# Patient Record
Sex: Male | Born: 1935 | Race: Black or African American | Hispanic: No | Marital: Married | Smoking: Never smoker
Health system: Southern US, Community
[De-identification: ages and names within clinical notes are randomized; demographics above are authoritative.]

## PROBLEM LIST (undated history)

## (undated) DIAGNOSIS — F0390 Unspecified dementia without behavioral disturbance: Secondary | ICD-10-CM

---

## 2017-10-05 ENCOUNTER — Encounter: Payer: Self-pay | Admitting: Internal Medicine

## 2020-07-08 ENCOUNTER — Ambulatory Visit: Payer: Medicare HMO | Admitting: Neurology

## 2020-08-28 ENCOUNTER — Ambulatory Visit: Payer: Medicare HMO | Admitting: Neurology

## 2021-03-14 ENCOUNTER — Emergency Department (HOSPITAL_COMMUNITY): Payer: Medicare HMO

## 2021-03-14 ENCOUNTER — Other Ambulatory Visit: Payer: Self-pay

## 2021-03-14 ENCOUNTER — Emergency Department (HOSPITAL_COMMUNITY)
Admission: EM | Admit: 2021-03-14 | Discharge: 2021-03-14 | Discharge status: Against Medical Advice | Payer: Medicare HMO | Attending: Emergency Medicine | Admitting: Emergency Medicine

## 2021-03-14 ENCOUNTER — Encounter (HOSPITAL_COMMUNITY): Payer: Self-pay

## 2021-03-14 DIAGNOSIS — Z20822 Contact with and (suspected) exposure to covid-19: Secondary | ICD-10-CM | POA: Diagnosis not present

## 2021-03-14 DIAGNOSIS — R55 Syncope and collapse: Secondary | ICD-10-CM | POA: Insufficient documentation

## 2021-03-14 DIAGNOSIS — R031 Nonspecific low blood-pressure reading: Secondary | ICD-10-CM

## 2021-03-14 DIAGNOSIS — Y92009 Unspecified place in unspecified non-institutional (private) residence as the place of occurrence of the external cause: Secondary | ICD-10-CM | POA: Diagnosis not present

## 2021-03-14 DIAGNOSIS — I493 Ventricular premature depolarization: Secondary | ICD-10-CM

## 2021-03-14 DIAGNOSIS — R4182 Altered mental status, unspecified: Secondary | ICD-10-CM | POA: Diagnosis not present

## 2021-03-14 DIAGNOSIS — W19XXXA Unspecified fall, initial encounter: Secondary | ICD-10-CM | POA: Diagnosis not present

## 2021-03-14 DIAGNOSIS — F0390 Unspecified dementia without behavioral disturbance: Secondary | ICD-10-CM | POA: Diagnosis not present

## 2021-03-14 DIAGNOSIS — R7989 Other specified abnormal findings of blood chemistry: Secondary | ICD-10-CM

## 2021-03-14 LAB — COMPREHENSIVE METABOLIC PANEL
ALT: 951 U/L — ABNORMAL HIGH (ref 0–44)
AST: 1997 U/L — ABNORMAL HIGH (ref 15–41)
Albumin: 3 g/dL — ABNORMAL LOW (ref 3.5–5.0)
Alkaline Phosphatase: 72 U/L (ref 38–126)
Anion gap: 6 (ref 5–15)
BUN: 20 mg/dL (ref 8–23)
CO2: 29 mmol/L (ref 22–32)
Calcium: 9.4 mg/dL (ref 8.9–10.3)
Chloride: 104 mmol/L (ref 98–111)
Creatinine, Ser: 1.6 mg/dL — ABNORMAL HIGH (ref 0.61–1.24)
GFR, Estimated: 42 mL/min — ABNORMAL LOW (ref >60)
Glucose, Bld: 111 mg/dL — ABNORMAL HIGH (ref 70–99)
Potassium: 3.9 mmol/L (ref 3.5–5.1)
Sodium: 139 mmol/L (ref 135–145)
Total Bilirubin: 2.2 mg/dL — ABNORMAL HIGH (ref 0.3–1.2)
Total Protein: 5.8 g/dL — ABNORMAL LOW (ref 6.5–8.1)

## 2021-03-14 LAB — CBC
HCT: 39.1 % (ref 39.0–52.0)
Hemoglobin: 12.8 g/dL — ABNORMAL LOW (ref 13.0–17.0)
MCH: 28.7 pg (ref 26.0–34.0)
MCHC: 32.7 g/dL (ref 30.0–36.0)
MCV: 87.7 fL (ref 80.0–100.0)
Platelets: UNDETERMINED 10*3/uL (ref 150–400)
RBC: 4.46 MIL/uL (ref 4.22–5.81)
RDW: 14.2 % (ref 11.5–15.5)
WBC: 5.3 10*3/uL (ref 4.0–10.5)
nRBC: 0 % (ref 0.0–0.2)

## 2021-03-14 LAB — URINALYSIS, ROUTINE W REFLEX MICROSCOPIC
Bacteria, UA: NONE SEEN
Bilirubin Urine: NEGATIVE
Glucose, UA: NEGATIVE mg/dL
Ketones, ur: NEGATIVE mg/dL
Leukocytes,Ua: NEGATIVE
Nitrite: NEGATIVE
Protein, ur: NEGATIVE mg/dL
Specific Gravity, Urine: 1.006 (ref 1.005–1.030)
pH: 8 (ref 5.0–8.0)

## 2021-03-14 LAB — RESP PANEL BY RT-PCR (FLU A&B, COVID) ARPGX2
Influenza A by PCR: NEGATIVE
Influenza B by PCR: NEGATIVE
SARS Coronavirus 2 by RT PCR: NEGATIVE

## 2021-03-14 MED ORDER — SODIUM CHLORIDE 0.9 % IV BOLUS
1000.0000 mL | Freq: Once | INTRAVENOUS | Status: AC
  Administered 2021-03-14: 1000 mL via INTRAVENOUS

## 2021-03-14 NOTE — ED Notes (Signed)
PA Badalamente at bedside.

## 2021-03-14 NOTE — ED Notes (Signed)
Attempted having the pt and pt's family sign the electronic AMA form. Pt's spouse refused to sign it.

## 2021-03-14 NOTE — ED Provider Notes (Addendum)
[facility] EMERGENCY DEPARTMENT Provider Note   CSN: [id number] Arrival date & time: 03/14/21  0818     History Chief Complaint  Patient presents with   Syncope   Fall    Edwin [name] is a 87 y.o. male.  Pt with hx dementia, presents after fall at home, +syncopal event. Spouse hear him fall - was walking in other room, grabbed chair, chair tipped over and pt fell. When she got to him he was conscious. EMS was called, when helping patient up, became faint, unresponsive for ~ 1 minute. No seizure activity. By time on monitor, EMS noted multiple pvcs. Pt came to, and mental status reported as c/w baseline per spouse. EMS notes initial bp soft, in mid 80 to mid 90 range - iv was placed, but patient inadvertently pulled out in route. Pt limited historian - dementia - level 5 caveat. Spouse denies any other recent falls or syncope. Denies hx cad, or dysrhythmia. States something similar happened a long time ago 'and he was dehydrated....got better w fluids'. Spouse indicates recent health at baseline/well. Has been eating/drinking. No wt loss. No med use. No fever. No c/o of any pain. No trouble breathing. No cough or uri symptoms. No gu c/o. Nvd.   The history is provided by the patient, the EMS personnel and medical records. The history is limited by the condition of the patient.      History reviewed. No pertinent past medical history.  There are no problems to display for this patient.   History reviewed. No pertinent surgical history.     History reviewed. No pertinent family history.     Home Medications Prior to Admission medications   Not on File    Allergies    Patient has no known allergies.  Review of Systems   Review of Systems  Unable to perform ROS: Dementia  Constitutional:  Negative for fever.  Neurological:  Positive for syncope.  Level 5 caveat, dementia   Physical Exam Updated Vital Signs BP 119/72   Pulse 98   Temp 99.5 F  (37.5 C)   Resp 18   SpO2 99%   Physical Exam Vitals and nursing note reviewed.  Constitutional:      Appearance: Normal appearance. He is well-developed.  HENT:     Head: Atraumatic.     Nose: Nose normal.     Mouth/Throat:     Mouth: Mucous membranes are moist.     Pharynx: Oropharynx is clear.  Eyes:     General: No scleral icterus.    Conjunctiva/sclera: Conjunctivae normal.     Pupils: Pupils are equal, round, and reactive to light.  Neck:     Vascular: No carotid bruit.     Trachea: No tracheal deviation.     Comments: No stiffness or rigidity.  Cardiovascular:     Rate and Rhythm: Normal rate and regular rhythm.     Pulses: Normal pulses.     Heart sounds: Normal heart sounds. No murmur heard.   No friction rub. No gallop.  Pulmonary:     Effort: Pulmonary effort is normal. No accessory muscle usage or respiratory distress.     Breath sounds: Normal breath sounds.  Chest:     Chest wall: No tenderness.  Abdominal:     General: Bowel sounds are normal. There is no distension.     Palpations: Abdomen is soft.     Tenderness: There is no abdominal tenderness.     Comments: No abd  bruising, contusion, pain or tenderness.   Genitourinary:    Comments: No cva tenderness. Musculoskeletal:        General: No swelling.     Cervical back: Normal range of motion and neck supple. No rigidity or tenderness.     Comments: CTLS spine, non tender, aligned, no step off. Good rom bil extremities without pain or focal bony tenderness.   Skin:    General: Skin is warm and dry.     Findings: No rash.  Neurological:     Mental Status: He is alert.     Comments: Awake and alert. No dysarthria. Baseline dementia - indicates pts mental status and functional ability appears c/w baseline. Motor intact bil, stre 5/5. Sens grossly intact.   Psychiatric:        Mood and Affect: Mood normal.    ED Results / Procedures / Treatments   Labs (all labs ordered are listed, but only  abnormal results are displayed) Results for orders placed or performed during the hospital encounter of 03/14/21  Resp Panel by RT-PCR (Flu A&B, Covid) Nasopharyngeal Swab   Specimen: Nasopharyngeal Swab; Nasopharyngeal(NP) swabs in vial transport medium  Result Value Ref Range   SARS Coronavirus 2 by RT PCR NEGATIVE NEGATIVE   Influenza A by PCR NEGATIVE NEGATIVE   Influenza B by PCR NEGATIVE NEGATIVE  CBC  Result Value Ref Range   WBC 5.3 4.0 - 10.5 K/uL   RBC 4.46 4.22 - 5.81 MIL/uL   Hemoglobin 12.8 (L) 13.0 - 17.0 g/dL   HCT 16.0 10.9 - 32.3 %   MCV 87.7 80.0 - 100.0 fL   MCH 28.7 26.0 - 34.0 pg   MCHC 32.7 30.0 - 36.0 g/dL   RDW 55.7 32.2 - 02.5 %   Platelets PLATELET CLUMPS NOTED ON SMEAR, UNABLE TO ESTIMATE 150 - 400 K/uL   nRBC 0.0 0.0 - 0.2 %  Comprehensive metabolic panel  Result Value Ref Range   Sodium 139 135 - 145 mmol/L   Potassium 3.9 3.5 - 5.1 mmol/L   Chloride 104 98 - 111 mmol/L   CO2 29 22 - 32 mmol/L   Glucose, Bld 111 (H) 70 - 99 mg/dL   BUN 20 8 - 23 mg/dL   Creatinine, Ser 4.27 (H) 0.61 - 1.24 mg/dL   Calcium 9.4 8.9 - 06.2 mg/dL   Total Protein 5.8 (L) 6.5 - 8.1 g/dL   Albumin 3.0 (L) 3.5 - 5.0 g/dL   AST 3,762 (H) 15 - 41 U/L   ALT 951 (H) 0 - 44 U/L   Alkaline Phosphatase 72 38 - 126 U/L   Total Bilirubin 2.2 (H) 0.3 - 1.2 mg/dL   GFR, Estimated 42 (L) >60 mL/min   Anion gap 6 5 - 15  Urinalysis, Routine w reflex microscopic Urine, Clean Catch  Result Value Ref Range   Color, Urine YELLOW YELLOW   APPearance CLEAR CLEAR   Specific Gravity, Urine 1.006 1.005 - 1.030   pH 8.0 5.0 - 8.0   Glucose, UA NEGATIVE NEGATIVE mg/dL   Hgb urine dipstick SMALL (A) NEGATIVE   Bilirubin Urine NEGATIVE NEGATIVE   Ketones, ur NEGATIVE NEGATIVE mg/dL   Protein, ur NEGATIVE NEGATIVE mg/dL   Nitrite NEGATIVE NEGATIVE   Leukocytes,Ua NEGATIVE NEGATIVE   RBC / HPF 0-5 0 - 5 RBC/hpf   WBC, UA 0-5 0 - 5 WBC/hpf   Bacteria, UA NONE SEEN NONE SEEN   CT HEAD WO  CONTRAST ( )  Result Date: 03/14/2021 CLINICAL  DATA:  Head trauma.  Status post fall EXAM: CT HEAD WITHOUT CONTRAST TECHNIQUE: Contiguous axial images were obtained from the base of the skull through the vertex without intravenous contrast. COMPARISON:  None FINDINGS: Brain: No evidence of acute infarction, hemorrhage, hydrocephalus, extra-axial collection or mass lesion/mass effect. Prominence of the sulci and ventricles identified compatible with brain atrophy. There is mild diffuse low-attenuation within the subcortical and periventricular white matter compatible with chronic microvascular disease. Vascular: No hyperdense vessel or unexpected calcification. Skull: Normal. Negative for fracture or focal lesion. Sinuses/Orbits: No acute finding. Other: None. IMPRESSION: 1. No acute intracranial abnormalities. 2. Chronic small vessel ischemic change and brain atrophy. Electronically Signed   By: Signa Kell M.D.   On: 03/14/2021 13:37    EKG EKG Interpretation  Date/Time:  Friday March 14 2021 08:29:56 EDT Ventricular Rate:  103 PR Interval:  182 QRS Duration: 87 QT Interval:  347 QTC Calculation: 455 R Axis:   0 Text Interpretation: Sinus tachycardia No previous tracing Confirmed by Cathren Laine ([id number]) on 03/14/2021 8:47:55 AM  Radiology CT HEAD WO CONTRAST ( )  Result Date: 03/14/2021 CLINICAL DATA:  Head trauma.  Status post fall EXAM: CT HEAD WITHOUT CONTRAST TECHNIQUE: Contiguous axial images were obtained from the base of the skull through the vertex without intravenous contrast. COMPARISON:  None FINDINGS: Brain: No evidence of acute infarction, hemorrhage, hydrocephalus, extra-axial collection or mass lesion/mass effect. Prominence of the sulci and ventricles identified compatible with brain atrophy. There is mild diffuse low-attenuation within the subcortical and periventricular white matter compatible with chronic microvascular disease. Vascular: No hyperdense vessel or  unexpected calcification. Skull: Normal. Negative for fracture or focal lesion. Sinuses/Orbits: No acute finding. Other: None. IMPRESSION: 1. No acute intracranial abnormalities. 2. Chronic small vessel ischemic change and brain atrophy. Electronically Signed   By: Signa Kell M.D.   On: 03/14/2021 13:37    Procedures Procedures   Medications Ordered in ED Medications  sodium chloride 0.9 % bolus 1,000 mL (0 mLs Intravenous Stopped 03/14/21 1104)    ED Course  I have reviewed the triage vital signs and the nursing notes.  Pertinent labs & imaging results that were available during my care of the patient were reviewed by me and considered in my medical decision making (see chart for details).    MDM Rules/Calculators/A&P                           Iv ns bolus. Continuous pulse ox and cardiac monitoring. Ecg. Stat labs.   Reviewed nursing notes and prior charts for additional history.   Labs reviewed/interpreted by me - AST/ALT markedly high. No hx same. Denies hx etoh abuse, denies acetaminophen/tylenol use, denies abd pain or nv, denies hx liver disease/hepatitis.  Will add full liver function panel, hepatitis labs, get u/s.  CT reviewed/interpreted by me - no hem.   Given syncope x 2, weakness/soft bp, elevated lfts, will admit to medicine service.  Medicine consulted for admission.   Discussed plan of admission w pt/spouse,  - pt/spouse indicates not willing to stay in ER, hospital or bed admitted, and requests d/c. I discussed reasons for recommending and needing admission, including syncope x 2, earlier frequent pvcs and soft blood pressure, and possibility cardiac cause of syncope, and risk of injury, and/or death. I also discussed grossly abnormal LFTS with pt/spouse, and risk of liver failure, hepatitis, cholecystitis or other infection, and worsening liver failure, infection, or death. Spouse indicates her  understanding of these issues, states given his age, condition,  dementia, etc, does not want longer ER stay or admission. She indicates he/they will follow up with primary care doctor. Encourage if reconsiders and is willing or wanting to be admitted to return right away. Pt/spouse requesting to leave/AMA.      Final Clinical Impression(s) / ED Diagnoses Final diagnoses:  None    Rx / DC Orders ED Discharge Orders     None         Cathren Laine, MD 03/14/21 1351     Cathren Laine, MD 03/14/21 [id number]

## 2021-03-14 NOTE — Discharge Instructions (Addendum)
It was our pleasure to provide your ER care today - we hope that you feel better. Make sure to drink adequate fluids and stay well hydrated.   Please understand that we are recommending admission to the hospital. Given your presentation and test results, there is risk of heart rhythm problem, fainting, falling and injury, worsening  of liver disease/function, infection, and death.  Return if reconsider and are willing to be admitted.   From today's labs, your kidney function test is mildly high (creatinine 1.6), and liver function tests are very high (AST 1997, ALT 951, bili 2.2).  follow up with your doctor (Dr Knox Royalty) today or tomorrow AM - discuss today's symptoms and these results with him.   Return to ER right away if worse, fainting, chest pain, trouble breathing, severe abdominal pain, fevers, or other concern.

## 2021-03-14 NOTE — ED Triage Notes (Signed)
Pt BIB from home by [facility] d/t initial call out as a "respiratory issue" upon their arrival to residence pt was laying in the floor d/t a unwitnessed fall & was lying supine in the floor with his head against the wall. Wife reports to EMS that she did not want to bring pt to hospital so that he will not "get covid." EMS reports that pt had a syncopal episode when they were getting him out of the floor. BP was 86/55 (65) & pulses 105 during this event, EMS reports he hada left sided gaze, drooling & they were unable to find a radial pulse. The syncopal episode lasted 30-40 seconds on scene (per EMS)While en route to ED pt pulled out his 18G PIV in his Lt FA after he was able to receive 500 cc NS blous, his BP did come up to 97/56 (70) & pulse 102. Upon arrival to ED pt is back to baseline, wife at bedside. CBG 140.

## 2021-06-07 ENCOUNTER — Other Ambulatory Visit: Payer: Self-pay

## 2021-06-07 ENCOUNTER — Emergency Department (HOSPITAL_COMMUNITY): Payer: Medicare HMO

## 2021-06-07 ENCOUNTER — Encounter (HOSPITAL_COMMUNITY): Payer: Self-pay

## 2021-06-07 ENCOUNTER — Emergency Department (HOSPITAL_COMMUNITY)
Admission: EM | Admit: 2021-06-07 | Discharge: 2021-06-07 | Disposition: A | Discharge status: Home / Self Care | Payer: Medicare HMO | Attending: Emergency Medicine | Admitting: Emergency Medicine

## 2021-06-07 DIAGNOSIS — F0390 Unspecified dementia without behavioral disturbance: Secondary | ICD-10-CM | POA: Diagnosis not present

## 2021-06-07 DIAGNOSIS — R464 Slowness and poor responsiveness: Secondary | ICD-10-CM | POA: Diagnosis not present

## 2021-06-07 DIAGNOSIS — Z5321 Procedure and treatment not carried out due to patient leaving prior to being seen by health care provider: Secondary | ICD-10-CM | POA: Diagnosis not present

## 2021-06-07 DIAGNOSIS — R55 Syncope and collapse: Secondary | ICD-10-CM | POA: Diagnosis not present

## 2021-06-07 MED ORDER — IOHEXOL 350 MG/ML SOLN
80.0000 mL | Freq: Once | INTRAVENOUS | Status: AC | PRN
  Administered 2021-06-07: 80 mL via INTRAVENOUS

## 2021-06-07 NOTE — ED Provider Notes (Cosign Needed Addendum)
Emergency Medicine Provider Triage Evaluation Note  Edwin [name] , a 87 y.o. male  was evaluated in triage.  Pt complains of syncope.  Happened previously few weeks ago as well this evening.  Today was on  toilet having a bowel movement, found unresponsive.  When fire got there patient was believed to pulseless? Upon EMS arrival patient had pulses however was hypotensive into the 70s systolic. Given IVF. No chronic medical problems per family.  No melena or blood per rectum.  He is at his baseline mentation.  Patient denies any pain or complaints.  No recent illnesses per wife.  No HA, CP, SOB, back pain, weakness  Seen for syncope with concern for cardiac etiology however declined admission at that time and left AMA  Review of Systems  Positive: syncope Negative: HA, CP, SOB, bloody stool  Physical Exam  SpO2 95%  Gen:   Awake, no distress   Resp:  Normal effort  MSK:   Moves extremities without difficulty  Other:    Medical Decision Making  Medically screening exam initiated at 10:35 PM.  Appropriate orders placed.  Raul Torrance was informed that the remainder of the evaluation will be completed by another provider, this initial triage assessment does not replace that evaluation, and the importance of remaining in the ED until their evaluation is complete.  Syncope  Nursing aware patient needs room in back   ADDEND: Family stating they are leaving against medical advice.  They do not want any work-up done.  Understand risk versus leaving to include death however continue to choose to leave the emergency department. They state they did not want patient brought here by EMS to begin with.  We discussed the nature and purpose, risks and benefits, as well as, the alternatives of treatment. Time was given to allow the opportunity to ask questions and consider their options, and after the discussion, the patient decided to refuse the offerred treatment. The patient was informed that  refusal could lead to, but was not limited to, death, permanent disability, or severe pain. If present, I asked the relatives or significant others to dissuade them without success. Prior to refusing, I determined that the patient had the capacity to make their decision and understood the consequences of that decision. After refusal, I made every reasonable opportunity to treat them to the best of my ability.  The patient was notified that they may return to the emergency department at any time for further treatment.       Jaelyne Deeg A, PA-C 06/07/21 2256

## 2021-06-07 NOTE — ED Notes (Addendum)
Pt family members refusing blood work, EKG and other diagnostics. They sts he has had this happen before. Insurance will not cover repeat blood work. Family report they will take him to [facility] and see what they say. Family educated on significance that this is not the first occurrence and  risks / benefits of care explained. Family request IV be removed and assistance be provided to get him in the car. Provider made aware. AMA paperwork provided prior to departure.

## 2021-06-07 NOTE — ED Triage Notes (Signed)
Pt arrives EMS from home after 1 syncopal episode while sitting on commode. Upon fire arrival pt was unresponsive on toilet. Moved to an adjacent room where they were unable to find radial pulses. Pt breathing on his own. Initial bp was 90/40, and then sat up bp was 70/38. IV inserted and NS administered. Hx dementia.

## 2021-06-07 NOTE — ED Notes (Signed)
Pt seen ambulating in hallways with family member.

## 2021-06-07 NOTE — ED Notes (Signed)
Pt family is refusing EKG, blood work, and x-rays

## 2021-06-07 NOTE — ED Notes (Signed)
Pt spouse refused to sign AMA paperwork.

## 2021-07-26 ENCOUNTER — Emergency Department (HOSPITAL_COMMUNITY): Payer: Medicare HMO

## 2021-07-26 ENCOUNTER — Inpatient Hospital Stay (HOSPITAL_COMMUNITY)
Admission: EM | Admit: 2021-07-26 | Discharge: 2021-08-01 | DRG: 521 | Disposition: A | Discharge status: Skilled Nursing Facility | Payer: Medicare HMO | Attending: Internal Medicine | Admitting: Internal Medicine

## 2021-07-26 ENCOUNTER — Other Ambulatory Visit: Payer: Self-pay

## 2021-07-26 ENCOUNTER — Encounter (HOSPITAL_COMMUNITY): Payer: Self-pay

## 2021-07-26 DIAGNOSIS — E440 Moderate protein-calorie malnutrition: Secondary | ICD-10-CM | POA: Diagnosis not present

## 2021-07-26 DIAGNOSIS — Z820 Family history of epilepsy and other diseases of the nervous system: Secondary | ICD-10-CM | POA: Diagnosis not present

## 2021-07-26 DIAGNOSIS — Z20822 Contact with and (suspected) exposure to covid-19: Secondary | ICD-10-CM | POA: Diagnosis present

## 2021-07-26 DIAGNOSIS — E43 Unspecified severe protein-calorie malnutrition: Secondary | ICD-10-CM | POA: Diagnosis present

## 2021-07-26 DIAGNOSIS — F0390 Unspecified dementia without behavioral disturbance: Secondary | ICD-10-CM | POA: Diagnosis not present

## 2021-07-26 DIAGNOSIS — F03C18 Unspecified dementia, severe, with other behavioral disturbance: Secondary | ICD-10-CM | POA: Diagnosis present

## 2021-07-26 DIAGNOSIS — S72001A Fracture of unspecified part of neck of right femur, initial encounter for closed fracture: Secondary | ICD-10-CM | POA: Diagnosis present

## 2021-07-26 DIAGNOSIS — Z9183 Wandering in diseases classified elsewhere: Secondary | ICD-10-CM | POA: Diagnosis not present

## 2021-07-26 DIAGNOSIS — S7291XA Unspecified fracture of right femur, initial encounter for closed fracture: Secondary | ICD-10-CM | POA: Diagnosis not present

## 2021-07-26 DIAGNOSIS — S72009A Fracture of unspecified part of neck of unspecified femur, initial encounter for closed fracture: Secondary | ICD-10-CM

## 2021-07-26 DIAGNOSIS — Z09 Encounter for follow-up examination after completed treatment for conditions other than malignant neoplasm: Secondary | ICD-10-CM

## 2021-07-26 DIAGNOSIS — Z681 Body mass index (BMI) 19 or less, adult: Secondary | ICD-10-CM

## 2021-07-26 DIAGNOSIS — W1830XA Fall on same level, unspecified, initial encounter: Secondary | ICD-10-CM | POA: Diagnosis present

## 2021-07-26 DIAGNOSIS — S72011A Unspecified intracapsular fracture of right femur, initial encounter for closed fracture: Secondary | ICD-10-CM | POA: Diagnosis present

## 2021-07-26 DIAGNOSIS — Z9181 History of falling: Secondary | ICD-10-CM

## 2021-07-26 LAB — CBC WITH DIFFERENTIAL/PLATELET
Abs Immature Granulocytes: 0.07 10*3/uL (ref 0.00–0.07)
Basophils Absolute: 0 10*3/uL (ref 0.0–0.1)
Basophils Relative: 0 %
Eosinophils Absolute: 0 10*3/uL (ref 0.0–0.5)
Eosinophils Relative: 0 %
HCT: 41 % (ref 39.0–52.0)
Hemoglobin: 13.7 g/dL (ref 13.0–17.0)
Immature Granulocytes: 1 %
Lymphocytes Relative: 4 %
Lymphs Abs: 0.4 10*3/uL — ABNORMAL LOW (ref 0.7–4.0)
MCH: 29 pg (ref 26.0–34.0)
MCHC: 33.4 g/dL (ref 30.0–36.0)
MCV: 86.7 fL (ref 80.0–100.0)
Monocytes Absolute: 0.7 10*3/uL (ref 0.1–1.0)
Monocytes Relative: 7 %
Neutro Abs: 8.1 10*3/uL — ABNORMAL HIGH (ref 1.7–7.7)
Neutrophils Relative %: 88 %
Platelets: 128 10*3/uL — ABNORMAL LOW (ref 150–400)
RBC: 4.73 MIL/uL (ref 4.22–5.81)
RDW: 14.4 % (ref 11.5–15.5)
WBC: 9.3 10*3/uL (ref 4.0–10.5)
nRBC: 0 % (ref 0.0–0.2)

## 2021-07-26 LAB — ABO/RH: ABO/RH(D): O POS

## 2021-07-26 LAB — TYPE AND SCREEN
ABO/RH(D): O POS
Antibody Screen: NEGATIVE

## 2021-07-26 LAB — BASIC METABOLIC PANEL
Anion gap: 4 — ABNORMAL LOW (ref 5–15)
BUN: 18 mg/dL (ref 8–23)
CO2: 29 mmol/L (ref 22–32)
Calcium: 9.4 mg/dL (ref 8.9–10.3)
Chloride: 109 mmol/L (ref 98–111)
Creatinine, Ser: 1.09 mg/dL (ref 0.61–1.24)
GFR, Estimated: >60 mL/min (ref >60)
Glucose, Bld: 114 mg/dL — ABNORMAL HIGH (ref 70–99)
Potassium: 3.9 mmol/L (ref 3.5–5.1)
Sodium: 142 mmol/L (ref 135–145)

## 2021-07-26 LAB — PROTIME-INR
INR: 1.3 — ABNORMAL HIGH (ref 0.8–1.2)
Prothrombin Time: 15.7 seconds — ABNORMAL HIGH (ref 11.4–15.2)

## 2021-07-26 LAB — RESP PANEL BY RT-PCR (FLU A&B, COVID) ARPGX2
Influenza A by PCR: NEGATIVE
Influenza B by PCR: NEGATIVE
SARS Coronavirus 2 by RT PCR: NEGATIVE

## 2021-07-26 MED ORDER — ENOXAPARIN SODIUM 40 MG/0.4ML IJ SOSY: 40.0000 mg | PREFILLED_SYRINGE | INTRAMUSCULAR | Status: DC

## 2021-07-26 MED ORDER — ACETAMINOPHEN 650 MG RE SUPP: 650.0000 mg | Freq: Four times a day (QID) | RECTAL | Status: DC | PRN

## 2021-07-26 MED ORDER — SODIUM CHLORIDE 0.9% FLUSH
3.0000 mL | Freq: Two times a day (BID) | INTRAVENOUS | Status: DC
  Administered 2021-07-28 – 2021-07-29 (×2): 3 mL via INTRAVENOUS

## 2021-07-26 MED ORDER — MORPHINE SULFATE (PF) 2 MG/ML IV SOLN: 2.0000 mg | INTRAVENOUS | Status: DC | PRN

## 2021-07-26 MED ORDER — FENTANYL CITRATE PF 50 MCG/ML IJ SOSY
50.0000 ug | PREFILLED_SYRINGE | INTRAMUSCULAR | Status: DC | PRN
  Filled 2021-07-26: qty 1

## 2021-07-26 MED ORDER — ACETAMINOPHEN 325 MG PO TABS: 650.0000 mg | ORAL_TABLET | Freq: Four times a day (QID) | ORAL | Status: DC | PRN

## 2021-07-26 MED ORDER — ONDANSETRON HCL 4 MG/2ML IJ SOLN
4.0000 mg | Freq: Once | INTRAMUSCULAR | Status: DC
  Filled 2021-07-26: qty 2

## 2021-07-26 NOTE — Progress Notes (Signed)
Patient with right hip fracture, nonsurgical options would include palliative care and pain control, basically being bedbound.  Surgical options would include right hip hemiarthroplasty.  The fracture looks varus, and displaced, and I do not think he would do well with a pinning.  I have discussed with the family, they are currently inclined towards nonsurgical management, but feel that they need at least a couple of days to be able to make this decision.  We will continue to follow, full consult to follow.  I have given the family my cell phone number that they can contact me while I am on call if necessary, or if they come to any definitive determination.  I discussed the risks benefits and alternatives of both nonsurgical and surgical options.  Teryl Lucy, MD.

## 2021-07-26 NOTE — Progress Notes (Signed)
Patient waiting in ED to be transferred to floor. SBAR looked at and signed off. Awaiting for [facility] to find safety sitter as per order. Discussed with ED RN. Will notify them when/if sitter available prior to transferring pt. If none available, asking them to wait to transfer until after bedside report to better watch patient.

## 2021-07-26 NOTE — Assessment & Plan Note (Addendum)
-  Continue delirium precautions as well

## 2021-07-26 NOTE — ED Notes (Signed)
Awaiting for sitter as per orders. Discussed with ED RN

## 2021-07-26 NOTE — ED Provider Notes (Signed)
[facility] DEPT Provider Note   CSN: [id number] Arrival date & time: 07/26/21  1347     History  Chief Complaint  Patient presents with   Hip Injury    Edwin [name] is a 88 y.o. male.  HPI     88 year old male comes in with chief complaint of hip injury. Patient has severe dementia, patient's wife has POA and is providing collateral history.  She alleges that patient was attempting to use the bathroom last night and had a fall.  They were in [place] at that time.  This morning he was having difficulty bearing weight.  Family was able to get patient in the car, they went to [facility] where x-ray was completed and there was concerns for fracture.  Patient was advised to come to the ER.  She denies any loss of consciousness.  She does not think patient had struck his head.  Patient is not on any blood thinners.  Patient has been acting his usual.   Home Medications Prior to Admission medications   Not on File      Allergies    Patient has no known allergies.    Review of Systems   Review of Systems  Unable to perform ROS: Dementia   Physical Exam Updated Vital Signs BP 131/85    Pulse 86    Temp 98.5 F (36.9 C) (Oral)    Resp 18    SpO2 97%  Physical Exam Vitals and nursing note reviewed.  Constitutional:      Appearance: He is well-developed.  HENT:     Head: Atraumatic.  Cardiovascular:     Rate and Rhythm: Normal rate.  Pulmonary:     Effort: Pulmonary effort is normal.  Musculoskeletal:        General: Tenderness present.     Cervical back: Neck supple.     Comments: Tenderness over the right hip, 1+ dorsalis pedis  Skin:    General: Skin is warm.     Coloration: Skin is not pale.     Findings: No erythema.  Neurological:     Mental Status: He is alert and oriented to person, place, and time.    ED Results / Procedures / Treatments   Labs (all labs ordered are listed, but only abnormal results are  displayed) Labs Reviewed  BASIC METABOLIC PANEL - Abnormal; Notable for the following components:      Result Value   Glucose, Bld 114 (*)    Anion gap 4 (*)    All other components within normal limits  CBC WITH DIFFERENTIAL/PLATELET - Abnormal; Notable for the following components:   Platelets 128 (*)    Neutro Abs 8.1 (*)    Lymphs Abs 0.4 (*)    All other components within normal limits  PROTIME-INR - Abnormal; Notable for the following components:   Prothrombin Time 15.7 (*)    INR 1.3 (*)    All other components within normal limits  RESP PANEL BY RT-PCR (FLU A&B, COVID) [id number]  TYPE AND SCREEN  ABO/RH    EKG None  Radiology DG Chest Port 1 View  Result Date: 07/26/2021 CLINICAL DATA:  Preoperative chest radiograph prior to RIGHT hip surgery. EXAM: PORTABLE CHEST 1 VIEW COMPARISON:  None FINDINGS: Cardiomediastinal silhouette is within normal limits. There is no evidence of focal airspace disease, pulmonary edema, suspicious pulmonary nodule/mass, pleural effusion, or pneumothorax. No acute bony abnormalities are identified. Degenerative changes in the shoulders are noted. IMPRESSION: No  active disease. Electronically Signed   By: Margarette Canada M.D.   On: 07/26/2021 15:51   DG Hip Unilat W or Wo Pelvis 2-3 Views Right  Result Date: 07/26/2021 CLINICAL DATA:  Trauma, fall EXAM: DG HIP (WITH OR WITHOUT PELVIS) 2-3V RIGHT COMPARISON:  None. FINDINGS: There is impacted fracture in the subcapital portion of neck of right femur. There is no dislocation. Degenerative changes with small bony spurs are noted in right hip. IMPRESSION: Recent impacted fracture is seen in the subcapital portion of neck of right femur. There is no dislocation. Degenerative changes with small bony spurs seen in the right hip. Electronically Signed   By: Elmer Picker M.D.   On: 07/26/2021 15:19    Procedures Procedures    Medications Ordered in ED Medications  fentaNYL (SUBLIMAZE) injection 50 mcg  (50 mcg Intravenous Patient Refused/Not Given 07/26/21 1544)  ondansetron (ZOFRAN) injection 4 mg (4 mg Intravenous Patient Refused/Not Given 07/26/21 1544)    ED Course/ Medical Decision Making/ A&P Clinical Course as of 07/26/21 1734  Sat Jul 26, 2021  1444 DG Hip Unilat W or Wo Pelvis 2-3 Views Right [MH]  1508 DG Hip Unilat W or Wo Pelvis 2-3 Views Right [MH]    Clinical Course User Index [MH] Lawerance Bach                           Medical Decision Making Amount and/or Complexity of Data Reviewed Labs: ordered. Radiology: ordered.  Risk Prescription drug management. Decision regarding hospitalization.  This patient presents to the ED with chief complaint(s) of fall with pertinent past medical history of dementia which further complicates the presenting complaint. The complaint involves an extensive differential diagnosis and treatment options and also carries with it a high risk of complications and morbidity.    The differential diagnosis includes : Hip fracture, brain bleed, pneumothorax  The initial plan is to : Get basic labs, x-ray of the hip and chest.  Patient's fall occurred at 1 AM.  He is not on any blood thinners.  He has not had any change in mental status.  Wife does not think patient needs a CT scan of his brain or C-spine.  Informed her that possibility of clinically significant injury is low, however given patient's dementia and him being a poor historian -there is a small possibility that there could be injury to the brain or C-spine which patient is not communicating about.  For now, patient's wife does not think he needs further advanced imaging.  She also indicated that she does not want her husband to get surgical fixation.  It appears to be too much for a small fracture.  Patient normally is able to ambulate half a mile a day.  We have advised her that orthopedic surgery team will see him and discussed the findings along with management  treatment options with her.  They will also discuss pros and cons of each approach and she can make an informed decision after consulting with them.   Additional history obtained: Additional history obtained from spouse   - Imaging that was independently visualized with scope of interpretation limited to determining acute life threatening conditions related to emergency care: X-ray of the right hip, which revealed clear evidence of femoral head/neck fracture.  - Consulted or discussed management/test interpretation w/ external professional: Dr. Mardelle Matte, orthopedic surgery.  They will come and see the patient and discussed the treatment options.  We will  proceed with request for admission   Final Clinical Impression(s) / ED Diagnoses Final diagnoses:  Closed fracture of hip, unspecified laterality, initial encounter [facility])    Rx / DC Orders ED Discharge Orders     None         Varney Biles, MD 07/26/21 1734

## 2021-07-26 NOTE — H&P (Signed)
History and Physical    Patient: Edwin [name] [id number] DOB: 11-Oct-1933 DOA: 07/26/2021 DOS: the patient was seen and examined on 07/26/2021 PCP: Pcp, No  Patient coming from: Home  Chief Complaint:  Chief Complaint  Patient presents with   Hip Injury    HPI:  Edwin [name] is an 88 yo male with PMH dementia who presented to the ER after a mechanical fall.  History is provided by his wife in the ER as patient cannot provide collateral information due to his dementia.  They were in [place] in a hotel and upon getting out of bed in the middle of the night, he had a fall.  She immediately brought him back home to [facility] to the hospital for evaluation. X-rays were obtained which showed impacted fracture involving subcapital portion of the neck of the right femur. Orthopedic surgery was consulted and wife was amenable for surgical repair. Typically patient is ambulatory on a normal basis and can go up a flight of stairs without difficulty.  Other than his dementia he has no other major medical problems and takes no daily medications.   Review of Systems: unable to review all systems due to the inability of the patient to answer questions. History reviewed. No pertinent past medical history. History reviewed. No pertinent surgical history. Social History:  reports that he has never smoked. He has never used smokeless tobacco. He reports that he does not currently use alcohol. He reports that he does not currently use drugs.  No Known Allergies  Family History  Problem Relation Age of Onset   Dementia Sister    Dementia Brother     Prior to Admission medications   Not on File    Physical Exam: Vitals:   07/26/21 1545 07/26/21 1600 07/26/21 1630 07/26/21 1730  BP: (!) 132/101 (!) 143/115 (!) 143/115 131/85  Pulse:  (!) 102 (!) 102 86  Resp:   18 18  Temp:      TempSrc:      SpO2:  (!) 73% 92% 97%   Physical Exam Constitutional:      General: He is not in acute  distress.    Appearance: Normal appearance.  HENT:     Head: Normocephalic and atraumatic.     Mouth/Throat:     Mouth: Mucous membranes are moist.  Eyes:     Extraocular Movements: Extraocular movements intact.  Cardiovascular:     Rate and Rhythm: Normal rate and regular rhythm.     Heart sounds: Normal heart sounds.  Pulmonary:     Effort: Pulmonary effort is normal. No respiratory distress.     Breath sounds: Normal breath sounds. No wheezing.  Abdominal:     General: Bowel sounds are normal. There is no distension.     Palpations: Abdomen is soft.     Tenderness: There is no abdominal tenderness.  Musculoskeletal:     Cervical back: Normal range of motion and neck supple.     Comments: RLE deferred due to fracture  Skin:    General: Skin is warm and dry.  Neurological:     Mental Status: He is alert.     Comments: Oriented to first name only. Dementia appreciated  Psychiatric:        Mood and Affect: Mood normal.        Behavior: Behavior normal.     Data Reviewed:  Results for orders placed or performed during the hospital encounter of 07/26/21 (from the past 24 hour(s))  Basic metabolic panel  Status: Abnormal   Collection Time: 07/26/21  3:44 PM  Result Value Ref Range   Sodium 142 135 - 145 mmol/L   Potassium 3.9 3.5 - 5.1 mmol/L   Chloride 109 98 - 111 mmol/L   CO2 29 22 - 32 mmol/L   Glucose, Bld 114 (H) 70 - 99 mg/dL   BUN 18 8 - 23 mg/dL   Creatinine, Ser 1.09 0.61 - 1.24 mg/dL   Calcium 9.4 8.9 - 10.3 mg/dL   GFR, Estimated >60 >60 mL/min   Anion gap 4 (L) 5 - 15  CBC with Differential     Status: Abnormal   Collection Time: 07/26/21  3:44 PM  Result Value Ref Range   WBC 9.3 4.0 - 10.5 K/uL   RBC 4.73 4.22 - 5.81 MIL/uL   Hemoglobin 13.7 13.0 - 17.0 g/dL   HCT 41.0 39.0 - 52.0 %   MCV 86.7 80.0 - 100.0 fL   MCH 29.0 26.0 - 34.0 pg   MCHC 33.4 30.0 - 36.0 g/dL   RDW 14.4 11.5 - 15.5 %   Platelets 128 (L) 150 - 400 K/uL   nRBC 0.0 0.0 - 0.2  %   Neutrophils Relative % 88 %   Neutro Abs 8.1 (H) 1.7 - 7.7 K/uL   Lymphocytes Relative 4 %   Lymphs Abs 0.4 (L) 0.7 - 4.0 K/uL   Monocytes Relative 7 %   Monocytes Absolute 0.7 0.1 - 1.0 K/uL   Eosinophils Relative 0 %   Eosinophils Absolute 0.0 0.0 - 0.5 K/uL   Basophils Relative 0 %   Basophils Absolute 0.0 0.0 - 0.1 K/uL   Immature Granulocytes 1 %   Abs Immature Granulocytes 0.07 0.00 - 0.07 K/uL  Protime-INR     Status: Abnormal   Collection Time: 07/26/21  3:44 PM  Result Value Ref Range   Prothrombin Time 15.7 (H) 11.4 - 15.2 seconds   INR 1.3 (H) 0.8 - 1.2  Type and screen [place]     Status: None   Collection Time: 07/26/21  3:44 PM  Result Value Ref Range   ABO/RH(D) O POS    Antibody Screen NEG    Sample Expiration      07/29/2021,2359 Performed at [facility], [place] [address], [place], [place] [address]   Resp Panel by RT-PCR (Flu A&B, Covid) Nasopharyngeal Swab     Status: None   Collection Time: 07/26/21  4:11 PM   Specimen: Nasopharyngeal Swab; Nasopharyngeal(NP) swabs in vial transport medium  Result Value Ref Range   SARS Coronavirus 2 by RT PCR NEGATIVE NEGATIVE   Influenza A by PCR NEGATIVE NEGATIVE   Influenza B by PCR NEGATIVE NEGATIVE    I have Reviewed nursing notes, Vitals, and Lab results since pt's last encounter. Pertinent lab results : see above I have ordered test including BMP, CBC, Mg I have reviewed the last note from staff over past 24 hours I have discussed pt's care plan and test results with nursing staff, case manager   Assessment and Plan: * Closed right hip fracture ([place])- (present on admission) - Closed right neck of femur fracture due to mechanical fall - low surgical risk as a whole. No further pre-op clearance indicated at this time. Would suspect he will need rehab after surgery and obvious increased risk of delirium post-op which can all be managed adequately  -Continue pain  control - N.p.o. at midnight - Orthopedic surgery following, tentative plan is for operative  repair on 07/27/2021  Dementia without behavioral disturbance ([place]) - Due to patient getting up and wandering at night when out of town with his wife, concerned he is a higher fall risk especially as no family will be present tonight.  - Oncologist ordered -Continue delirium precautions as well   Advance Care Planning: Full code  Antimicrobials: N/a  Consultants: Orthopedic surgery  Procedures:    DVT ppx:  Lovenox    Consults: Orthopedic surgery  Family Communication: wife  Severity of Illness: The appropriate patient status for this patient is INPATIENT. Inpatient status is judged to be reasonable and necessary in order to provide the required intensity of service to ensure the patient's safety. The patient's presenting symptoms, physical exam findings, and initial radiographic and laboratory data in the context of their chronic comorbidities is felt to place them at high risk for further clinical deterioration. Furthermore, it is not anticipated that the patient will be medically stable for discharge from the hospital within 2 midnights of admission.   * I certify that at the point of admission it is my clinical judgment that the patient will require inpatient hospital care spanning beyond 2 midnights from the point of admission due to high intensity of service, high risk for further deterioration and high frequency of surveillance required.*  Author: Dwyane Dee, MD 07/26/2021 6:11 PM  For on call review [email or web address].

## 2021-07-26 NOTE — Progress Notes (Signed)
Spoke again with family. They have elected for surgery. Plan for right hip hemiarthroplasty in the am with Dr. Eulah Pont.

## 2021-07-26 NOTE — ED Triage Notes (Signed)
Pts family reports he fell this morning and reported hip pain. Pt went to [facility] center and had X-ray that showed right hip fracture. Hx of dementia.

## 2021-07-26 NOTE — Hospital Course (Addendum)
Edwin [name] is an 88 yo male with PMH dementia who presented to the ER after a mechanical fall.  History is provided by his wife in the ER as patient cannot provide collateral information due to his dementia.  They were in [place] in a hotel and upon getting out of bed in the middle of the night, he had a fall.  She immediately brought him back home to [facility] to the hospital for evaluation. X-rays were obtained which showed impacted fracture involving subcapital portion of the neck of the right femur. Orthopedic surgery was consulted and wife was amenable for surgical repair. Typically patient is ambulatory on a normal basis and can go up a flight of stairs without difficulty.  Other than his dementia he has no other major medical problems and takes no daily medications.

## 2021-07-26 NOTE — Assessment & Plan Note (Addendum)
-   Closed right neck of femur fracture due to mechanical fall -Continue pain control - s/p right hemiarthroplasty on 07/27/2021; weightbearing as tolerated to right lower extremity per orthopedic surgery - Continue PT/OT - Continue Lovenox for DVT prophylaxis; plan for asa 81 mg BID x 30 days at discharge if wife is amenable to treatment - wife is particular about certain medications as they practice a "natural" lifestyle free of medications

## 2021-07-26 NOTE — ED Provider Triage Note (Signed)
Emergency Medicine Provider Triage Evaluation Note  Edwin [name] , a 88 y.o. male  was evaluated in triage.  Pt complains of right hip injury onset today prior to arrival.  Patient wife notes that patient was attempting to use the bathroom when he injured his right hip.  He was evaluated at [facility] who completed an x-ray evaluation and noted that there was a fracture.  Patient was referred to the ED for further evaluation.  He has not given any medications for symptoms.  Denies chest pain, shortness of breath, abdominal pain, nausea, vomiting.  Review of Systems  Positive: As per HPI above Negative: As per HPI above  Physical Exam  BP 113/68 (BP Location: Left Arm)    Pulse 97    Temp 98.5 F (36.9 C) (Oral)    Resp 18    SpO2 100%  Gen:   Awake, no distress   Resp:  Normal effort  MSK:   Patient with right leg extended for comfort.  No tenderness to palpation noted to right lower extremity.  Pedal pulses intact bilaterally.  Medical Decision Making  Medically screening exam initiated at 2:15 PM.  Appropriate orders placed.  Edwin [name] was informed that the remainder of the evaluation will be completed by another provider, this initial triage assessment does not replace that evaluation, and the importance of remaining in the ED until their evaluation is complete.   Callie Bunyard A, PA-C 07/26/21 1416

## 2021-07-27 ENCOUNTER — Inpatient Hospital Stay (HOSPITAL_COMMUNITY): Payer: Medicare HMO | Admitting: Anesthesiology

## 2021-07-27 ENCOUNTER — Inpatient Hospital Stay (HOSPITAL_COMMUNITY): Payer: Medicare HMO

## 2021-07-27 ENCOUNTER — Encounter (HOSPITAL_COMMUNITY)
Admission: EM | Disposition: A | Discharge status: Skilled Nursing Facility | Payer: Self-pay | Source: Home / Self Care | Attending: Internal Medicine

## 2021-07-27 DIAGNOSIS — S72001A Fracture of unspecified part of neck of right femur, initial encounter for closed fracture: Secondary | ICD-10-CM | POA: Diagnosis not present

## 2021-07-27 DIAGNOSIS — F0390 Unspecified dementia without behavioral disturbance: Secondary | ICD-10-CM | POA: Diagnosis not present

## 2021-07-27 DIAGNOSIS — S7291XA Unspecified fracture of right femur, initial encounter for closed fracture: Secondary | ICD-10-CM

## 2021-07-27 HISTORY — PX: HIP ARTHROPLASTY: SHX981

## 2021-07-27 LAB — CBC WITH DIFFERENTIAL/PLATELET
Abs Immature Granulocytes: 0.03 10*3/uL (ref 0.00–0.07)
Basophils Absolute: 0 10*3/uL (ref 0.0–0.1)
Basophils Relative: 0 %
Eosinophils Absolute: 0.1 10*3/uL (ref 0.0–0.5)
Eosinophils Relative: 1 %
HCT: 45.9 % (ref 39.0–52.0)
Hemoglobin: 14.5 g/dL (ref 13.0–17.0)
Immature Granulocytes: 0 %
Lymphocytes Relative: 9 %
Lymphs Abs: 0.7 10*3/uL (ref 0.7–4.0)
MCH: 28.9 pg (ref 26.0–34.0)
MCHC: 31.6 g/dL (ref 30.0–36.0)
MCV: 91.4 fL (ref 80.0–100.0)
Monocytes Absolute: 0.6 10*3/uL (ref 0.1–1.0)
Monocytes Relative: 7 %
Neutro Abs: 6.6 10*3/uL (ref 1.7–7.7)
Neutrophils Relative %: 83 %
Platelets: 85 10*3/uL — ABNORMAL LOW (ref 150–400)
RBC: 5.02 MIL/uL (ref 4.22–5.81)
RDW: 15.2 % (ref 11.5–15.5)
WBC: 8 10*3/uL (ref 4.0–10.5)
nRBC: 0 % (ref 0.0–0.2)

## 2021-07-27 LAB — SURGICAL PCR SCREEN
MRSA, PCR: NEGATIVE
Staphylococcus aureus: NEGATIVE

## 2021-07-27 LAB — BASIC METABOLIC PANEL
Anion gap: 4 — ABNORMAL LOW (ref 5–15)
BUN: 13 mg/dL (ref 8–23)
CO2: 29 mmol/L (ref 22–32)
Calcium: 9.6 mg/dL (ref 8.9–10.3)
Chloride: 110 mmol/L (ref 98–111)
Creatinine, Ser: 1.06 mg/dL (ref 0.61–1.24)
GFR, Estimated: >60 mL/min (ref >60)
Glucose, Bld: 89 mg/dL (ref 70–99)
Potassium: 3.7 mmol/L (ref 3.5–5.1)
Sodium: 143 mmol/L (ref 135–145)

## 2021-07-27 LAB — MAGNESIUM: Magnesium: 2.2 mg/dL (ref 1.7–2.4)

## 2021-07-27 SURGERY — HEMIARTHROPLASTY, HIP, DIRECT ANTERIOR APPROACH, FOR FRACTURE
Anesthesia: Monitor Anesthesia Care | Site: Hip | Laterality: Right

## 2021-07-27 MED ORDER — 0.9 % SODIUM CHLORIDE (POUR BTL) OPTIME
TOPICAL | Status: DC | PRN
Start: 2021-07-27 — End: 2021-07-27
  Administered 2021-07-27: 1000 mL

## 2021-07-27 MED ORDER — ACETAMINOPHEN 500 MG PO TABS
500.0000 mg | ORAL_TABLET | Freq: Four times a day (QID) | ORAL | Status: AC
Start: 2021-07-27 — End: 2021-07-28
  Administered 2021-07-28: 500 mg via ORAL
  Filled 2021-07-27 (×2): qty 1

## 2021-07-27 MED ORDER — PROPOFOL 10 MG/ML IV BOLUS
INTRAVENOUS | Status: AC
  Filled 2021-07-27: qty 20

## 2021-07-27 MED ORDER — ONDANSETRON HCL 4 MG/2ML IJ SOLN: 4.0000 mg | Freq: Once | INTRAMUSCULAR | Status: DC | PRN

## 2021-07-27 MED ORDER — ONDANSETRON HCL 4 MG PO TABS: 4.0000 mg | ORAL_TABLET | Freq: Four times a day (QID) | ORAL | Status: DC | PRN

## 2021-07-27 MED ORDER — CEFAZOLIN SODIUM-DEXTROSE 2-4 GM/100ML-% IV SOLN
2.0000 g | Freq: Four times a day (QID) | INTRAVENOUS | Status: AC
  Administered 2021-07-27 (×2): 2 g via INTRAVENOUS
  Filled 2021-07-27 (×2): qty 100

## 2021-07-27 MED ORDER — CHLORHEXIDINE GLUCONATE 4 % EX LIQD: 60.0000 mL | Freq: Once | CUTANEOUS | Status: DC

## 2021-07-27 MED ORDER — DEXMEDETOMIDINE (PRECEDEX) IN NS 20 MCG/5ML (4 MCG/ML) IV SYRINGE: PREFILLED_SYRINGE | INTRAVENOUS | Status: DC | PRN

## 2021-07-27 MED ORDER — FENTANYL CITRATE PF 50 MCG/ML IJ SOSY: 25.0000 ug | PREFILLED_SYRINGE | INTRAMUSCULAR | Status: DC | PRN

## 2021-07-27 MED ORDER — POVIDONE-IODINE 10 % EX SWAB: 2.0000 "application " | Freq: Once | CUTANEOUS | Status: DC

## 2021-07-27 MED ORDER — PHENYLEPHRINE HCL (PRESSORS) 10 MG/ML IV SOLN
INTRAVENOUS | Status: AC
  Filled 2021-07-27: qty 1

## 2021-07-27 MED ORDER — ONDANSETRON HCL 4 MG/2ML IJ SOLN
INTRAMUSCULAR | Status: DC | PRN
  Administered 2021-07-27: 4 mg via INTRAVENOUS

## 2021-07-27 MED ORDER — PROPOFOL 1000 MG/100ML IV EMUL
INTRAVENOUS | Status: AC
  Filled 2021-07-27: qty 100

## 2021-07-27 MED ORDER — LACTATED RINGERS IV SOLN: INTRAVENOUS | Status: DC | PRN

## 2021-07-27 MED ORDER — SODIUM CHLORIDE 0.9 % IV SOLN: INTRAVENOUS | Status: DC | PRN

## 2021-07-27 MED ORDER — FENTANYL CITRATE (PF) 250 MCG/5ML IJ SOLN
INTRAMUSCULAR | Status: DC | PRN
  Administered 2021-07-27: 25 ug via INTRAVENOUS

## 2021-07-27 MED ORDER — BISACODYL 5 MG PO TBEC: 5.0000 mg | DELAYED_RELEASE_TABLET | Freq: Every day | ORAL | Status: DC | PRN

## 2021-07-27 MED ORDER — FENTANYL CITRATE (PF) 250 MCG/5ML IJ SOLN
INTRAMUSCULAR | Status: AC
  Filled 2021-07-27: qty 5

## 2021-07-27 MED ORDER — ACETAMINOPHEN 500 MG PO TABS
1000.0000 mg | ORAL_TABLET | Freq: Once | ORAL | Status: AC
  Administered 2021-07-27: 1000 mg via ORAL

## 2021-07-27 MED ORDER — SODIUM CHLORIDE 0.9% FLUSH
INTRAVENOUS | Status: DC | PRN
  Administered 2021-07-27: 20 mL via INTRAVENOUS

## 2021-07-27 MED ORDER — DEXAMETHASONE SODIUM PHOSPHATE 10 MG/ML IJ SOLN
INTRAMUSCULAR | Status: AC
  Filled 2021-07-27: qty 1

## 2021-07-27 MED ORDER — LIDOCAINE 2% (20 MG/ML) 5 ML SYRINGE
INTRAMUSCULAR | Status: DC | PRN
  Administered 2021-07-27: 20 mg via INTRAVENOUS

## 2021-07-27 MED ORDER — PROPOFOL 500 MG/50ML IV EMUL
INTRAVENOUS | Status: DC | PRN
  Administered 2021-07-27: 40 ug/kg/min via INTRAVENOUS

## 2021-07-27 MED ORDER — DEXAMETHASONE SODIUM PHOSPHATE 10 MG/ML IJ SOLN
INTRAMUSCULAR | Status: DC | PRN
  Administered 2021-07-27: 8 mg via INTRAVENOUS

## 2021-07-27 MED ORDER — ONDANSETRON HCL 4 MG/2ML IJ SOLN
INTRAMUSCULAR | Status: AC
  Filled 2021-07-27: qty 2

## 2021-07-27 MED ORDER — TRANEXAMIC ACID-NACL 1000-0.7 MG/100ML-% IV SOLN
INTRAVENOUS | Status: DC | PRN
  Administered 2021-07-27: 1000 mg via INTRAVENOUS

## 2021-07-27 MED ORDER — HYDROCODONE-ACETAMINOPHEN 5-325 MG PO TABS: 1.0000 | ORAL_TABLET | ORAL | Status: DC | PRN

## 2021-07-27 MED ORDER — MENTHOL 3 MG MT LOZG: 1.0000 | LOZENGE | OROMUCOSAL | Status: DC | PRN

## 2021-07-27 MED ORDER — HYDROCODONE-ACETAMINOPHEN 7.5-325 MG PO TABS
1.0000 | ORAL_TABLET | ORAL | Status: DC | PRN
  Administered 2021-08-01: 1 via ORAL
  Filled 2021-07-27: qty 1

## 2021-07-27 MED ORDER — ONDANSETRON HCL 4 MG/2ML IJ SOLN: 4.0000 mg | Freq: Four times a day (QID) | INTRAMUSCULAR | Status: DC | PRN

## 2021-07-27 MED ORDER — PHENOL 1.4 % MT LIQD: 1.0000 | OROMUCOSAL | Status: DC | PRN

## 2021-07-27 MED ORDER — TRANEXAMIC ACID-NACL 1000-0.7 MG/100ML-% IV SOLN: 1000.0000 mg | INTRAVENOUS | Status: DC

## 2021-07-27 MED ORDER — ACETAMINOPHEN 500 MG PO TABS
ORAL_TABLET | ORAL | Status: AC
  Filled 2021-07-27: qty 2

## 2021-07-27 MED ORDER — SODIUM CHLORIDE (PF) 0.9 % IJ SOLN
INTRAMUSCULAR | Status: AC
  Filled 2021-07-27: qty 10

## 2021-07-27 MED ORDER — MIDAZOLAM HCL 2 MG/2ML IJ SOLN
INTRAMUSCULAR | Status: AC
  Filled 2021-07-27: qty 2

## 2021-07-27 MED ORDER — CEFAZOLIN SODIUM-DEXTROSE 2-3 GM-%(50ML) IV SOLR
INTRAVENOUS | Status: DC | PRN
  Administered 2021-07-27: 2 g via INTRAVENOUS

## 2021-07-27 MED ORDER — CEFAZOLIN SODIUM-DEXTROSE 2-4 GM/100ML-% IV SOLN: 2.0000 g | INTRAVENOUS | Status: DC

## 2021-07-27 MED ORDER — PROPOFOL 10 MG/ML IV BOLUS
INTRAVENOUS | Status: DC | PRN
  Administered 2021-07-27 (×2): 10 mg via INTRAVENOUS

## 2021-07-27 MED ORDER — CEFAZOLIN SODIUM-DEXTROSE 2-4 GM/100ML-% IV SOLN
INTRAVENOUS | Status: AC
  Filled 2021-07-27: qty 100

## 2021-07-27 MED ORDER — BUPIVACAINE LIPOSOME 1.3 % IJ SUSP: 20.0000 mL | Freq: Once | INTRAMUSCULAR | Status: DC

## 2021-07-27 MED ORDER — BUPIVACAINE LIPOSOME 1.3 % IJ SUSP
INTRAMUSCULAR | Status: DC | PRN
  Administered 2021-07-27: 20 mL

## 2021-07-27 MED ORDER — DOCUSATE SODIUM 100 MG PO CAPS
100.0000 mg | ORAL_CAPSULE | Freq: Two times a day (BID) | ORAL | Status: DC
  Administered 2021-07-28 – 2021-08-01 (×4): 100 mg via ORAL
  Filled 2021-07-27 (×5): qty 1

## 2021-07-27 MED ORDER — BUPIVACAINE LIPOSOME 1.3 % IJ SUSP
INTRAMUSCULAR | Status: AC
  Filled 2021-07-27: qty 20

## 2021-07-27 MED ORDER — ENOXAPARIN SODIUM 40 MG/0.4ML IJ SOSY
40.0000 mg | PREFILLED_SYRINGE | INTRAMUSCULAR | Status: DC
  Administered 2021-07-28 – 2021-08-01 (×5): 40 mg via SUBCUTANEOUS
  Filled 2021-07-27 (×5): qty 0.4

## 2021-07-27 MED ORDER — TRANEXAMIC ACID-NACL 1000-0.7 MG/100ML-% IV SOLN
1000.0000 mg | Freq: Once | INTRAVENOUS | Status: AC
  Administered 2021-07-27: 1000 mg via INTRAVENOUS
  Filled 2021-07-27: qty 100

## 2021-07-27 MED ORDER — MORPHINE SULFATE (PF) 2 MG/ML IV SOLN
0.5000 mg | INTRAVENOUS | Status: DC | PRN
  Administered 2021-07-27: 1 mg via INTRAVENOUS
  Filled 2021-07-27: qty 1

## 2021-07-27 MED ORDER — METOCLOPRAMIDE HCL 5 MG PO TABS: 5.0000 mg | ORAL_TABLET | Freq: Three times a day (TID) | ORAL | Status: DC | PRN

## 2021-07-27 MED ORDER — POLYETHYLENE GLYCOL 3350 17 G PO PACK: 17.0000 g | PACK | Freq: Every day | ORAL | Status: DC | PRN

## 2021-07-27 MED ORDER — PHENYLEPHRINE HCL-NACL 20-0.9 MG/250ML-% IV SOLN
INTRAVENOUS | Status: DC | PRN
  Administered 2021-07-27: 30 ug/min via INTRAVENOUS

## 2021-07-27 MED ORDER — TRANEXAMIC ACID-NACL 1000-0.7 MG/100ML-% IV SOLN
INTRAVENOUS | Status: AC
  Filled 2021-07-27: qty 100

## 2021-07-27 MED ORDER — ACETAMINOPHEN 325 MG PO TABS
325.0000 mg | ORAL_TABLET | Freq: Four times a day (QID) | ORAL | Status: DC | PRN
  Administered 2021-07-28 – 2021-07-30 (×4): 650 mg via ORAL
  Filled 2021-07-27 (×4): qty 2

## 2021-07-27 MED ORDER — LIDOCAINE HCL (PF) 2 % IJ SOLN
INTRAMUSCULAR | Status: AC
  Filled 2021-07-27: qty 5

## 2021-07-27 MED ORDER — FENTANYL CITRATE (PF) 100 MCG/2ML IJ SOLN
INTRAMUSCULAR | Status: AC
  Filled 2021-07-27: qty 2

## 2021-07-27 MED ORDER — METOCLOPRAMIDE HCL 5 MG/ML IJ SOLN: 5.0000 mg | Freq: Three times a day (TID) | INTRAMUSCULAR | Status: DC | PRN

## 2021-07-27 MED ORDER — BUPIVACAINE IN DEXTROSE 0.75-8.25 % IT SOLN
INTRATHECAL | Status: DC | PRN
  Administered 2021-07-27: 1.8 mL via INTRATHECAL

## 2021-07-27 SURGICAL SUPPLY — 54 items
BAG COUNTER SPONGE SURGICOUNT (BAG) ×2 IMPLANT
BAG SPNG CNTER NS LX DISP (BAG) ×1
BIT DRILL 2.0X128 (BIT) ×2 IMPLANT
BLADE SAGITTAL 25.0X1.27X90 (BLADE) ×2 IMPLANT
CLSR STERI-STRIP ANTIMIC 1/2X4 (GAUZE/BANDAGES/DRESSINGS) ×3 IMPLANT
COVER SURGICAL LIGHT HANDLE (MISCELLANEOUS) ×2 IMPLANT
DRAPE ORTHO SPLIT 77X108 STRL (DRAPES) ×4
DRAPE POUCH INSTRU U-SHP 10X18 (DRAPES) ×1 IMPLANT
DRAPE SURG ORHT 6 SPLT 77X108 (DRAPES) ×2 IMPLANT
DRAPE U-SHAPE 47X51 STRL (DRAPES) ×2 IMPLANT
DRSG AQUACEL AG ADV 3.5X10 (GAUZE/BANDAGES/DRESSINGS) ×1 IMPLANT
DRSG MEPILEX BORDER 4X8 (GAUZE/BANDAGES/DRESSINGS) ×2 IMPLANT
DURAPREP 26ML APPLICATOR (WOUND CARE) ×2 IMPLANT
ELECT BLADE TIP CTD 4 INCH (ELECTRODE) ×2 IMPLANT
ELECT CAUTERY BLADE TIP 2.5 (TIP) ×2
ELECT REM PT RETURN 15FT ADLT (MISCELLANEOUS) ×2 IMPLANT
ELECTRODE CAUTERY BLDE TIP 2.5 (TIP) ×1 IMPLANT
FACESHIELD WRAPAROUND (MASK) ×2 IMPLANT
FACESHIELD WRAPAROUND OR TEAM (MASK) ×1 IMPLANT
GLOVE SRG 8 PF TXTR STRL LF DI (GLOVE) ×1 IMPLANT
GLOVE SURG ENC MOIS LTX SZ7.5 (GLOVE) ×2 IMPLANT
GLOVE SURG POLYISO LF SZ7.5 (GLOVE) ×2 IMPLANT
GLOVE SURG UNDER POLY LF SZ7.5 (GLOVE) ×2 IMPLANT
GLOVE SURG UNDER POLY LF SZ8 (GLOVE) ×2
GOWN STRL REUS W/TWL LRG LVL3 (GOWN DISPOSABLE) ×4 IMPLANT
GOWN STRL REUS W/TWL XL LVL3 (GOWN DISPOSABLE) ×2 IMPLANT
HEAD MODULAR ENDO (Orthopedic Implant) ×2 IMPLANT
HEAD UNPLR 50XMDLR STRL HIP (Orthopedic Implant) IMPLANT
IMMOBILIZER KNEE 22 UNIV (SOFTGOODS) ×2 IMPLANT
KIT BASIN OR (CUSTOM PROCEDURE TRAY) ×2 IMPLANT
KIT TURNOVER KIT A (KITS) IMPLANT
MANIFOLD NEPTUNE II (INSTRUMENTS) ×2 IMPLANT
NEEDLE HYPO 22GX1.5 SAFETY (NEEDLE) ×1 IMPLANT
NS IRRIG 1000ML POUR BTL (IV SOLUTION) ×2 IMPLANT
PACK TOTAL JOINT (CUSTOM PROCEDURE TRAY) ×2 IMPLANT
PAD ARMBOARD 7.5X6 YLW CONV (MISCELLANEOUS) ×4 IMPLANT
RETRIEVER SUT HEWSON (MISCELLANEOUS) ×2 IMPLANT
SLEEVE UNITRAX (Orthopedic Implant) ×1 IMPLANT
STAPLER VISISTAT 35W (STAPLE) IMPLANT
STEM HIP 4 127DEG (Stem) ×1 IMPLANT
SUT FIBERWIRE #2 38 REV NDL BL (SUTURE) ×4
SUT MNCRL AB 4-0 PS2 18 (SUTURE) ×2 IMPLANT
SUT MON AB 2-0 CT1 36 (SUTURE) ×2 IMPLANT
SUT STRATAFIX 0 PDS 27 VIOLET (SUTURE)
SUT VIC AB 1 CT1 27 (SUTURE) ×2
SUT VIC AB 1 CT1 27XBRD ANBCTR (SUTURE) ×1 IMPLANT
SUT VIC AB 3-0 SH 27 (SUTURE) ×2
SUT VIC AB 3-0 SH 27X BRD (SUTURE) IMPLANT
SUTURE FIBERWR#2 38 REV NDL BL (SUTURE) ×2 IMPLANT
SUTURE STRATFX 0 PDS 27 VIOLET (SUTURE) IMPLANT
SYR 30ML LL (SYRINGE) ×1 IMPLANT
TOWEL OR 17X26 10 PK STRL BLUE (TOWEL DISPOSABLE) ×4 IMPLANT
TOWEL OR NON WOVEN STRL DISP B (DISPOSABLE) ×2 IMPLANT
TRAY FOLEY MTR SLVR 16FR STAT (SET/KITS/TRAYS/PACK) ×1 IMPLANT

## 2021-07-27 NOTE — Op Note (Signed)
07/26/2021 - 07/27/2021  10:38 AM  PATIENT:  Edwin [name]   MRN: [id number]  PRE-OPERATIVE DIAGNOSIS:  RIGHT HIP FRACTURE  POST-OPERATIVE DIAGNOSIS:  * No post-op diagnosis entered *  PROCEDURE:  Procedure(s): ARTHROPLASTY BIPOLAR HIP (HEMIARTHROPLASTY)  PREOPERATIVE INDICATIONS:  Edwin [name] is an 88 y.o. male who was admitted 07/26/2021 with a diagnosis of Closed right hip fracture ([place]) and elected for surgical management.  The risks benefits and alternatives were discussed with the patient including but not limited to the risks of nonoperative treatment, versus surgical intervention including infection, bleeding, nerve injury, periprosthetic fracture, the need for revision surgery, dislocation, leg length discrepancy, blood clots, cardiopulmonary complications, morbidity, mortality, among others, and they were willing to proceed.  Predicted outcome is good, although there will be at least a six to nine month expected recovery.   OPERATIVE REPORT     SURGEON:  Edmonia Lynch, MD    ASSISTANT:  Aggie Moats, PA-C, he was present and scrubbed throughout the case, critical for completion in a timely fashion, and for retraction, instrumentation, and closure.     ANESTHESIA:  General    COMPLICATIONS:  None.      COMPONENTS:  [employer]: Femoral stem: 4, Femoral Head:50, Neck:-4   PROCEDURE IN DETAIL: The patient was met in the holding area and identified.  The appropriate hip  was marked at the operative site. The patient was then transported to the OR and  placed under general anesthesia.  At that point, the patient was  placed in the lateral decubitus position with the operative side up and  secured to the operating room table and all bony prominences padded.     The operative lower extremity was prepped from the iliac crest to the toes.  Sterile draping was performed.  Time out was performed prior to incision.      A routine posterolateral approach was utilized via sharp  dissection  carried down to the subcutaneous tissue.  Gross bleeders were Bovie  coagulated.  The iliotibial band was identified and incised  along the length of the skin incision.  Self-retaining retractors were  inserted. I examined the bursa there was significant hematoma and edema I performed a bursectomy here.  With the hip internally rotated, the short external rotators  were identified. The piriformis was tagged with FiberWire, and the hip capsule released in a T-type fashion.  The femoral neck was exposed, and I resected the femoral neck using the appropriate jig. This was performed at approximately a thumb's breadth above the lesser trochanter.    I then exposed the deep acetabulum, cleared out any tissue including the ligamentum teres, and included the hip capsule in the FiberWire used above and below the T.    I then prepared the proximal femur using the cookie-cutter, the lateralizing reamer, and then sequentially broached.  A trial utilized, and I reduced the hip and it was found to have excellent stability with functional range of motion. The trial components were then removed.   The canal and acetabulum were thoroughly irrigated  I inserted the pressfit stem and placed the head and neck collar. The hip was reduced with appropriate force and was stable through a range of motion.   I then used a 2 mm drill bits to pass the FiberWire suture from the capsule and puriform is through the greater trochanter, and secured this. Excellent posterior capsular repair was achieved. I also closed the T in the capsule.  I then irrigated the hip copiously again  with pulse lavage, and repaired the fascia with Vicryl, followed by Vicryl for the subcutaneous tissue, Monocryl for the skin, Steri-Strips and sterile gauze. The wounds were injected. The patient was then awakened and returned to PACU in stable and satisfactory condition. There were no complications.  POST-OP PLAN: Weight bearing as  tolerated. DVT px will consist of SCD's and chemical px  Edmonia Lynch, MD Orthopedic Surgeon [phone]   07/27/2021 10:38 AM

## 2021-07-27 NOTE — Plan of Care (Signed)
°  Problem: Clinical Measurements: Goal: Ability to maintain clinical measurements within normal limits will improve Outcome: Progressing   Problem: Pain Managment: Goal: General experience of comfort will improve Outcome: Progressing   Problem: Safety: Goal: Ability to remain free from injury will improve Outcome: Progressing   Problem: Education: Goal: Verbalization of understanding the information provided (i.e., activity precautions, restrictions, etc) will improve Outcome: Progressing

## 2021-07-27 NOTE — Transfer of Care (Signed)
Immediate Anesthesia Transfer of Care Note  Patient: Edwin [name]  Procedure(s) Performed: ARTHROPLASTY BIPOLAR HIP (HEMIARTHROPLASTY) (Right: Hip)  Patient Location: PACU  Anesthesia Type:Spinal  Level of Consciousness: sedated  Airway & Oxygen Therapy: Patient Spontanous Breathing and Patient connected to face mask oxygen  Post-op Assessment: Report given to RN and Post -op Vital signs reviewed and stable  Post vital signs: Reviewed and stable  Last Vitals:  Vitals Value Taken Time  BP 107/67 07/27/21 1222  Temp    Pulse    Resp 12 07/27/21 1223  SpO2    Vitals shown include unvalidated device data.  Last Pain:  Vitals:   07/27/21 0200  TempSrc:   PainSc: Asleep         Complications: No notable events documented.

## 2021-07-27 NOTE — Anesthesia Postprocedure Evaluation (Signed)
Anesthesia Post Note  Patient: Edwin [name]  Procedure(s) Performed: ARTHROPLASTY BIPOLAR HIP (HEMIARTHROPLASTY) (Right: Hip)     Patient location during evaluation: PACU Anesthesia Type: MAC and Spinal Level of consciousness: awake and alert and oriented Pain management: pain level controlled Vital Signs Assessment: post-procedure vital signs reviewed and stable Respiratory status: spontaneous breathing, nonlabored ventilation and respiratory function stable Cardiovascular status: blood pressure returned to baseline and stable Postop Assessment: no headache, no backache, spinal receding and no apparent nausea or vomiting Anesthetic complications: no   No notable events documented.  Last Vitals:  Vitals:   07/27/21 1230 07/27/21 1231  BP: 104/72 104/72  Pulse: 61 (!) 59  Resp: 16 16  Temp:    SpO2: 100% 100%    Last Pain:  Vitals:   07/27/21 1224  TempSrc:   PainSc: 0-No pain                 Pervis Hocking

## 2021-07-27 NOTE — Progress Notes (Signed)
Progress Note   Patient: Edwin [name] [id number] DOB: December 08, 1933 DOA: 07/26/2021     1 DOS: the patient was seen and examined on 07/27/2021   Brief hospital course: Mr. Polka is an 88 yo male with PMH dementia who presented to the ER after a mechanical fall.  History is provided by his wife in the ER as patient cannot provide collateral information due to his dementia.  They were in [place] in a hotel and upon getting out of bed in the middle of the night, he had a fall.  She immediately brought him back home to [facility] to the hospital for evaluation. X-rays were obtained which showed impacted fracture involving subcapital portion of the neck of the right femur. Orthopedic surgery was consulted and wife was amenable for surgical repair. Typically patient is ambulatory on a normal basis and can go up a flight of stairs without difficulty.  Other than his dementia he has no other major medical problems and takes no daily medications.  Assessment and Plan: * Closed right hip fracture ([place])- (present on admission) - Closed right neck of femur fracture due to mechanical fall - seen in room this morning prior to surgery; still doing well, pain seems minimal -Continue pain control - Orthopedic surgery following, tentative plan is for operative repair on 07/27/2021 - will need PT/OT and DVT ppx after surgery   Dementia without behavioral disturbance ([place]) - may still need tele sitter, especially post op -Continue delirium precautions as well     Subjective: No events overnight.  Resting in bed comfortably when seen this morning.  Pain was controlled and minimal.  Wife was not present this morning.  Plan is for surgery today.  Physical Exam: Vitals:   07/26/21 1845 07/26/21 2009 07/27/21 0110 07/27/21 0425  BP: (!) 141/89 (!) 146/116 137/85 133/87  Pulse: 89 89 84 83  Resp: 19 17 17 17   Temp:  99.4 F (37.4 C) 99.2 F (37.3 C) 98.1 F (36.7 C)  TempSrc:  Oral    SpO2: 97%  97% 99% 97%   Physical Exam Constitutional:      General: He is not in acute distress.    Appearance: Normal appearance.  HENT:     Head: Normocephalic and atraumatic.     Mouth/Throat:     Mouth: Mucous membranes are moist.  Eyes:     Extraocular Movements: Extraocular movements intact.  Cardiovascular:     Rate and Rhythm: Normal rate and regular rhythm.     Heart sounds: Normal heart sounds.  Pulmonary:     Effort: Pulmonary effort is normal. No respiratory distress.     Breath sounds: Normal breath sounds. No wheezing.  Abdominal:     General: Bowel sounds are normal. There is no distension.     Palpations: Abdomen is soft.     Tenderness: There is no abdominal tenderness.  Musculoskeletal:     Cervical back: Normal range of motion and neck supple.     Comments: RLE deferred due to fracture  Skin:    General: Skin is warm and dry.  Neurological:     Mental Status: He is alert.     Comments: Oriented to first name only. Dementia appreciated  Psychiatric:        Mood and Affect: Mood normal.        Behavior: Behavior normal.     Data Reviewed:  Results for orders placed or performed during the hospital encounter of 07/26/21 (from the past 24 hour(s))  Basic metabolic panel     Status: Abnormal   Collection Time: 07/26/21  3:44 PM  Result Value Ref Range   Sodium 142 135 - 145 mmol/L   Potassium 3.9 3.5 - 5.1 mmol/L   Chloride 109 98 - 111 mmol/L   CO2 29 22 - 32 mmol/L   Glucose, Bld 114 (H) 70 - 99 mg/dL   BUN 18 8 - 23 mg/dL   Creatinine, Ser 1.09 0.61 - 1.24 mg/dL   Calcium 9.4 8.9 - 10.3 mg/dL   GFR, Estimated >60 >60 mL/min   Anion gap 4 (L) 5 - 15  CBC with Differential     Status: Abnormal   Collection Time: 07/26/21  3:44 PM  Result Value Ref Range   WBC 9.3 4.0 - 10.5 K/uL   RBC 4.73 4.22 - 5.81 MIL/uL   Hemoglobin 13.7 13.0 - 17.0 g/dL   HCT 41.0 39.0 - 52.0 %   MCV 86.7 80.0 - 100.0 fL   MCH 29.0 26.0 - 34.0 pg   MCHC 33.4 30.0 - 36.0 g/dL    RDW 14.4 11.5 - 15.5 %   Platelets 128 (L) 150 - 400 K/uL   nRBC 0.0 0.0 - 0.2 %   Neutrophils Relative % 88 %   Neutro Abs 8.1 (H) 1.7 - 7.7 K/uL   Lymphocytes Relative 4 %   Lymphs Abs 0.4 (L) 0.7 - 4.0 K/uL   Monocytes Relative 7 %   Monocytes Absolute 0.7 0.1 - 1.0 K/uL   Eosinophils Relative 0 %   Eosinophils Absolute 0.0 0.0 - 0.5 K/uL   Basophils Relative 0 %   Basophils Absolute 0.0 0.0 - 0.1 K/uL   Immature Granulocytes 1 %   Abs Immature Granulocytes 0.07 0.00 - 0.07 K/uL  Protime-INR     Status: Abnormal   Collection Time: 07/26/21  3:44 PM  Result Value Ref Range   Prothrombin Time 15.7 (H) 11.4 - 15.2 seconds   INR 1.3 (H) 0.8 - 1.2  Type and screen [place]     Status: None   Collection Time: 07/26/21  3:44 PM  Result Value Ref Range   ABO/RH(D) O POS    Antibody Screen NEG    Sample Expiration      07/29/2021,2359 Performed at [facility], [place] [address], [place], [place] [address]   Resp Panel by RT-PCR (Flu A&B, Covid) Nasopharyngeal Swab     Status: None   Collection Time: 07/26/21  4:11 PM   Specimen: Nasopharyngeal Swab; Nasopharyngeal(NP) swabs in vial transport medium  Result Value Ref Range   SARS Coronavirus 2 by RT PCR NEGATIVE NEGATIVE   Influenza A by PCR NEGATIVE NEGATIVE   Influenza B by PCR NEGATIVE NEGATIVE  ABO/Rh     Status: None   Collection Time: 07/26/21  8:27 PM  Result Value Ref Range   ABO/RH(D)      O POS Performed at [facility], [place] [address], [place], [place] [address]   CBC with Differential/Platelet     Status: Abnormal   Collection Time: 07/27/21  3:11 AM  Result Value Ref Range   WBC 8.0 4.0 - 10.5 K/uL   RBC 5.02 4.22 - 5.81 MIL/uL   Hemoglobin 14.5 13.0 - 17.0 g/dL   HCT 45.9 39.0 - 52.0 %   MCV 91.4 80.0 - 100.0 fL   MCH 28.9 26.0 - 34.0 pg   MCHC 31.6 30.0 - 36.0 g/dL   RDW 15.2  11.5 - 15.5 %   Platelets 85 (L) 150 - 400 K/uL   nRBC 0.0 0.0 - 0.2 %    Neutrophils Relative % 83 %   Neutro Abs 6.6 1.7 - 7.7 K/uL   Lymphocytes Relative 9 %   Lymphs Abs 0.7 0.7 - 4.0 K/uL   Monocytes Relative 7 %   Monocytes Absolute 0.6 0.1 - 1.0 K/uL   Eosinophils Relative 1 %   Eosinophils Absolute 0.1 0.0 - 0.5 K/uL   Basophils Relative 0 %   Basophils Absolute 0.0 0.0 - 0.1 K/uL   Immature Granulocytes 0 %   Abs Immature Granulocytes 0.03 0.00 - 0.07 K/uL  Surgical pcr screen     Status: None   Collection Time: 07/27/21  5:00 AM   Specimen: Nasal Mucosa; Nasal Swab  Result Value Ref Range   MRSA, PCR NEGATIVE NEGATIVE   Staphylococcus aureus NEGATIVE NEGATIVE  Basic metabolic panel     Status: Abnormal   Collection Time: 07/27/21  6:44 AM  Result Value Ref Range   Sodium 143 135 - 145 mmol/L   Potassium 3.7 3.5 - 5.1 mmol/L   Chloride 110 98 - 111 mmol/L   CO2 29 22 - 32 mmol/L   Glucose, Bld 89 70 - 99 mg/dL   BUN 13 8 - 23 mg/dL   Creatinine, Ser 1.06 0.61 - 1.24 mg/dL   Calcium 9.6 8.9 - 10.3 mg/dL   GFR, Estimated >60 >60 mL/min   Anion gap 4 (L) 5 - 15  Magnesium     Status: None   Collection Time: 07/27/21  6:44 AM  Result Value Ref Range   Magnesium 2.2 1.7 - 2.4 mg/dL    I have Reviewed nursing notes, Vitals, and Lab results since pt's last encounter. Pertinent lab results : see above I have ordered test including BMP, CBC, Mg I have reviewed the last note from staff over past 24 hours I have discussed pt's care plan and test results with nursing staff, case manager  Antimicrobials:   Consultants: Orthopedic surgery  Procedures:    DVT ppx:  SCDs Start: 07/27/21 1058 Sequential compression device to OR Start: 07/27/21 0917 enoxaparin (LOVENOX) injection 40 mg Start: 07/26/21 2200     Code Status: Full Code   Family Communication: wife  Disposition: Status is: Inpatient Remains inpatient appropriate because: Treatment as outlined in A&P    Planned Discharge Destination: Skilled nursing  facility    Author: Dwyane Dee, MD 07/27/2021 12:16 PM  For on call review [email or web address].

## 2021-07-27 NOTE — Anesthesia Procedure Notes (Signed)
Spinal  Patient location during procedure: OR Start time: 07/27/2021 10:29 AM End time: 07/27/2021 10:31 AM Reason for block: surgical anesthesia Staffing Performed: anesthesiologist  Anesthesiologist: Lannie Fields, DO Preanesthetic Checklist Completed: patient identified, IV checked, risks and benefits discussed, surgical consent, monitors and equipment checked, pre-op evaluation and timeout performed Spinal Block Patient position: sitting Prep: DuraPrep and site prepped and draped Patient monitoring: cardiac monitor, continuous pulse ox and blood pressure Approach: midline Location: L3-4 Injection technique: single-shot Needle Needle type: Pencan  Needle gauge: 24 G Needle length: 9 cm Assessment Sensory level: T6 Events: CSF return Additional Notes Functioning IV was confirmed and monitors were applied. Sterile prep and drape, including hand hygiene and sterile gloves were used. The patient was positioned and the spine was prepped. The skin was anesthetized with lidocaine.  Free flow of clear CSF was obtained prior to injecting local anesthetic into the CSF.  The spinal needle aspirated freely following injection.  The needle was carefully withdrawn.  The patient tolerated the procedure well.

## 2021-07-27 NOTE — TOC Initial Note (Signed)
Transition of Care [facility]) - Initial/Assessment Note    Patient Details  Name: Edwin [name] MRN: [id number] Date of Birth: 1933-08-07  [facility] CM/SW Contact:    Ross Ludwig, LCSW Phone Number: 07/27/2021, 7:24 PM  Clinical Narrative:                  CSW spoke to patient's wife to discuss SNF placement.  Originally patient's wife wanted him to go to a SNF near [place] because they have family there.  CSW explained the barriers that would prevent patient from being placed there.  CSW informed her that there are several facilities in the area that do short term rehab.  CSW provided her a list of the different facilities.  Patient's wife would like CSW to find a facility in [place], [place], or [place].  CSW explained the network for [place] is small, and it would take a few days for auth, she expressed understanding.  CSW also informed her of what happens if patient is denied.  CSW explained the appeal process for SNF placement and if patient is denied once he is at a SNF.  Patient's wife was very appreciative of information given.  CSW to begin bed search after PT and OT see him.  CSW to continue to follow patient's progress throughout discharge planning.  Expected Discharge Plan: Skilled Nursing Facility Barriers to Discharge: Continued Medical Work up   Patient Goals and CMS Choice Patient states their goals for this hospitalization and ongoing recovery are:: To go to SNF for short term rehab, then return back home. CMS Medicare.gov Compare Post Acute Care list provided to:: Patient Represenative (must comment) Choice offered to / list presented to : Spouse  Expected Discharge Plan and Services Expected Discharge Plan: [place] arrangements for the past 2 months: Single Family Home                                      Prior Living Arrangements/Services Living arrangements for the past 2 months: Single Family  Home Lives with:: Spouse Patient language and need for interpreter reviewed:: Yes Do you feel safe going back to the place where you live?: No   Wife wants patient to go to SNF first before returning back home.  Need for Family Participation in Patient Care: Yes (Comment) Care giver support system in place?: No (comment)   Criminal Activity/Legal Involvement Pertinent to Current Situation/Hospitalization: No - Comment as needed  Activities of Daily Living Home Assistive Devices/Equipment: None ADL Screening (condition at time of admission) Patient's cognitive ability adequate to safely complete daily activities?: No Is the patient deaf or have difficulty hearing?: No Does the patient have difficulty seeing, even when wearing glasses/contacts?: No Does the patient have difficulty concentrating, remembering, or making decisions?: Yes Patient able to express need for assistance with ADLs?: No Does the patient have difficulty dressing or bathing?: Yes Independently performs ADLs?: No Communication: Needs assistance Is this a change from baseline?: Pre-admission baseline Dressing (OT): Needs assistance Is this a change from baseline?: Pre-admission baseline Grooming: Needs assistance Is this a change from baseline?: Pre-admission baseline Feeding: Needs assistance Is this a change from baseline?: Pre-admission baseline Bathing: Needs assistance Is this a change from baseline?: Pre-admission baseline Toileting: Needs assistance Is this a change from baseline?: Pre-admission baseline In/Out Bed: Needs assistance Is this a change from  baseline?: Pre-admission baseline Walks in Home: Needs assistance Is this a change from baseline?: Pre-admission baseline Does the patient have difficulty walking or climbing stairs?: Yes Weakness of Legs: Both Weakness of Arms/Hands: None  Permission Sought/Granted Permission sought to share information with : Case Manager, Forensic psychologist, Family Supports Permission granted to share information with : Yes, Release of Information Signed  Share Information with NAME: Alcario, Andress [phone]  [phone]  Permission granted to share info w AGENCY: SNF admissions        Emotional Assessment Appearance:: Appears stated age   Affect (typically observed): Accepting, Appropriate, Calm Orientation: : Oriented to Self, Oriented to Place, Oriented to  Time, Oriented to Situation Alcohol / Substance Use: Not Applicable Psych Involvement: No (comment)  Admission diagnosis:  Closed right hip fracture (HCC) [S72.001A] Closed fracture of hip, unspecified laterality, initial encounter [facility]) [S72.009A] Patient Active Problem List   Diagnosis Date Noted   Closed right hip fracture (Home) 07/26/2021   Dementia without behavioral disturbance ([place]) 07/26/2021   PCP:  Pcp, No Pharmacy:  No Pharmacies Listed    Social Determinants of Health (SDOH) Interventions    Readmission Risk Interventions No flowsheet data found.

## 2021-07-27 NOTE — Progress Notes (Signed)
Second CHG bath done. Patient transported to the O.R.

## 2021-07-27 NOTE — Anesthesia Procedure Notes (Signed)
Procedure Name: MAC Date/Time: 07/27/2021 10:26 AM Performed by: Niel Hummer, CRNA Pre-anesthesia Checklist: Patient identified, Emergency Drugs available, Suction available and Patient being monitored Oxygen Delivery Method: Simple face mask

## 2021-07-27 NOTE — Consult Note (Signed)
ORTHOPAEDIC CONSULTATION  REQUESTING PHYSICIAN: Dwyane Dee, MD  Chief Complaint: right femur fracture  HPI: Edwin [name] is a 88 y.o. male with past medical history significant for dementia who presented to the ER after a fall. No family at the bedside this AM. Unable to obtain much history from patient due to dementia.   Per previous note, ambulates well at baseline and is able to go up a flight of stairs without difficulty.   History reviewed. No pertinent past medical history. History reviewed. No pertinent surgical history. Social History   Socioeconomic History   Marital status: Married    Spouse name: Not on file   Number of children: Not on file   Years of education: Not on file   Highest education level: Not on file  Occupational History   Not on file  Tobacco Use   Smoking status: Never   Smokeless tobacco: Never  Substance and Sexual Activity   Alcohol use: Not Currently   Drug use: Not Currently   Sexual activity: Not on file  Other Topics Concern   Not on file  Social History Narrative   Not on file   Social Determinants of Health   Financial Resource Strain: Not on file  Food Insecurity: Not on file  Transportation Needs: Not on file  Physical Activity: Not on file  Stress: Not on file  Social Connections: Not on file   Family History  Problem Relation Age of Onset   Dementia Sister    Dementia Brother    No Known Allergies Prior to Admission medications   Not on File   DG Chest Port 1 View  Result Date: 07/26/2021 CLINICAL DATA:  Preoperative chest radiograph prior to RIGHT hip surgery. EXAM: PORTABLE CHEST 1 VIEW COMPARISON:  None FINDINGS: Cardiomediastinal silhouette is within normal limits. There is no evidence of focal airspace disease, pulmonary edema, suspicious pulmonary nodule/mass, pleural effusion, or pneumothorax. No acute bony abnormalities are identified. Degenerative changes in the shoulders are noted. IMPRESSION: No  active disease. Electronically Signed   By: Margarette Canada M.D.   On: 07/26/2021 15:51   DG Hip Unilat W or Wo Pelvis 2-3 Views Right  Result Date: 07/26/2021 CLINICAL DATA:  Trauma, fall EXAM: DG HIP (WITH OR WITHOUT PELVIS) 2-3V RIGHT COMPARISON:  None. FINDINGS: There is impacted fracture in the subcapital portion of neck of right femur. There is no dislocation. Degenerative changes with small bony spurs are noted in right hip. IMPRESSION: Recent impacted fracture is seen in the subcapital portion of neck of right femur. There is no dislocation. Degenerative changes with small bony spurs seen in the right hip. Electronically Signed   By: Elmer Picker M.D.   On: 07/26/2021 15:19   Family History Reviewed and non-contributory, no pertinent history of problems with bleeding or anesthesia      Review of Systems Unable to obtain due to patient's dementia   OBJECTIVE  Vitals:Patient Vitals for the past 8 hrs:  BP Temp Pulse Resp SpO2  07/27/21 0425 133/87 98.1 F (36.7 C) 83 17 97 %   General: Alert, no acute distress Cardiovascular: Warm extremities noted Respiratory: No cyanosis, no use of accessory musculature GI: No organomegaly, abdomen is soft and non-tender Skin: No lesions in the area of chief complaint other than those listed below in MSK exam.  Neurologic: Sensation intact distally save for the below mentioned MSK exam Psychiatric: Patient is competent for consent with normal mood and affect. Dementia Lymphatic: No swelling  obvious and reported other than the area involved in the exam below  Extremities  Bilateral upper extremities: actively moves upper extremity without pain. Nontender to palpation. NVI YOM:[id number] and externally rotated.  ROM deferred. + GS/TA/EHL. Sensation intact in DP/SP/S/S/P distributions. 2+ DP pulse with warm and well perfused digits. Compartments soft and compressible, with no pain on passive stretch. LLE: nontender to palpation hip, knee,  ankle, foot. + GS/TA/EHL. Sensation intact in DP/SP/S/S/P distributions. 2+ DP pulse with warm and well perfused digits. Compartments soft and compressible, with no pain on passive stretch.    Test Results Imaging Xrays of right hip demonstrate impacted subcapital femur fracture   Labs cbc Recent Labs    07/26/21 1544 07/27/21 0311  WBC 9.3 8.0  HGB 13.7 14.5  HCT 41.0 45.9  PLT 128* 85*    Labs inflam No results for input(s): CRP in the last 72 hours.  Invalid input(s): ESR  Labs coag Recent Labs    07/26/21 1544  INR 1.3*    Recent Labs    07/26/21 1544 07/27/21 0644  NA 142 143  K 3.9 3.7  CL 109 110  CO2 29 29  GLUCOSE 114* 89  BUN 18 13  CREATININE 1.09 1.06  CALCIUM 9.4 9.6     ASSESSMENT AND PLAN: 88 y.o. male with the following: right femur fracture  Orthopedics recommends admission to a medical service and we will provide consultation and follow along.  Discussed the nature of the injury as well as the care with the patient again this morning. Discussed options and non-operative versus operative measures. Nonoperative measures are not well tolerated as patient's on bedrest for extended periods of time tend to develop secondary issues such as pneumonia, urinary tract infections, bedsores and delirium.  Based on this our recommendation is for operative measures.  Understanding this the patient/family elected to proceed with operative measures.  The risks and benefits of  surgical intervention including infection, bleeding, nerve injury, periprosthetic fracture, the need for revision surgery, leg length discrepancy, gait change, blood clots, cardiopulmonary complications, morbidity, mortality, among others, and they were willing to proceed.    Dr. Mardelle Matte has spoken to the patient's family who have elected to proceed with surgery.   - Plan: Operative fixation today with Dr. Percell Miller - NPO  -Medicine team to admit and perform pre-op clearance - Weight  Bearing Status/Activity: will ammend WB status postop, bedrest for now - PT/OT post op - VTE Prophylaxis: SCDs for now - Pain control: PRN pain medications - Dispo: Likely to require Rehab or SNF placement upon discharge.  - Contact information: Dr. Levin Erp, PA-C  [place], [place] 07/27/2021 9:19 AM

## 2021-07-27 NOTE — Anesthesia Preprocedure Evaluation (Addendum)
Anesthesia Evaluation  Patient identified by MRN, date of birth, ID band Patient awake    Reviewed: Allergy & Precautions, NPO status , Patient's Chart, lab work & pertinent test results  Airway Mallampati: II  TM Distance: >3 FB Neck ROM: Full    Dental no notable dental hx. (+) Dental Advisory Given, Teeth Intact   Pulmonary neg pulmonary ROS,    Pulmonary exam normal breath sounds clear to auscultation       Cardiovascular Exercise Tolerance: Good METS: 5 - 7 Mets Normal cardiovascular exam Rhythm:Regular Rate:Normal  Typically patient is ambulatory on a normal basis and can go up a flight of stairs without difficulty     Neuro/Psych PSYCHIATRIC DISORDERS Dementia negative neurological ROS     GI/Hepatic negative GI ROS, Neg liver ROS,   Endo/Other  negative endocrine ROS  Renal/GU negative Renal ROS  negative genitourinary   Musculoskeletal negative musculoskeletal ROS (+)   Abdominal   Peds  Hematology hct 45.9, plt 85   Anesthesia Other Findings   Reproductive/Obstetrics negative OB ROS                          Anesthesia Physical Anesthesia Plan  ASA: 3  Anesthesia Plan: Spinal and MAC   Post-op Pain Management: Ofirmev IV (intra-op)*   Induction:   PONV Risk Score and Plan: 2 and Propofol infusion and TIVA  Airway Management Planned: Natural Airway and Nasal Cannula  Additional Equipment: None  Intra-op Plan:   Post-operative Plan:   Informed Consent: I have reviewed the patients History and Physical, chart, labs and discussed the procedure including the risks, benefits and alternatives for the proposed anesthesia with the patient or authorized representative who has indicated his/her understanding and acceptance.     Dental advisory given and Consent reviewed with POA  Plan Discussed with: CRNA  Anesthesia Plan Comments: (Spoke with wife regarding anesthesia  plan, will attempt spinal d/t overall frailty and pre-existing cognitive deficits. plt 85 so will only attempt once, GA as backup. Access is one 24G PIV- will need to place additional PIVs once sedated Type and screen)      Anesthesia Quick Evaluation

## 2021-07-28 ENCOUNTER — Other Ambulatory Visit: Payer: Self-pay

## 2021-07-28 DIAGNOSIS — S72001A Fracture of unspecified part of neck of right femur, initial encounter for closed fracture: Secondary | ICD-10-CM | POA: Diagnosis not present

## 2021-07-28 DIAGNOSIS — F0390 Unspecified dementia without behavioral disturbance: Secondary | ICD-10-CM | POA: Diagnosis not present

## 2021-07-28 LAB — CBC WITH DIFFERENTIAL/PLATELET
Abs Immature Granulocytes: 0.03 10*3/uL (ref 0.00–0.07)
Basophils Absolute: 0 10*3/uL (ref 0.0–0.1)
Basophils Relative: 0 %
Eosinophils Absolute: 0 10*3/uL (ref 0.0–0.5)
Eosinophils Relative: 0 %
HCT: 38.2 % — ABNORMAL LOW (ref 39.0–52.0)
Hemoglobin: 12.8 g/dL — ABNORMAL LOW (ref 13.0–17.0)
Immature Granulocytes: 0 %
Lymphocytes Relative: 4 %
Lymphs Abs: 0.5 10*3/uL — ABNORMAL LOW (ref 0.7–4.0)
MCH: 29.1 pg (ref 26.0–34.0)
MCHC: 33.5 g/dL (ref 30.0–36.0)
MCV: 86.8 fL (ref 80.0–100.0)
Monocytes Absolute: 0.8 10*3/uL (ref 0.1–1.0)
Monocytes Relative: 7 %
Neutro Abs: 9.5 10*3/uL — ABNORMAL HIGH (ref 1.7–7.7)
Neutrophils Relative %: 89 %
Platelets: 114 10*3/uL — ABNORMAL LOW (ref 150–400)
RBC: 4.4 MIL/uL (ref 4.22–5.81)
RDW: 14.3 % (ref 11.5–15.5)
WBC: 10.8 10*3/uL — ABNORMAL HIGH (ref 4.0–10.5)
nRBC: 0 % (ref 0.0–0.2)

## 2021-07-28 LAB — BASIC METABOLIC PANEL
Anion gap: 6 (ref 5–15)
BUN: 16 mg/dL (ref 8–23)
CO2: 26 mmol/L (ref 22–32)
Calcium: 9.2 mg/dL (ref 8.9–10.3)
Chloride: 109 mmol/L (ref 98–111)
Creatinine, Ser: 1.03 mg/dL (ref 0.61–1.24)
GFR, Estimated: >60 mL/min (ref >60)
Glucose, Bld: 129 mg/dL — ABNORMAL HIGH (ref 70–99)
Potassium: 4.2 mmol/L (ref 3.5–5.1)
Sodium: 141 mmol/L (ref 135–145)

## 2021-07-28 LAB — MAGNESIUM: Magnesium: 2.1 mg/dL (ref 1.7–2.4)

## 2021-07-28 LAB — HEMOGLOBIN AND HEMATOCRIT, BLOOD
HCT: 39.1 % (ref 39.0–52.0)
Hemoglobin: 13 g/dL (ref 13.0–17.0)

## 2021-07-28 MED ORDER — SODIUM CHLORIDE 0.9 % IV BOLUS
1000.0000 mL | Freq: Once | INTRAVENOUS | Status: AC
  Administered 2021-07-28: 1000 mL via INTRAVENOUS

## 2021-07-28 NOTE — Progress Notes (Signed)
° °  ORTHOPAEDIC PROGRESS NOTE  s/p Procedure(s): ARTHROPLASTY BIPOLAR HIP (HEMIARTHROPLASTY) on 07/27/2021 with Dr. Eulah Pont  SUBJECTIVE: Reports mild pain about operative site. No chest pain. No SOB. No nausea/vomiting. No other complaints.  OBJECTIVE: PE: General: sitting up in hospital bed, NAD RLE: incision CDI, tender to palpation right hip,  intact EHL/TA/GSC, leg lengths equal, warm well perfused foot   Vitals:   07/28/21 0006 07/28/21 0603  BP: (!) 111/98 97/66  Pulse: 84 80  Resp: 17 14  Temp: 98 F (36.7 C) 98.6 F (37 C)  SpO2: 100% 99%    ASSESSMENT: Edwin [name] is a 88 y.o. male doing well postoperatively. POD#1  PLAN: Weightbearing: WBAT RLE Insicional and dressing care: Reinforce dressings as needed Orthopedic device(s): None Showering: Post-op day #2 with assistance VTE prophylaxis: Lovenox 40mg  qd  Pain control: PRN pain medications, minimize narcotics as able Follow - up plan: 2 weeks in office with Dr. information:  Adora Fridge 8-5 [id number] PA-C, After hours and holidays please check [email or web address] for group call information for [facility]  Alfonse Alpers, PA-C 07/28/2021

## 2021-07-28 NOTE — Progress Notes (Signed)
Patient's wife called the office today stating her concerns about her husband eating and his dementia. He needs to have assistance and monitoring when eating meals because he will not be able to recall what he has eaten.   Discussed this with patient's nurse who will make sure this is reiterated during shift change.   Needs assistance with meals. Patient is a vegan per wife.   Edwin Alpers, PA-C 07/28/2021

## 2021-07-28 NOTE — Progress Notes (Signed)
Progress Note   Patient: Edwin [name] [id number] DOB: 1933/11/26 DOA: 07/26/2021     2 DOS: the patient was seen and examined on 07/28/2021   Brief hospital course: Edwin [name] is an 88 yo male with PMH dementia who presented to the ER after a mechanical fall.  History is provided by his wife in the ER as patient cannot provide collateral information due to his dementia.  They were in [place] in a hotel and upon getting out of bed in the middle of the night, he had a fall.  She immediately brought him back home to [facility] to the hospital for evaluation. X-rays were obtained which showed impacted fracture involving subcapital portion of the neck of the right femur. Orthopedic surgery was consulted and wife was amenable for surgical repair. Typically patient is ambulatory on a normal basis and can go up a flight of stairs without difficulty.  Other than his dementia he has no other major medical problems and takes no daily medications.  Assessment and Plan: * Closed right hip fracture ([place])- (present on admission) - Closed right neck of femur fracture due to mechanical fall - seen in room this morning prior to surgery; still doing well, pain seems minimal -Continue pain control - s/p right hemiarthroplasty on 07/27/2021; weightbearing as tolerated to right lower extremity per orthopedic surgery - Continue PT/OT - Continue Lovenox for DVT prophylaxis -Wife is against patient being on medications including aspirin, Tylenol; she is apparently okay with stool softener and Lovenox  Dementia without behavioral disturbance ([place]) -Continue delirium precautions as well    Subjective: No events overnight.  Tolerated surgery well yesterday.  Pain minimal when seen this morning.  Physical Exam: Vitals:   07/28/21 0006 07/28/21 0603 07/28/21 0932 07/28/21 0941  BP: (!) 111/98 97/66 (!) 89/51 (!) 93/54  Pulse: 84 80 90 87  Resp: 17 14 15    Temp: 98 F (36.7 C) 98.6 F (37 C) 97.9 F  (36.6 C)   TempSrc: Axillary Oral Oral   SpO2: 100% 99% 100%   Weight:      Height:       Physical Exam Constitutional:      General: He is not in acute distress.    Appearance: Normal appearance.  HENT:     Head: Normocephalic and atraumatic.     Mouth/Throat:     Mouth: Mucous membranes are moist.  Eyes:     Extraocular Movements: Extraocular movements intact.  Cardiovascular:     Rate and Rhythm: Normal rate and regular rhythm.     Heart sounds: Normal heart sounds.  Pulmonary:     Effort: Pulmonary effort is normal. No respiratory distress.     Breath sounds: Normal breath sounds. No wheezing.  Abdominal:     General: Bowel sounds are normal. There is no distension.     Palpations: Abdomen is soft.     Tenderness: There is no abdominal tenderness.  Musculoskeletal:     Cervical back: Normal range of motion and neck supple.     Comments: Surgical dressing in place on right hip.  Compartments soft  Skin:    General: Skin is warm and dry.  Neurological:     Mental Status: He is alert.     Comments: Oriented to first name only. Dementia appreciated  Psychiatric:        Mood and Affect: Mood normal.        Behavior: Behavior normal.     Data Reviewed:  Results for orders placed  or performed during the hospital encounter of 07/26/21 (from the past 24 hour(s))  Basic metabolic panel     Status: Abnormal   Collection Time: 07/28/21  3:23 AM  Result Value Ref Range   Sodium 141 135 - 145 mmol/L   Potassium 4.2 3.5 - 5.1 mmol/L   Chloride 109 98 - 111 mmol/L   CO2 26 22 - 32 mmol/L   Glucose, Bld 129 (H) 70 - 99 mg/dL   BUN 16 8 - 23 mg/dL   Creatinine, Ser 1.03 0.61 - 1.24 mg/dL   Calcium 9.2 8.9 - 10.3 mg/dL   GFR, Estimated >60 >60 mL/min   Anion gap 6 5 - 15  CBC with Differential/Platelet     Status: Abnormal   Collection Time: 07/28/21  3:23 AM  Result Value Ref Range   WBC 10.8 (H) 4.0 - 10.5 K/uL   RBC 4.40 4.22 - 5.81 MIL/uL   Hemoglobin 12.8 (L) 13.0  - 17.0 g/dL   HCT 38.2 (L) 39.0 - 52.0 %   MCV 86.8 80.0 - 100.0 fL   MCH 29.1 26.0 - 34.0 pg   MCHC 33.5 30.0 - 36.0 g/dL   RDW 14.3 11.5 - 15.5 %   Platelets 114 (L) 150 - 400 K/uL   nRBC 0.0 0.0 - 0.2 %   Neutrophils Relative % 89 %   Neutro Abs 9.5 (H) 1.7 - 7.7 K/uL   Lymphocytes Relative 4 %   Lymphs Abs 0.5 (L) 0.7 - 4.0 K/uL   Monocytes Relative 7 %   Monocytes Absolute 0.8 0.1 - 1.0 K/uL   Eosinophils Relative 0 %   Eosinophils Absolute 0.0 0.0 - 0.5 K/uL   Basophils Relative 0 %   Basophils Absolute 0.0 0.0 - 0.1 K/uL   Immature Granulocytes 0 %   Abs Immature Granulocytes 0.03 0.00 - 0.07 K/uL  Magnesium     Status: None   Collection Time: 07/28/21  3:23 AM  Result Value Ref Range   Magnesium 2.1 1.7 - 2.4 mg/dL    I have Reviewed nursing notes, Vitals, and Lab results since pt's last encounter. Pertinent lab results : see above I have ordered test including BMP, CBC, Mg I have reviewed the last note from staff over past 24 hours I have discussed pt's care plan and test results with nursing staff, case manager  Antimicrobials:   Consultants: Orthopedic surgery  Procedures:    DVT ppx:  enoxaparin (LOVENOX) injection 40 mg Start: 07/28/21 0800 SCDs Start: 07/27/21 1058     Code Status: Full Code   Family Communication: wife  Disposition: Status is: Inpatient Remains inpatient appropriate because: Treatment as outlined in A&P    Planned Discharge Destination: Skilled nursing facility    Author: Dwyane Dee, MD 07/28/2021 1:11 PM  For on call review [email or web address].

## 2021-07-28 NOTE — NC FL2 (Addendum)
[place] MEDICAID FL2 LEVEL OF CARE SCREENING TOOL     IDENTIFICATION  Patient Name: Edwin [name] Birthdate: Aug 10, 1933 Sex: male Admission Date (Current Location): 07/26/2021  [facility] and [place] Number:  Producer, television/film/video and Address:  [facility],  [address] [place] [address]      Provider Number: [id number]  Attending Physician Name and Address:  Lewie Chamber, MD  Relative Name and Phone Number:       Current Level of Care: Hospital Recommended Level of Care: Skilled Nursing Facility Prior Approval Number:    Date Approved/Denied:   PASRR Number: [id number]  Discharge Plan: SNF    Current Diagnoses: Patient Active Problem List   Diagnosis Date Noted   Closed right hip fracture (HCC) 07/26/2021   Dementia without behavioral disturbance (HCC) 07/26/2021    Orientation RESPIRATION BLADDER Height & Weight     Self  Normal Continent, External catheter Weight: 140 lb 8 oz (63.7 kg) Height:  5\' 11"  (180.3 cm)  BEHAVIORAL SYMPTOMS/MOOD NEUROLOGICAL BOWEL NUTRITION STATUS      Continent Diet  AMBULATORY STATUS COMMUNICATION OF NEEDS Skin   Extensive Assist Verbally Other (Comment) (surgical incision only)                       Personal Care Assistance Level of Assistance  Bathing, Feeding, Dressing Bathing Assistance: Limited assistance Feeding assistance: Independent Dressing Assistance: Limited assistance     Functional Limitations Info             SPECIAL CARE FACTORS FREQUENCY  PT (By licensed PT), OT (By licensed OT)     PT Frequency: 5x/wk OT Frequency: 5x/wk            Contractures Contractures Info: Not present    Additional Factors Info  Code Status, Allergies Code Status Info: Full Allergies Info: NKDA           Current Medications (07/28/2021):  This is the current hospital active medication list Current Facility-Administered Medications  Medication Dose Route Frequency Provider Last Rate Last  Admin   0.9 %  sodium chloride infusion   Intravenous PRN 07/30/2021, MD 10 mL/hr at 07/27/21 1703 New Bag at 07/27/21 1703   acetaminophen (TYLENOL) tablet 325-650 mg  325-650 mg Oral Q6H PRN McBane, 07/29/21, PA-C       acetaminophen (TYLENOL) tablet 500 mg  500 mg Oral Q6H McBane, Jerald Kief, PA-C   500 mg at 07/28/21 07/30/21   bisacodyl (DULCOLAX) EC tablet 5 mg  5 mg Oral Daily PRN 4332, PA-C       docusate sodium (COLACE) capsule 100 mg  100 mg Oral BID Vernetta Honey, PA-C   100 mg at 07/28/21 0935   enoxaparin (LOVENOX) injection 40 mg  40 mg Subcutaneous Q24H 07/30/21, PA-C   40 mg at 07/28/21 07/30/21   HYDROcodone-acetaminophen (NORCO) 7.5-325 MG per tablet 1-2 tablet  1-2 tablet Oral Q4H PRN McBane, 10-06-1974, PA-C       HYDROcodone-acetaminophen (NORCO/VICODIN) 5-325 MG per tablet 1-2 tablet  1-2 tablet Oral Q4H PRN McBane, Jerald Kief, PA-C       menthol-cetylpyridinium (CEPACOL) lozenge 3 mg  1 lozenge Oral PRN McBane, Jerald Kief, PA-C       Or   phenol (CHLORASEPTIC) mouth spray 1 spray  1 spray Mouth/Throat PRN McBane, Jerald Kief, PA-C       metoCLOPramide (REGLAN) tablet 5-10 mg  5-10 mg Oral Q8H PRN  Vernetta Honey, PA-C       Or   metoCLOPramide (REGLAN) injection 5-10 mg  5-10 mg Intravenous Q8H PRN McBane, Jerald Kief, PA-C       morphine (PF) 2 MG/ML injection 0.5-1 mg  0.5-1 mg Intravenous Q2H PRN Vernetta Honey, PA-C   1 mg at 07/27/21 2259   ondansetron (ZOFRAN) tablet 4 mg  4 mg Oral Q6H PRN McBane, Jerald Kief, PA-C       Or   ondansetron (ZOFRAN) injection 4 mg  4 mg Intravenous Q6H PRN McBane, Jerald Kief, PA-C       polyethylene glycol (MIRALAX / GLYCOLAX) packet 17 g  17 g Oral Daily PRN McBane, Caroline N, PA-C       sodium chloride flush (NS) 0.9 % injection 3 mL  3 mL Intravenous Q12H McBane, Jerald Kief, PA-C   3 mL at 07/28/21 7939     Discharge Medications: Please see discharge summary for a list of discharge  medications.  Relevant Imaging Results:  Relevant Lab Results:   Additional Information SS# [id number]  Amada Jupiter, LCSW

## 2021-07-28 NOTE — Plan of Care (Signed)

## 2021-07-28 NOTE — Plan of Care (Signed)
  Problem: Pain Managment: Goal: General experience of comfort will improve Outcome: Progressing   Problem: Safety: Goal: Ability to remain free from injury will improve Outcome: Progressing   Problem: Skin Integrity: Goal: Risk for impaired skin integrity will decrease Outcome: Progressing   

## 2021-07-28 NOTE — Evaluation (Signed)
Physical Therapy Evaluation Patient Details Name: Edwin [name] MRN: [id number] DOB: 11/29/1933 Today's Date: 07/28/2021  History of Present Illness  88 yo male with who presented to the ER after a mechanical fall.   X-rays were obtained which showed impacted fracture involving subcapital portion of the neck of the right femur.  Orthopedic surgery was consulted and wife was amenable for surgical repair. pt is s/p R posterior hemiarthroplasty on 07/27/21 per Dr Percell Miller.  PMH dementia  Clinical Impression  Pt admitted with above diagnosis.  Per wife pt is independent at his baseline for ambulation. He has never used a walker or other device. Requiring 2 assist today for bed mobility and transfers. Difficulty following commands and maintaining precautions d/t baseline dementia.  Recommend SNF post acute to maximize pt independence and allow safe transition home.  Pt currently with functional limitations due to the deficits listed below (see PT Problem List). Pt will benefit from skilled PT to increase their independence and safety with mobility to allow discharge to the venue listed below.          Recommendations for follow up therapy are one component of a multi-disciplinary discharge planning process, led by the attending physician.  Recommendations may be updated based on patient status, additional functional criteria and insurance authorization.  Follow Up Recommendations Skilled nursing-short term rehab (<3 hours/day)    Assistance Recommended at Discharge Frequent or constant Supervision/Assistance  Patient can return home with the following  Assistance with cooking/housework;Direct supervision/assist for financial management;Assist for transportation;Help with stairs or ramp for entrance;Direct supervision/assist for medications management;A lot of help with bathing/dressing/bathroom;Two people to help with walking and/or transfers    Equipment Recommendations Rolling walker (2 wheels)  (or defer to SNF)  Recommendations for Other Services       Functional Status Assessment Patient has had a recent decline in their functional status and demonstrates the ability to make significant improvements in function in a reasonable and predictable amount of time.     Precautions / Restrictions Precautions Precautions: Fall;Posterior Hip Precaution Comments: left precaution handout and listed on whiteboard, reviewed with wife Restrictions Weight Bearing Restrictions: No Other Position/Activity Restrictions: WBAT      Mobility  Bed Mobility Overal bed mobility: Needs Assistance Bed Mobility: Supine to Sit     Supine to sit: Max assist, +2 for physical assistance, +2 for safety/equipment     General bed mobility comments: pt able to assist with lateral scooting, bed pad used to pivot to EOB, assist with RLE    Transfers Overall transfer level: Needs assistance Equipment used: Rolling walker (2 wheels) Transfers: Sit to/from Stand, Bed to chair/wheelchair/BSC Sit to Stand: Mod assist, +2 physical assistance, +2 safety/equipment, Min assist   Step pivot transfers: Max assist, Mod assist, +2 physical assistance, +2 safety/equipment       General transfer comment: requires physical assist to complete anterior-superior wt transition, assist to pivot and physical assist to maintain and follow posterior hip precautions. able to take a few very small steps bed to chair, requires assist to balance, pivot, maneuver RW    Ambulation/Gait               General Gait Details: unable d/t R hip pain  Stairs            Wheelchair Mobility    Modified Rankin (Stroke Patients Only)       Balance Overall balance assessment: Needs assistance Sitting-balance support: Feet supported Sitting balance-Leahy Scale: Fair  Standing balance-Leahy Scale: Poor                               Pertinent Vitals/Pain Pain Assessment Pain Assessment:  Faces Faces Pain Scale: Hurts even more Pain Location: right hip Pain Descriptors / Indicators: Grimacing, Guarding Pain Intervention(s): Limited activity within patient's tolerance, Monitored during session, Repositioned (wife declined pain meds per RN)    Home Living Family/patient expects to be discharged to:: Skilled nursing facility                   Additional Comments: pt and wife walk at least a mile per day    Prior Function                       Hand Dominance        Extremity/Trunk Assessment   Upper Extremity Assessment Upper Extremity Assessment: Overall WFL for tasks assessed    Lower Extremity Assessment Lower Extremity Assessment: LLE deficits/detail RLE: Unable to fully assess due to pain LLE Deficits / Details: grossly WFL       Communication   Communication: No difficulties  Cognition Arousal/Alertness: Awake/alert Behavior During Therapy: Agitated Overall Cognitive Status: History of cognitive impairments - at baseline Area of Impairment: Following commands                       Following Commands: Follows one step commands inconsistently       General Comments: pt is oriented to self only, requires hand over hand assist to follow one step commands. wife attempts to instruct however this does not improve his abnilityto follow directions        General Comments      Exercises     Assessment/Plan    PT Assessment Patient needs continued PT services  PT Problem List Decreased strength;Decreased mobility;Decreased safety awareness;Decreased knowledge of precautions;Decreased range of motion;Decreased activity tolerance;Decreased balance;Decreased knowledge of use of DME;Pain;Decreased cognition       PT Treatment Interventions DME instruction;Therapeutic exercise;Gait training;Functional mobility training;Therapeutic activities;Patient/family education    PT Goals (Current goals can be found in the Care Plan  section)  Acute Rehab PT Goals Patient Stated Goal: per wife--pt to go to rehab PT Goal Formulation: With family Time For Goal Achievement: 08/11/21 Potential to Achieve Goals: Good    Frequency Min 3X/week     Co-evaluation               AM-PAC PT "6 Clicks" Mobility  Outcome Measure Help needed turning from your back to your side while in a flat bed without using bedrails?: Total Help needed moving from lying on your back to sitting on the side of a flat bed without using bedrails?: Total Help needed moving to and from a bed to a chair (including a wheelchair)?: Total Help needed standing up from a chair using your arms (e.g., wheelchair or bedside chair)?: Total Help needed to walk in hospital room?: Total Help needed climbing 3-5 steps with a railing? : Total 6 Click Score: 6    End of Session Equipment Utilized During Treatment: Gait belt Activity Tolerance: Patient limited by pain   Nurse Communication: Mobility status PT Visit Diagnosis: Other abnormalities of gait and mobility (R26.89);History of falling (Z91.81);Difficulty in walking, not elsewhere classified (R26.2)    Time: [id number] PT Time Calculation (min) (ACUTE ONLY): 25 min   Charges:   PT Evaluation $  PT Eval Low Complexity: 1 Low PT Treatments $Therapeutic Activity: 8-22 mins        Baxter Flattery, PT  Acute Rehab Dept ([facility]) [phone] Pager [phone]  07/28/2021   [facility] 07/28/2021, 2:57 PM

## 2021-07-29 ENCOUNTER — Encounter (HOSPITAL_COMMUNITY): Payer: Self-pay | Admitting: Orthopedic Surgery

## 2021-07-29 DIAGNOSIS — E440 Moderate protein-calorie malnutrition: Secondary | ICD-10-CM | POA: Insufficient documentation

## 2021-07-29 DIAGNOSIS — S72001A Fracture of unspecified part of neck of right femur, initial encounter for closed fracture: Secondary | ICD-10-CM | POA: Diagnosis not present

## 2021-07-29 DIAGNOSIS — F0390 Unspecified dementia without behavioral disturbance: Secondary | ICD-10-CM | POA: Diagnosis not present

## 2021-07-29 LAB — CBC WITH DIFFERENTIAL/PLATELET
Abs Immature Granulocytes: 0.06 10*3/uL (ref 0.00–0.07)
Basophils Absolute: 0 10*3/uL (ref 0.0–0.1)
Basophils Relative: 0 %
Eosinophils Absolute: 0.2 10*3/uL (ref 0.0–0.5)
Eosinophils Relative: 2 %
HCT: 39.4 % (ref 39.0–52.0)
Hemoglobin: 13.7 g/dL (ref 13.0–17.0)
Immature Granulocytes: 1 %
Lymphocytes Relative: 8 %
Lymphs Abs: 0.8 10*3/uL (ref 0.7–4.0)
MCH: 29.3 pg (ref 26.0–34.0)
MCHC: 34.8 g/dL (ref 30.0–36.0)
MCV: 84.4 fL (ref 80.0–100.0)
Monocytes Absolute: 1 10*3/uL (ref 0.1–1.0)
Monocytes Relative: 10 %
Neutro Abs: 8.2 10*3/uL — ABNORMAL HIGH (ref 1.7–7.7)
Neutrophils Relative %: 79 %
Platelets: 105 10*3/uL — ABNORMAL LOW (ref 150–400)
RBC: 4.67 MIL/uL (ref 4.22–5.81)
RDW: 14 % (ref 11.5–15.5)
WBC: 10.3 10*3/uL (ref 4.0–10.5)
nRBC: 0 % (ref 0.0–0.2)

## 2021-07-29 LAB — BASIC METABOLIC PANEL
Anion gap: 6 (ref 5–15)
BUN: 20 mg/dL (ref 8–23)
CO2: 25 mmol/L (ref 22–32)
Calcium: 8.8 mg/dL — ABNORMAL LOW (ref 8.9–10.3)
Chloride: 107 mmol/L (ref 98–111)
Creatinine, Ser: 0.85 mg/dL (ref 0.61–1.24)
GFR, Estimated: >60 mL/min (ref >60)
Glucose, Bld: 87 mg/dL (ref 70–99)
Potassium: 4.4 mmol/L (ref 3.5–5.1)
Sodium: 138 mmol/L (ref 135–145)

## 2021-07-29 LAB — MAGNESIUM: Magnesium: 2.1 mg/dL (ref 1.7–2.4)

## 2021-07-29 MED ORDER — KATE FARMS STANDARD 1.4 PO LIQD
325.0000 mL | Freq: Two times a day (BID) | ORAL | Status: DC
  Administered 2021-07-29 – 2021-08-01 (×5): 325 mL via ORAL
  Filled 2021-07-29 (×7): qty 325

## 2021-07-29 MED ORDER — ADULT MULTIVITAMIN W/MINERALS CH
1.0000 | ORAL_TABLET | Freq: Every day | ORAL | Status: DC
  Administered 2021-07-29 – 2021-08-01 (×4): 1 via ORAL
  Filled 2021-07-29 (×4): qty 1

## 2021-07-29 NOTE — Assessment & Plan Note (Addendum)
-   Patients BMI is Body mass index is 19.6 kg/m.. - Patient has the following signs/symptoms consistent with PCM: (fat loss, muscle loss, muscle wasting). - seen by RD, appreciate assistance. Continue plan per RD to include protein supplements, multivitamins -Wife has patient on a vegan diet

## 2021-07-29 NOTE — Care Management Important Message (Signed)
Important Message  Patient Details IM Letter placed in Patients room. Name: Edwin [name] MRN: [id number] Date of Birth: [birth year]   Medicare Important Message Given:  Yes     Caren Macadam 07/29/2021, 1:52 PM

## 2021-07-29 NOTE — TOC Progression Note (Signed)
Transition of Care [facility]) - Progression Note   Patient Details  Name: Edwin [name] MRN: [id number] Date of Birth: 1934/03/08  Transition of Care [facility]) CM/SW [place], LCSW Phone Number: 07/29/2021, 3:23 PM  Clinical Narrative: CSW spoke with patient's spouse, Edwin [name], to discuss bed offers. Wife visited [facility] and is agreeable to the bed offer. CSW spoke with Neoma Laming in admissions at [facility] to confirm bed acceptance. Facility to start insurance authorization. TOC awaiting insurance authorization.  Expected Discharge Plan: [place] Barriers to Discharge: Continued Medical Work up  Expected Discharge Plan and Services Expected Discharge Plan: [place] arrangements for the past 2 months: Single Family Home  Readmission Risk Interventions No flowsheet data found.

## 2021-07-29 NOTE — Progress Notes (Addendum)
° ° °  Subjective: Patient unable to contribute to history due to dementia. Wife not at bedside. When moved, complains of hip pain. Tolerating diet. Has to be hand fed. Urinating. Working with PT/OT on mobilizing OOB. Having difficulty due to baseline dementia. Removed his surgical bandage.   Objective:   VITALS:   Vitals:   07/28/21 1634 07/28/21 1636 07/28/21 2059 07/29/21 0453  BP: 140/83 131/82 127/72 130/85  Pulse: 96 88 89 82  Resp:   17 17  Temp:   99 F (37.2 C) 98.6 F (37 C)  TempSrc:   Oral Oral  SpO2: 96%  100% 100%  Weight:      Height:       CBC Latest Ref Rng & Units 07/29/2021 07/28/2021 07/28/2021  WBC 4.0 - 10.5 K/uL 10.3 - 10.8(H)  Hemoglobin 13.0 - 17.0 g/dL 13.7 13.0 12.8(L)  Hematocrit 39.0 - 52.0 % 39.4 39.1 38.2(L)  Platelets 150 - 400 K/uL 105(L) - 114(L)   BMP Latest Ref Rng & Units 07/29/2021 07/28/2021 07/27/2021  Glucose 70 - 99 mg/dL 87 129(H) 89  BUN 8 - 23 mg/dL 20 16 13   Creatinine 0.61 - 1.24 mg/dL 0.85 1.03 1.06  Sodium 135 - 145 mmol/L 138 141 143  Potassium 3.5 - 5.1 mmol/L 4.4 4.2 3.7  Chloride 98 - 111 mmol/L 107 109 110  CO2 22 - 32 mmol/L 25 26 29   Calcium 8.9 - 10.3 mg/dL 8.8(L) 9.2 9.6   Intake/Output      02/20 0701 02/21 0700 02/21 0701 02/22 0700   P.O. 958    I.V. (mL/kg) 3 (0)    IV Piggyback 101.6    Total Intake(mL/kg) 1062.6 (16.7)    Urine (mL/kg/hr) 750 (0.5)    Stool 0    Blood     Total Output 750    Net +312.6         Urine Occurrence 3 x       Physical Exam: General: NAD.  Laying in bed. Slightly agitated. About to be fed breakfast. Resp: No increased wob Cardio: regular rate and rhythm ABD soft MSK Neurovascularly intact Sensation intact distally Intact pulses distally Dorsiflexion/Plantar flexion intact Incision: dressing missing since patient removed it, steri strips over part of the incision, replaced a new bandage   Assessment: 2 Days Post-Op  S/P Procedure(s) (LRB): ARTHROPLASTY BIPOLAR HIP  (HEMIARTHROPLASTY) (Right) by Dr. Ernesta Amble. Murphy on 07/27/21  Principal Problem:   Closed right hip fracture [facility]) Active Problems:   Dementia without behavioral disturbance ([place])    Plan: Prevent patient from pulling at surgical bandage. May want to consider activity vest or mittens  Advance diet Up with therapy Incentive Spirometry Elevate and Apply ice  Weightbearing: WBAT RLE Insicional and dressing care: Dressings left intact until follow-up and Reinforce dressings as needed Orthopedic device(s): None Showering: Keep dressing dry VTE prophylaxis: Lovenox 40mg  qd  , will need to either continue it after d/c or do ASA 81mg  bid x 30 days , SCDs, ambulation Pain control: continue current regimen, minimize narcotics Follow - up plan: 2 weeks Contact information:  Edmonia Lynch MD, Aggie Moats PA-C  Dispo:  PT/OT recommending SNF.   Safe for d/c from ortho standpoint. Will print and sign scripts for Tylenol and ASA and place in chart.      Britt Bottom, PA-C Office [phone] 07/29/2021, 7:47 AM

## 2021-07-29 NOTE — Plan of Care (Signed)
  Problem: Education: Goal: Knowledge of General Education information will improve Description: Including pain rating scale, medication(s)/side effects and non-pharmacologic comfort measures Outcome: Progressing   Problem: Health Behavior/Discharge Planning: Goal: Ability to manage health-related needs will improve Outcome: Progressing   Problem: Clinical Measurements: Goal: Ability to maintain clinical measurements within normal limits will improve Outcome: Progressing   Problem: Nutrition: Goal: Adequate nutrition will be maintained Outcome: Progressing   Problem: Coping: Goal: Level of anxiety will decrease Outcome: Progressing   Problem: Elimination: Goal: Will not experience complications related to bowel motility Outcome: Progressing   Problem: Pain Managment: Goal: General experience of comfort will improve Outcome: Progressing   Problem: Safety: Goal: Ability to remain free from injury will improve Outcome: Progressing   

## 2021-07-29 NOTE — Progress Notes (Signed)
Progress Note   Patient: Edwin [name] [id number] DOB: 1934/03/16 DOA: 07/26/2021     3 DOS: the patient was seen and examined on 07/29/2021   Brief hospital course: Edwin [name] is an 88 yo male with PMH dementia who presented to the ER after a mechanical fall.  History is provided by his wife in the ER as patient cannot provide collateral information due to his dementia.  They were in [place] in a hotel and upon getting out of bed in the middle of the night, he had a fall.  She immediately brought him back home to [facility] to the hospital for evaluation. X-rays were obtained which showed impacted fracture involving subcapital portion of the neck of the right femur. Orthopedic surgery was consulted and wife was amenable for surgical repair. Typically patient is ambulatory on a normal basis and can go up a flight of stairs without difficulty.  Other than his dementia he has no other major medical problems and takes no daily medications.  Assessment and Plan: * Closed right hip fracture ([place])- (present on admission) - Closed right neck of femur fracture due to mechanical fall -Continue pain control - s/p right hemiarthroplasty on 07/27/2021; weightbearing as tolerated to right lower extremity per orthopedic surgery - Continue PT/OT - Continue Lovenox for DVT prophylaxis; plan for asa 81 mg BID x 30 days at discharge if wife is amenable to treatment - wife is particular about certain medications as they practice a "natural" lifestyle free of medications   Malnutrition of moderate degree - Patients BMI is Body mass index is 19.6 kg/m.. - Patient has the following signs/symptoms consistent with PCM: (fat loss, muscle loss, muscle wasting). - seen by RD, appreciate assistance. Continue plan per RD to include protein supplements, multivitamins -Wife has patient on a vegan diet  Dementia without behavioral disturbance (Edwin [name]) -Continue delirium precautions as well    Subjective: Pulled  his surgical dressings off overnight.  They were replaced this morning.  Otherwise he was comfortable, still having some pain at surgical site as expected.  Physical Exam: Vitals:   07/28/21 1636 07/28/21 2059 07/29/21 0453 07/29/21 1053  BP: 131/82 127/72 130/85 112/72  Pulse: 88 89 82 85  Resp:  17 17 15   Temp:  99 F (37.2 C) 98.6 F (37 C) (!) 97.5 F (36.4 C)  TempSrc:  Oral Oral Oral  SpO2:  100% 100% 99%  Weight:      Height:       Physical Exam Constitutional:      General: He is not in acute distress.    Appearance: Normal appearance.  HENT:     Head: Normocephalic and atraumatic.     Mouth/Throat:     Mouth: Mucous membranes are moist.  Eyes:     Extraocular Movements: Extraocular movements intact.  Cardiovascular:     Rate and Rhythm: Normal rate and regular rhythm.     Heart sounds: Normal heart sounds.  Pulmonary:     Effort: Pulmonary effort is normal. No respiratory distress.     Breath sounds: Normal breath sounds. No wheezing.  Abdominal:     General: Bowel sounds are normal. There is no distension.     Palpations: Abdomen is soft.     Tenderness: There is no abdominal tenderness.  Musculoskeletal:     Cervical back: Normal range of motion and neck supple.     Comments: Surgical dressing in place on right hip.  Compartments soft  Skin:    General: Skin is  warm and dry.  Neurological:     Mental Status: He is alert.     Comments: Oriented to first name only. Dementia appreciated  Psychiatric:        Mood and Affect: Mood normal.        Behavior: Behavior normal.     Data Reviewed:  Results for orders placed or performed during the hospital encounter of 07/26/21 (from the past 24 hour(s))  Hemoglobin and hematocrit, blood     Status: None   Collection Time: 07/28/21  3:31 PM  Result Value Ref Range   Hemoglobin 13.0 13.0 - 17.0 g/dL   HCT 39.1 39.0 - [id number] %  Basic metabolic panel     Status: Abnormal   Collection Time: 07/29/21  3:41 AM   Result Value Ref Range   Sodium 138 135 - 145 mmol/L   Potassium 4.4 3.5 - 5.1 mmol/L   Chloride 107 98 - 111 mmol/L   CO2 25 22 - 32 mmol/L   Glucose, Bld 87 70 - 99 mg/dL   BUN 20 8 - 23 mg/dL   Creatinine, Ser 0.85 0.61 - 1.24 mg/dL   Calcium 8.8 (L) 8.9 - 10.3 mg/dL   GFR, Estimated >60 >60 mL/min   Anion gap 6 5 - 15  CBC with Differential/Platelet     Status: Abnormal   Collection Time: 07/29/21  3:41 AM  Result Value Ref Range   WBC 10.3 4.0 - 10.5 K/uL   RBC 4.67 4.22 - 5.81 MIL/uL   Hemoglobin 13.7 13.0 - 17.0 g/dL   HCT 39.4 39.0 - 52.0 %   MCV 84.4 80.0 - 100.0 fL   MCH 29.3 26.0 - 34.0 pg   MCHC 34.8 30.0 - 36.0 g/dL   RDW 14.0 11.5 - 15.5 %   Platelets 105 (L) 150 - 400 K/uL   nRBC 0.0 0.0 - 0.2 %   Neutrophils Relative % 79 %   Neutro Abs 8.2 (H) 1.7 - 7.7 K/uL   Lymphocytes Relative 8 %   Lymphs Abs 0.8 0.7 - 4.0 K/uL   Monocytes Relative 10 %   Monocytes Absolute 1.0 0.1 - 1.0 K/uL   Eosinophils Relative 2 %   Eosinophils Absolute 0.2 0.0 - 0.5 K/uL   Basophils Relative 0 %   Basophils Absolute 0.0 0.0 - 0.1 K/uL   Immature Granulocytes 1 %   Abs Immature Granulocytes 0.06 0.00 - 0.07 K/uL  Magnesium     Status: None   Collection Time: 07/29/21  3:41 AM  Result Value Ref Range   Magnesium 2.1 1.7 - 2.4 mg/dL    I have Reviewed nursing notes, Vitals, and Lab results since pt's last encounter. Pertinent lab results : see above I have ordered test including BMP, CBC, Mg I have reviewed the last note from staff over past 24 hours I have discussed pt's care plan and test results with nursing staff, case manager  Antimicrobials:   Consultants: Orthopedic surgery  Procedures:  Right hip hemiarthroplasty, 07/27/2021  DVT ppx:  enoxaparin (LOVENOX) injection 40 mg Start: 07/28/21 0800 SCDs Start: 07/27/21 1058     Code Status: Full Code   Family Communication: wife  Disposition: Status is: Inpatient Remains inpatient appropriate because:  Treatment as outlined in A&P    Planned Discharge Destination: Skilled nursing facility    Author: Dwyane Dee, MD 07/29/2021 1:01 PM  For on call review [email or web address].

## 2021-07-29 NOTE — Progress Notes (Signed)
Initial Nutrition Assessment  DOCUMENTATION CODES:   Non-severe (moderate) malnutrition in context of chronic illness  INTERVENTION:   -[employer] 1.4 PO BID, each provides 455 kcals and 20g protein  -Multivitamin with minerals daily  -Placed vegan diet information in discharge instructions   NUTRITION DIAGNOSIS:   Moderate Malnutrition related to chronic illness (dementia) as evidenced by mild fat depletion, moderate muscle depletion, energy intake < or equal to 75% for > or equal to 1 month.  GOAL:   Patient will meet greater than or equal to 90% of their needs  MONITOR:   PO intake, Supplement acceptance, Labs, I & O's  REASON FOR ASSESSMENT:   Consult Assessment of nutrition requirement/status  ASSESSMENT:   88 yo male with PMH dementia who presented to the ER after a mechanical fall. X-rays were obtained which showed impacted fracture involving subcapital portion of the neck of the right femur.  2/19: s/p ARTHROPLASTY BIPOLAR HIP (HEMIARTHROPLASTY)  Patient in room, no family at bedside. Pt attempts to answer RD's questions but seems to be a poor historian. Pt was unable to say how long he has followed the vegan diet. Pt states he does eat beans, was unable to state what type of milk he drinks at home. Seemed confused to be receiving soy milk on lunch tray.  States he has a good appetite. Consumed 90% of breakfast (which did contain eggs). Will order plant based supplement Molli Posey for kcals and protein.  Noted dietary restrictions: no dairy, no eggs, no meat, soda, tea, or lemonade. Pt does consume fish but only broiled or baked.  Placed diet information on vegan diet in discharge instructions. Concerned this pt is not taking daily MVI or Vitamin B-12.   Medications: Colace  Labs reviewed.  NUTRITION - FOCUSED PHYSICAL EXAM:  Flowsheet Row Most Recent Value  Orbital Region Mild depletion  Upper Arm Region Mild depletion  Thoracic and Lumbar Region Mild  depletion  Buccal Region Mild depletion  Temple Region Moderate depletion  Clavicle Bone Region Moderate depletion  Clavicle and Acromion Bone Region Moderate depletion  Scapular Bone Region Moderate depletion  Dorsal Hand Mild depletion  Patellar Region Mild depletion  Anterior Thigh Region Mild depletion  Posterior Calf Region Mild depletion  Edema (RD Assessment) None  Hair Unable to assess  [hat]  Eyes Reviewed  Mouth Reviewed  Skin Reviewed  [dry]       Diet Order:   Diet Order             Diet vegetarian Room service appropriate? Yes; Fluid consistency: Thin  Diet effective now                   EDUCATION NEEDS:   Education needs have been addressed  Skin:  Skin Assessment: Skin Integrity Issues: Skin Integrity Issues:: Incisions Incisions: 2/19 right hip  Last BM:  2/20  Height:   Ht Readings from Last 1 Encounters:  07/27/21 5\' 11"  (1.803 m)    Weight:   Wt Readings from Last 1 Encounters:  07/27/21 63.7 kg    BMI:  Body mass index is 19.6 kg/m.  Estimated Nutritional Needs:   Kcal:  1900-2100  Protein:  95-105g  Fluid:  2L/day  07/29/21, MS, RD, LDN Inpatient Clinical Dietitian Contact information available via [employer]

## 2021-07-30 DIAGNOSIS — E440 Moderate protein-calorie malnutrition: Secondary | ICD-10-CM

## 2021-07-30 LAB — CBC WITH DIFFERENTIAL/PLATELET
Abs Immature Granulocytes: 0.04 10*3/uL (ref 0.00–0.07)
Basophils Absolute: 0 10*3/uL (ref 0.0–0.1)
Basophils Relative: 0 %
Eosinophils Absolute: 0.2 10*3/uL (ref 0.0–0.5)
Eosinophils Relative: 3 %
HCT: 41.6 % (ref 39.0–52.0)
Hemoglobin: 14.1 g/dL (ref 13.0–17.0)
Immature Granulocytes: 1 %
Lymphocytes Relative: 8 %
Lymphs Abs: 0.6 10*3/uL — ABNORMAL LOW (ref 0.7–4.0)
MCH: 29.1 pg (ref 26.0–34.0)
MCHC: 33.9 g/dL (ref 30.0–36.0)
MCV: 85.8 fL (ref 80.0–100.0)
Monocytes Absolute: 0.9 10*3/uL (ref 0.1–1.0)
Monocytes Relative: 12 %
Neutro Abs: 5.8 10*3/uL (ref 1.7–7.7)
Neutrophils Relative %: 76 %
Platelets: 103 10*3/uL — ABNORMAL LOW (ref 150–400)
RBC: 4.85 MIL/uL (ref 4.22–5.81)
RDW: 14.1 % (ref 11.5–15.5)
WBC: 7.6 10*3/uL (ref 4.0–10.5)
nRBC: 0 % (ref 0.0–0.2)

## 2021-07-30 LAB — BASIC METABOLIC PANEL
Anion gap: 4 — ABNORMAL LOW (ref 5–15)
BUN: 16 mg/dL (ref 8–23)
CO2: 29 mmol/L (ref 22–32)
Calcium: 9.1 mg/dL (ref 8.9–10.3)
Chloride: 103 mmol/L (ref 98–111)
Creatinine, Ser: 0.83 mg/dL (ref 0.61–1.24)
GFR, Estimated: >60 mL/min (ref >60)
Glucose, Bld: 98 mg/dL (ref 70–99)
Potassium: 4.2 mmol/L (ref 3.5–5.1)
Sodium: 136 mmol/L (ref 135–145)

## 2021-07-30 LAB — MAGNESIUM: Magnesium: 2.2 mg/dL (ref 1.7–2.4)

## 2021-07-30 MED ORDER — ACETAMINOPHEN 500 MG PO TABS: 500.0000 mg | ORAL_TABLET | Freq: Four times a day (QID) | ORAL | 0 refills | Status: DC | PRN

## 2021-07-30 MED ORDER — ASPIRIN EC 81 MG PO TBEC: 81.0000 mg | DELAYED_RELEASE_TABLET | Freq: Two times a day (BID) | ORAL | 0 refills | Status: DC

## 2021-07-30 NOTE — Progress Notes (Signed)
Physical Therapy Treatment Patient Details Name: Edwin [name] MRN: [id number] DOB: Oct 24, 1933 Today's Date: 07/30/2021   History of Present Illness 88 yo male with who presented to the ER after a mechanical fall.   X-rays were obtained which showed impacted fracture involving subcapital portion of the neck of the right femur.  Orthopedic surgery was consulted and wife was amenable for surgical repair. pt is s/p R posterior hemiarthroplasty on 07/27/21 per Dr Percell Miller.  PMH dementia    PT Comments    Pt is progressing toward acute PT goals this session with progression to ambulation. Pt ambulated ~11ft total with MIN A +2 initially, able to progress to +1 assist but requires MOD-MAX A for RW management and adhering to posterior hip precautions during all mobility due to baseline cognitive deficits, pt unable to recall precautions and demonstrates difficulty with problem solving. PT reviewed LE HEP, pt required verbal and tactile cuing for proper technique with all interventions. Pt will benefit from continued skilled PT to increase their independence and maximize safety with mobility.     Recommendations for follow up therapy are one component of a multi-disciplinary discharge planning process, led by the attending physician.  Recommendations may be updated based on patient status, additional functional criteria and insurance authorization.  Follow Up Recommendations  Skilled nursing-short term rehab (<3 hours/day)     Assistance Recommended at Discharge Frequent or constant Supervision/Assistance  Patient can return home with the following Assistance with cooking/housework;Direct supervision/assist for financial management;Assist for transportation;Help with stairs or ramp for entrance;Direct supervision/assist for medications management;A lot of help with bathing/dressing/bathroom;Two people to help with walking and/or transfers   Equipment Recommendations  Rolling walker (2 wheels) (or defer  to SNF)    Recommendations for Other Services       Precautions / Restrictions Precautions Precautions: Fall;Posterior Hip Precaution Comments: precaution handout in room and listed on whiteboard, has been reviewed with wife in prior session, wife not present during treatment session today Restrictions Weight Bearing Restrictions: No     Mobility  Bed Mobility Overal bed mobility: Needs Assistance Bed Mobility: Supine to Sit     Supine to sit: Max assist, +2 for physical assistance, +2 for safety/equipment     General bed mobility comments: pt able to assist with anterior scooting intermittently, bed pad used to pivot to EOB, assist with RLE.    Transfers Overall transfer level: Needs assistance Equipment used: Rolling walker (2 wheels) Transfers: Sit to/from Stand, Bed to chair/wheelchair/BSC Sit to Stand: +2 safety/equipment, Min assist, From elevated surface, +2 physical assistance           General transfer comment: x2 from EOB and BSC. Able to use B UEs for power up, bed surface elevated to assist with maintaining posterior hip precautions with transfer.  Improved initiation with power up to stand from [facility]. physical assist to maintain and follow  precautions. Assist to balance, pivot and maneuver RW, especially with turns.    Ambulation/Gait Ambulation/Gait assistance: Min assist, +2 physical assistance, +2 safety/equipment, Mod assist Gait Distance (Feet): 32 Feet   Gait Pattern/deviations: Step-to pattern, Decreased stride length, Decreased weight shift to right Gait velocity: decr     General Gait Details: MIN A+2 initially for safety and pt requiring physical assist with RW and to maintain precautions, especially with turns. Able to progress to MOD +1 assist for stability and RW management and ambulated total of ~53ft around room. Repeated multimodal cuing for goal of ambulating to window or back to recliner chair  and hand over hand guidance required for hand  placement on RW intermittently as pt would remove hands from RW while standing.   Stairs             Wheelchair Mobility    Modified Rankin (Stroke Patients Only)       Balance Overall balance assessment: Needs assistance Sitting-balance support: Feet supported Sitting balance-Leahy Scale: Fair     Standing balance support: Bilateral upper extremity supported, Single extremity supported, During functional activity Standing balance-Leahy Scale: Poor                              Cognition Arousal/Alertness: Awake/alert Behavior During Therapy: WFL for tasks assessed/performed Overall Cognitive Status: History of cognitive impairments - at baseline Area of Impairment: Following commands, Safety/judgement, Problem solving, Memory                     Memory: Decreased recall of precautions Following Commands: Follows one step commands inconsistently Safety/Judgement: Decreased awareness of deficits   Problem Solving: Slow processing, Requires verbal cues, Requires tactile cues, Difficulty sequencing General Comments: pt is oriented to self only, requires hand over hand assist to follow one step commands. Unable to recall precautions or why he is in hospital        Exercises Total Joint Exercises Ankle Circles/Pumps: AROM, Both, 15 reps, Seated Quad Sets: AROM, Right, 10 reps, Seated (tactile cuing required for proper technique) Short Arc Quad: AAROM, 10 reps, Seated, Right Hip ABduction/ADduction: AAROM, Right, 10 reps, Seated    General Comments        Pertinent Vitals/Pain Pain Assessment Pain Assessment: PAINAD Breathing: normal Negative Vocalization: occasional moan/groan, low speech, negative/disapproving quality Facial Expression: smiling or inexpressive Body Language: relaxed Consolability: no need to console PAINAD Score: 1 Pain Location: right hip Pain Descriptors / Indicators: Guarding Pain Intervention(s): Limited activity  within patient's tolerance, Monitored during session, Repositioned    Home Living                     Additional Comments: left precaution handout and listed on whiteboard, reviewed with wife    Prior Function            PT Goals (current goals can now be found in the care plan section) Acute Rehab PT Goals Patient Stated Goal: per wife--pt to go to rehab PT Goal Formulation: With family Time For Goal Achievement: 08/11/21 Potential to Achieve Goals: Good Progress towards PT goals: Progressing toward goals    Frequency    Min 3X/week      PT Plan Current plan remains appropriate    Co-evaluation              AM-PAC PT "6 Clicks" Mobility   Outcome Measure  Help needed turning from your back to your side while in a flat bed without using bedrails?: Total Help needed moving from lying on your back to sitting on the side of a flat bed without using bedrails?: Total Help needed moving to and from a bed to a chair (including a wheelchair)?: A Lot Help needed standing up from a chair using your arms (e.g., wheelchair or bedside chair)?: Total Help needed to walk in hospital room?: A Lot Help needed climbing 3-5 steps with a railing? : Total 6 Click Score: 8    End of Session Equipment Utilized During Treatment: Gait belt Activity Tolerance: Patient tolerated treatment well Patient left: in  chair;with call bell/phone within reach;with chair alarm set (in elevated reclined seat) Nurse Communication: Mobility status PT Visit Diagnosis: Other abnormalities of gait and mobility (R26.89);History of falling (Z91.81);Difficulty in walking, not elsewhere classified (R26.2)     Time: [id number] PT Time Calculation (min) (ACUTE ONLY): 42 min  Charges:  $Therapeutic Exercise: 8-22 mins $Therapeutic Activity: 23-37 mins                    Festus Barren PT, DPT  Acute Rehabilitation Services  Office [phone]   07/30/2021, 11:13 AM

## 2021-07-30 NOTE — Progress Notes (Signed)
[facility] PROGRESS NOTE    Progress Note  Edwin [name]  [id number] DOB: March 14, 1934 DOA: 07/26/2021 PCP: Pcp, No     Brief Narrative:   Edwin [name] is an 88 y.o. male with medical history of dementia comes into the ED with mechanical fall leading to closed right hip fracture    Assessment/Plan:   Closed right hip fracture ([place]) Status post right hemiarthroplasty on 07/27/2021 weightbearing as tolerated. Physical therapy has been consulted, patient will need to go to skilled nursing facility. Lovenox for DVT prophylaxis while in house aspirin twice a day for 30 days upon discharge. Patient is awaiting insurance authorization.  Dementia without behavioral disturbance ([place]) Continue delirium precautions she is at high risk of aspiration.  Malnutrition of moderate degree BMI of 18 continue Ensure 3 times daily.    DVT prophylaxis: lovenoxn Family Communication:one Status is: Inpatient Remains inpatient appropriate because: Awaiting insurance authorization for skilled nursing facility placement      Code Status:     Code Status Orders  (From admission, onward)           Start     Ordered   07/26/21 1958  Full code  Continuous        07/26/21 1957           Code Status History     This patient has a current code status but no historical code status.      Advance Directive Documentation    Flowsheet Row Most Recent Value  Type of Advance Directive Healthcare Power of Attorney, Living will  Pre-existing out of facility DNR order (yellow form or pink MOST form) --  "MOST" Form in Place? --         IV Access:   Peripheral IV   Procedures and diagnostic studies:   No results found.   Medical Consultants:   None.   Subjective:    Edwin [name] no complaints  Objective:    Vitals:   07/29/21 0453 07/29/21 1053 07/29/21 2143 07/30/21 0619  BP: 130/85 112/72 132/88 121/81  Pulse: 82 85 96 77  Resp: 17 15 16 17    Temp: 98.6 F (37 C) (!) 97.5 F (36.4 C) 99.6 F (37.6 C) 98.2 F (36.8 C)  TempSrc: Oral Oral Oral Oral  SpO2: 100% 99% 97% 99%  Weight:      Height:       SpO2: 99 % O2 Flow Rate (L/min): 2 L/min   Intake/Output Summary (Last 24 hours) at 07/30/2021 1155 Last data filed at 07/30/2021 0831 Gross per 24 hour  Intake 360 ml  Output --  Net 360 ml   Filed Weights   07/27/21 1330  Weight: 63.7 kg    Exam: General exam: In no acute distress. Respiratory system: Good air movement and clear to auscultation. Cardiovascular system: S1 & S2 heard, RRR. No JVD.  Gastrointestinal system: Abdomen is nondistended, soft and nontender.  Extremities: No pedal edema. Skin: No rashes, lesions or ulcers Psychiatry: Judgement and insight appear normal. Mood & affect appropriate.    Data Reviewed:    Labs: Basic Metabolic Panel: Recent Labs  Lab 07/26/21 1544 07/27/21 0644 07/28/21 0323 07/29/21 0341 07/30/21 0341  NA 142 143 141 138 136  K 3.9 3.7 4.2 4.4 4.2  CL 109 110 109 107 103  CO2 29 29 26 25 29   GLUCOSE 114* 89 129* 87 98  BUN 18 13 16 20 16   CREATININE 1.09 1.06 1.03 0.85 0.83  CALCIUM 9.4 9.6  9.2 8.8* 9.1  MG  --  2.2 2.1 2.1 2.2   GFR Estimated Creatinine Clearance: 58.6 mL/min (by C-G formula based on SCr of 0.83 mg/dL). Liver Function Tests: No results for input(s): AST, ALT, ALKPHOS, BILITOT, PROT, ALBUMIN in the last 168 hours. No results for input(s): LIPASE, AMYLASE in the last 168 hours. No results for input(s): AMMONIA in the last 168 hours. Coagulation profile Recent Labs  Lab 07/26/21 1544  INR 1.3*   COVID-19 Labs  No results for input(s): DDIMER, FERRITIN, LDH, CRP in the last 72 hours.  Lab Results  Component Value Date   SARSCOV2NAA NEGATIVE 07/26/2021   [place] NEGATIVE 03/14/2021    CBC: Recent Labs  Lab 07/26/21 1544 07/27/21 0311 07/28/21 0323 07/28/21 1531 07/29/21 0341 07/30/21 0341  WBC 9.3 8.0 10.8*  --  10.3  7.6  NEUTROABS 8.1* 6.6 9.5*  --  8.2* 5.8  HGB 13.7 14.5 12.8* 13.0 13.7 14.1  HCT 41.0 45.9 38.2* 39.1 39.4 41.6  MCV 86.7 91.4 86.8  --  84.4 85.8  PLT 128* 85* 114*  --  105* 103*   Cardiac Enzymes: No results for input(s): CKTOTAL, CKMB, CKMBINDEX, TROPONINI in the last 168 hours. BNP (last 3 results) No results for input(s): PROBNP in the last 8760 hours. CBG: No results for input(s): GLUCAP in the last 168 hours. D-Dimer: No results for input(s): DDIMER in the last 72 hours. Hgb A1c: No results for input(s): HGBA1C in the last 72 hours. Lipid Profile: No results for input(s): CHOL, HDL, LDLCALC, TRIG, CHOLHDL, LDLDIRECT in the last 72 hours. Thyroid function studies: No results for input(s): TSH, T4TOTAL, T3FREE, THYROIDAB in the last 72 hours.  Invalid input(s): FREET3 Anemia work up: No results for input(s): VITAMINB12, FOLATE, FERRITIN, TIBC, IRON, RETICCTPCT in the last 72 hours. Sepsis Labs: Recent Labs  Lab 07/27/21 0311 07/28/21 0323 07/29/21 0341 07/30/21 0341  WBC 8.0 10.8* 10.3 7.6   Microbiology Recent Results (from the past 240 hour(s))  Resp Panel by RT-PCR (Flu A&B, Covid) Nasopharyngeal Swab     Status: None   Collection Time: 07/26/21  4:11 PM   Specimen: Nasopharyngeal Swab; Nasopharyngeal(NP) swabs in vial transport medium  Result Value Ref Range Status   SARS Coronavirus 2 by RT PCR NEGATIVE NEGATIVE Final    Comment: (NOTE) SARS-CoV-2 target nucleic acids are NOT DETECTED.  The SARS-CoV-2 RNA is generally detectable in upper respiratory specimens during the acute phase of infection. The lowest concentration of SARS-CoV-2 viral copies this assay can detect is 138 copies/mL. A negative result does not preclude SARS-Cov-2 infection and should not be used as the sole basis for treatment or other patient management decisions. A negative result may occur with  improper specimen collection/handling, submission of specimen other than  nasopharyngeal swab, presence of viral mutation(s) within the areas targeted by this assay, and inadequate number of viral copies(<138 copies/mL). A negative result must be combined with clinical observations, patient history, and epidemiological information. The expected result is Negative.  Fact Sheet for Patients:  EntrepreneurPulse.com.au  Fact Sheet for Healthcare Providers:  IncredibleEmployment.be  This test is no t yet approved or cleared by the [place] FDA and  has been authorized for detection and/or diagnosis of SARS-CoV-2 by FDA under an Emergency Use Authorization (EUA). This EUA will remain  in effect (meaning this test can be used) for the duration of the COVID-19 declaration under Section 564(b)(1) of the Act, 21 U.S.C.section 360bbb-3(b)(1), unless the authorization is terminated  or  revoked sooner.       Influenza A by PCR NEGATIVE NEGATIVE Final   Influenza B by PCR NEGATIVE NEGATIVE Final    Comment: (NOTE) The Xpert Xpress SARS-CoV-2/FLU/RSV plus assay is intended as an aid in the diagnosis of influenza from Nasopharyngeal swab specimens and should not be used as a sole basis for treatment. Nasal washings and aspirates are unacceptable for Xpert Xpress SARS-CoV-2/FLU/RSV testing.  Fact Sheet for Patients: EntrepreneurPulse.com.au  Fact Sheet for Healthcare Providers: IncredibleEmployment.be  This test is not yet approved or cleared by the [place] FDA and has been authorized for detection and/or diagnosis of SARS-CoV-2 by FDA under an Emergency Use Authorization (EUA). This EUA will remain in effect (meaning this test can be used) for the duration of the COVID-19 declaration under Section 564(b)(1) of the Act, 21 U.S.C. section 360bbb-3(b)(1), unless the authorization is terminated or revoked.  Performed at [facility], [place] [address] [place], [place] [address]   Surgical pcr screen     Status: None   Collection Time: 07/27/21  5:00 AM   Specimen: Nasal Mucosa; Nasal Swab  Result Value Ref Range Status   MRSA, PCR NEGATIVE NEGATIVE Final   Staphylococcus aureus NEGATIVE NEGATIVE Final    Comment: (NOTE) The Xpert SA Assay (FDA approved for NASAL specimens in patients 72 years of age and older), is one component of a comprehensive surveillance program. It is not intended to diagnose infection nor to guide or monitor treatment. Performed at [facility], [place] [address], [place], [place] [address]      Medications:    docusate sodium  100 mg Oral BID   enoxaparin (LOVENOX) injection  40 mg Subcutaneous Q24H   feeding supplement (KATE FARMS STANDARD 1.4)  325 mL Oral BID BM   multivitamin with minerals  1 tablet Oral Daily   sodium chloride flush  3 mL Intravenous Q12H   Continuous Infusions:  sodium chloride 10 mL/hr at 07/27/21 1703      LOS: 4 days   Charlynne Cousins  [facility]  07/30/2021, 11:55 AM

## 2021-07-31 LAB — CBC WITH DIFFERENTIAL/PLATELET
Abs Immature Granulocytes: 0.03 10*3/uL (ref 0.00–0.07)
Basophils Absolute: 0 10*3/uL (ref 0.0–0.1)
Basophils Relative: 0 %
Eosinophils Absolute: 0.2 10*3/uL (ref 0.0–0.5)
Eosinophils Relative: 3 %
HCT: 39.2 % (ref 39.0–52.0)
Hemoglobin: 13.4 g/dL (ref 13.0–17.0)
Immature Granulocytes: 1 %
Lymphocytes Relative: 16 %
Lymphs Abs: 1 10*3/uL (ref 0.7–4.0)
MCH: 28.7 pg (ref 26.0–34.0)
MCHC: 34.2 g/dL (ref 30.0–36.0)
MCV: 83.9 fL (ref 80.0–100.0)
Monocytes Absolute: 0.8 10*3/uL (ref 0.1–1.0)
Monocytes Relative: 12 %
Neutro Abs: 4.4 10*3/uL (ref 1.7–7.7)
Neutrophils Relative %: 68 %
Platelets: 132 10*3/uL — ABNORMAL LOW (ref 150–400)
RBC: 4.67 MIL/uL (ref 4.22–5.81)
RDW: 13.7 % (ref 11.5–15.5)
WBC: 6.4 10*3/uL (ref 4.0–10.5)
nRBC: 0 % (ref 0.0–0.2)

## 2021-07-31 LAB — BASIC METABOLIC PANEL
Anion gap: 5 (ref 5–15)
BUN: 17 mg/dL (ref 8–23)
CO2: 28 mmol/L (ref 22–32)
Calcium: 8.7 mg/dL — ABNORMAL LOW (ref 8.9–10.3)
Chloride: 104 mmol/L (ref 98–111)
Creatinine, Ser: 0.9 mg/dL (ref 0.61–1.24)
GFR, Estimated: >60 mL/min (ref >60)
Glucose, Bld: 86 mg/dL (ref 70–99)
Potassium: 4.2 mmol/L (ref 3.5–5.1)
Sodium: 137 mmol/L (ref 135–145)

## 2021-07-31 LAB — MAGNESIUM: Magnesium: 2.1 mg/dL (ref 1.7–2.4)

## 2021-07-31 NOTE — Progress Notes (Signed)
[facility] PROGRESS NOTE    Progress Note  Quamar Scheidecker  [id number] DOB: [birth year] DOA: 07/26/2021 PCP: Pcp, No     Brief Narrative:   Edwin [name] is an 88 y.o. male with medical history of dementia comes into the ED with mechanical fall leading to closed right hip fracture    Assessment/Plan:   Closed right hip fracture ([place]) Status post right hemiarthroplasty on 07/27/2021 weightbearing as tolerated. Physical therapy has been consulted, patient will need to go to skilled nursing facility. Lovenox for DVT prophylaxis while in house aspirin twice a day for 30 days upon discharge. Patient is awaiting insurance authorization.  Dementia without behavioral disturbance ([place]) Continue delirium precautions she is at high risk of aspiration.  Malnutrition of moderate degree BMI of 18 continue Ensure 3 times daily.    DVT prophylaxis: lovenoxn Family Communication:one Status is: Inpatient Remains inpatient appropriate because: Awaiting insurance authorization for skilled nursing facility placement      Code Status:     Code Status Orders  (From admission, onward)           Start     Ordered   07/26/21 1958  Full code  Continuous        07/26/21 1957           Code Status History     This patient has a current code status but no historical code status.      Advance Directive Documentation    Flowsheet Row Most Recent Value  Type of Advance Directive Healthcare Power of Attorney, Living will  Pre-existing out of facility DNR order (yellow form or pink MOST form) --  "MOST" Form in Place? --         IV Access:   Peripheral IV   Procedures and diagnostic studies:   No results found.   Medical Consultants:   None.   Subjective:    Edwin [name] no complaints today, tolerating his diet.  Objective:    Vitals:   07/30/21 0619 07/30/21 1401 07/30/21 2140 07/31/21 0613  BP: 121/81 120/84 129/82 102/73  Pulse: 77  73 90 68  Resp: 17 20 16 16   Temp: 98.2 F (36.8 C) 98 F (36.7 C) 98.7 F (37.1 C) 97.7 F (36.5 C)  TempSrc: Oral Oral Oral   SpO2: 99% (!) 65% 97% 97%  Weight:      Height:       SpO2: 97 % O2 Flow Rate (L/min): 2 L/min   Intake/Output Summary (Last 24 hours) at 07/31/2021 0909 Last data filed at 07/31/2021 0600 Gross per 24 hour  Intake 300 ml  Output 300 ml  Net 0 ml    Filed Weights   07/27/21 1330  Weight: 63.7 kg    Exam: General exam: In no acute distress. Respiratory system: Good air movement and clear to auscultation. Cardiovascular system: S1 & S2 heard, RRR. No JVD. Gastrointestinal system: Abdomen is nondistended, soft and nontender.  Extremities: No pedal edema. Skin: No rashes, lesions or ulcers Psychiatry: Judgement and insight appear normal. Mood & affect appropriate.   Data Reviewed:    Labs: Basic Metabolic Panel: Recent Labs  Lab 07/27/21 0644 07/28/21 0323 07/29/21 0341 07/30/21 0341 07/31/21 0348  NA 143 141 138 136 137  K 3.7 4.2 4.4 4.2 4.2  CL 110 109 107 103 104  CO2 29 26 25 29 28   GLUCOSE 89 129* 87 98 86  BUN 13 16 20 16 17   CREATININE 1.06 1.03 0.85 0.83 0.90  CALCIUM 9.6 9.2 8.8* 9.1 8.7*  MG 2.2 2.1 2.1 2.2 2.1    GFR Estimated Creatinine Clearance: 54.1 mL/min (by C-G formula based on SCr of 0.9 mg/dL). Liver Function Tests: No results for input(s): AST, ALT, ALKPHOS, BILITOT, PROT, ALBUMIN in the last 168 hours. No results for input(s): LIPASE, AMYLASE in the last 168 hours. No results for input(s): AMMONIA in the last 168 hours. Coagulation profile Recent Labs  Lab 07/26/21 1544  INR 1.3*    COVID-19 Labs  No results for input(s): DDIMER, FERRITIN, LDH, CRP in the last 72 hours.  Lab Results  Component Value Date   SARSCOV2NAA NEGATIVE 07/26/2021   [place] NEGATIVE 03/14/2021    CBC: Recent Labs  Lab 07/27/21 0311 07/28/21 0323 07/28/21 1531 07/29/21 0341 07/30/21 0341 07/31/21 0348   WBC 8.0 10.8*  --  10.3 7.6 6.4  NEUTROABS 6.6 9.5*  --  8.2* 5.8 4.4  HGB 14.5 12.8* 13.0 13.7 14.1 13.4  HCT 45.9 38.2* 39.1 39.4 41.6 39.2  MCV 91.4 86.8  --  84.4 85.8 83.9  PLT 85* 114*  --  105* 103* 132*    Cardiac Enzymes: No results for input(s): CKTOTAL, CKMB, CKMBINDEX, TROPONINI in the last 168 hours. BNP (last 3 results) No results for input(s): PROBNP in the last 8760 hours. CBG: No results for input(s): GLUCAP in the last 168 hours. D-Dimer: No results for input(s): DDIMER in the last 72 hours. Hgb A1c: No results for input(s): HGBA1C in the last 72 hours. Lipid Profile: No results for input(s): CHOL, HDL, LDLCALC, TRIG, CHOLHDL, LDLDIRECT in the last 72 hours. Thyroid function studies: No results for input(s): TSH, T4TOTAL, T3FREE, THYROIDAB in the last 72 hours.  Invalid input(s): FREET3 Anemia work up: No results for input(s): VITAMINB12, FOLATE, FERRITIN, TIBC, IRON, RETICCTPCT in the last 72 hours. Sepsis Labs: Recent Labs  Lab 07/28/21 0323 07/29/21 0341 07/30/21 0341 07/31/21 0348  WBC 10.8* 10.3 7.6 6.4    Microbiology Recent Results (from the past 240 hour(s))  Resp Panel by RT-PCR (Flu A&B, Covid) Nasopharyngeal Swab     Status: None   Collection Time: 07/26/21  4:11 PM   Specimen: Nasopharyngeal Swab; Nasopharyngeal(NP) swabs in vial transport medium  Result Value Ref Range Status   SARS Coronavirus 2 by RT PCR NEGATIVE NEGATIVE Final    Comment: (NOTE) SARS-CoV-2 target nucleic acids are NOT DETECTED.  The SARS-CoV-2 RNA is generally detectable in upper respiratory specimens during the acute phase of infection. The lowest concentration of SARS-CoV-2 viral copies this assay can detect is 138 copies/mL. A negative result does not preclude SARS-Cov-2 infection and should not be used as the sole basis for treatment or other patient management decisions. A negative result may occur with  improper specimen collection/handling, submission of  specimen other than nasopharyngeal swab, presence of viral mutation(s) within the areas targeted by this assay, and inadequate number of viral copies(<138 copies/mL). A negative result must be combined with clinical observations, patient history, and epidemiological information. The expected result is Negative.  Fact Sheet for Patients:  EntrepreneurPulse.com.au  Fact Sheet for Healthcare Providers:  IncredibleEmployment.be  This test is no t yet approved or cleared by the [place] FDA and  has been authorized for detection and/or diagnosis of SARS-CoV-2 by FDA under an Emergency Use Authorization (EUA). This EUA will remain  in effect (meaning this test can be used) for the duration of the COVID-19 declaration under Section 564(b)(1) of the Act, 21 U.S.C.section 360bbb-3(b)(1), unless the  authorization is terminated  or revoked sooner.       Influenza A by PCR NEGATIVE NEGATIVE Final   Influenza B by PCR NEGATIVE NEGATIVE Final    Comment: (NOTE) The Xpert Xpress SARS-CoV-2/FLU/RSV plus assay is intended as an aid in the diagnosis of influenza from Nasopharyngeal swab specimens and should not be used as a sole basis for treatment. Nasal washings and aspirates are unacceptable for Xpert Xpress SARS-CoV-2/FLU/RSV testing.  Fact Sheet for Patients: EntrepreneurPulse.com.au  Fact Sheet for Healthcare Providers: IncredibleEmployment.be  This test is not yet approved or cleared by the [place] FDA and has been authorized for detection and/or diagnosis of SARS-CoV-2 by FDA under an Emergency Use Authorization (EUA). This EUA will remain in effect (meaning this test can be used) for the duration of the COVID-19 declaration under Section 564(b)(1) of the Act, 21 U.S.C. section 360bbb-3(b)(1), unless the authorization is terminated or revoked.  Performed at [facility], [place]  [address], [place], [place] [address]   Surgical pcr screen     Status: None   Collection Time: 07/27/21  5:00 AM   Specimen: Nasal Mucosa; Nasal Swab  Result Value Ref Range Status   MRSA, PCR NEGATIVE NEGATIVE Final   Staphylococcus aureus NEGATIVE NEGATIVE Final    Comment: (NOTE) The Xpert SA Assay (FDA approved for NASAL specimens in patients 35 years of age and older), is one component of a comprehensive surveillance program. It is not intended to diagnose infection nor to guide or monitor treatment. Performed at [facility], [place] [address], [place], [place] [address]      Medications:    docusate sodium  100 mg Oral BID   enoxaparin (LOVENOX) injection  40 mg Subcutaneous Q24H   feeding supplement (KATE FARMS STANDARD 1.4)  325 mL Oral BID BM   multivitamin with minerals  1 tablet Oral Daily   sodium chloride flush  3 mL Intravenous Q12H   Continuous Infusions:  sodium chloride 10 mL/hr at 07/27/21 1703      LOS: 5 days   Charlynne Cousins  [facility]  07/31/2021, 9:09 AM

## 2021-07-31 NOTE — TOC Progression Note (Signed)
Transition of Care [facility]) - Progression Note   Patient Details  Name: Edwin [name] MRN: [id number] Date of Birth: 03-22-1934  Transition of [facility]) CM/SW [place], LCSW Phone Number: 07/31/2021, 2:52 PM  Clinical Narrative: CSW followed up with Neoma Laming in admissions at [facility] and confirmed the patient's authorization is still pending. TOC to follow.  Expected Discharge Plan: [place] Barriers to Discharge: Continued Medical Work up, Ship broker  Expected Discharge Plan and Services Expected Discharge Plan: [place] arrangements for the past 2 months: Single Family Home  Readmission Risk Interventions No flowsheet data found.

## 2021-08-01 DIAGNOSIS — S72009A Fracture of unspecified part of neck of unspecified femur, initial encounter for closed fracture: Secondary | ICD-10-CM

## 2021-08-01 LAB — RESP PANEL BY RT-PCR (FLU A&B, COVID) ARPGX2
Influenza A by PCR: NEGATIVE
Influenza B by PCR: NEGATIVE
SARS Coronavirus 2 by RT PCR: NEGATIVE

## 2021-08-01 MED ORDER — HYDROCODONE-ACETAMINOPHEN 7.5-325 MG PO TABS: 1.0000 | ORAL_TABLET | ORAL | 0 refills | Status: DC | PRN

## 2021-08-01 NOTE — Progress Notes (Signed)
Ptar arrived to take patient to [facility], facility called multiple times at ptar's request to verify that they would accept patient at this time of night- not able to get anyone on the line, ptar transport patient on stretcher with one bag of patient belongings, room checked for additional belongings and none were found, patient with brief on from home- had been changed roughly 1.5 hours prior, avs and prescriptions sent in envelope for facility, report called on dayshift and wife aware of D/C.

## 2021-08-01 NOTE — Progress Notes (Signed)
[facility] PROGRESS NOTE    Progress Note  Edwin [name]  [id number] DOB: 1933/09/15 DOA: 07/26/2021 PCP: Pcp, No     Brief Narrative:   Edwin [name] is an 88 y.o. male with medical history of dementia comes into the ED with mechanical fall leading to closed right hip fracture  Assessment/Plan:   Closed right hip fracture (HCC) Lovenox for DVT prophylaxis while in house aspirin twice a day for 30 days upon discharge. Patient is awaiting insurance authorization.  Dementia without behavioral disturbance (Edwin [name]) Continue delirium precautions she is at high risk of aspiration.  Malnutrition of moderate degree BMI of 18 continue Ensure 3 times daily.    DVT prophylaxis: lovenoxn Family Communication:one Status is: Inpatient Remains inpatient appropriate because: Awaiting insurance authorization for skilled nursing facility placement      Code Status:     Code Status Orders  (From admission, onward)           Start     Ordered   07/26/21 1958  Full code  Continuous        07/26/21 1957           Code Status History     This patient has a current code status but no historical code status.      Advance Directive Documentation    Flowsheet Row Most Recent Value  Type of Advance Directive Healthcare Power of Attorney, Living will  Pre-existing out of facility DNR order (yellow form or pink MOST form) --  "MOST" Form in Place? --         IV Access:   Peripheral IV   Procedures and diagnostic studies:   No results found.   Medical Consultants:   None.   Subjective:    Edwin [name] has no new complaints today.  Objective:    Vitals:   07/31/21 0613 07/31/21 1411 07/31/21 2010 08/01/21 0617  BP: 102/73 (!) 121/56 91/63 108/67  Pulse: 68 96 87 71  Resp: 16 16 18 17   Temp: 97.7 F (36.5 C) 98.2 F (36.8 C) 99.9 F (37.7 C) 97.7 F (36.5 C)  TempSrc:   Oral Oral  SpO2: 97% 99% 100% 99%  Weight:      Height:        SpO2: 99 % O2 Flow Rate (L/min): 2 L/min   Intake/Output Summary (Last 24 hours) at 08/01/2021 0831 Last data filed at 07/31/2021 1824 Gross per 24 hour  Intake 580 ml  Output --  Net 580 ml    Filed Weights   07/27/21 1330  Weight: 63.7 kg    Exam: General exam: In no acute distress. Respiratory system: Good air movement and clear to auscultation. Cardiovascular system: S1 & S2 heard, RRR. No JVD. Gastrointestinal system: Abdomen is nondistended, soft and nontender.  Extremities: No pedal edema. Skin: No rashes, lesions or ulcers Psychiatry: Judgement and insight appear normal. Mood & affect appropriate.   Data Reviewed:    Labs: Basic Metabolic Panel: Recent Labs  Lab 07/27/21 0644 07/28/21 0323 07/29/21 0341 07/30/21 0341 07/31/21 0348  NA 143 141 138 136 137  K 3.7 4.2 4.4 4.2 4.2  CL 110 109 107 103 104  CO2 29 26 25 29 28   GLUCOSE 89 129* 87 98 86  BUN 13 16 20 16 17   CREATININE 1.06 1.03 0.85 0.83 0.90  CALCIUM 9.6 9.2 8.8* 9.1 8.7*  MG 2.2 2.1 2.1 2.2 2.1    GFR Estimated Creatinine Clearance: 54.1 mL/min (by C-G formula based  on SCr of 0.9 mg/dL). Liver Function Tests: No results for input(s): AST, ALT, ALKPHOS, BILITOT, PROT, ALBUMIN in the last 168 hours. No results for input(s): LIPASE, AMYLASE in the last 168 hours. No results for input(s): AMMONIA in the last 168 hours. Coagulation profile Recent Labs  Lab 07/26/21 1544  INR 1.3*    COVID-19 Labs  No results for input(s): DDIMER, FERRITIN, LDH, CRP in the last 72 hours.  Lab Results  Component Value Date   SARSCOV2NAA NEGATIVE 07/26/2021   [place] NEGATIVE 03/14/2021    CBC: Recent Labs  Lab 07/27/21 0311 07/28/21 0323 07/28/21 1531 07/29/21 0341 07/30/21 0341 07/31/21 0348  WBC 8.0 10.8*  --  10.3 7.6 6.4  NEUTROABS 6.6 9.5*  --  8.2* 5.8 4.4  HGB 14.5 12.8* 13.0 13.7 14.1 13.4  HCT 45.9 38.2* 39.1 39.4 41.6 39.2  MCV 91.4 86.8  --  84.4 85.8 83.9  PLT 85*  114*  --  105* 103* 132*    Cardiac Enzymes: No results for input(s): CKTOTAL, CKMB, CKMBINDEX, TROPONINI in the last 168 hours. BNP (last 3 results) No results for input(s): PROBNP in the last 8760 hours. CBG: No results for input(s): GLUCAP in the last 168 hours. D-Dimer: No results for input(s): DDIMER in the last 72 hours. Hgb A1c: No results for input(s): HGBA1C in the last 72 hours. Lipid Profile: No results for input(s): CHOL, HDL, LDLCALC, TRIG, CHOLHDL, LDLDIRECT in the last 72 hours. Thyroid function studies: No results for input(s): TSH, T4TOTAL, T3FREE, THYROIDAB in the last 72 hours.  Invalid input(s): FREET3 Anemia work up: No results for input(s): VITAMINB12, FOLATE, FERRITIN, TIBC, IRON, RETICCTPCT in the last 72 hours. Sepsis Labs: Recent Labs  Lab 07/28/21 0323 07/29/21 0341 07/30/21 0341 07/31/21 0348  WBC 10.8* 10.3 7.6 6.4    Microbiology Recent Results (from the past 240 hour(s))  Resp Panel by RT-PCR (Flu A&B, Covid) Nasopharyngeal Swab     Status: None   Collection Time: 07/26/21  4:11 PM   Specimen: Nasopharyngeal Swab; Nasopharyngeal(NP) swabs in vial transport medium  Result Value Ref Range Status   SARS Coronavirus 2 by RT PCR NEGATIVE NEGATIVE Final    Comment: (NOTE) SARS-CoV-2 target nucleic acids are NOT DETECTED.  The SARS-CoV-2 RNA is generally detectable in upper respiratory specimens during the acute phase of infection. The lowest concentration of SARS-CoV-2 viral copies this assay can detect is 138 copies/mL. A negative result does not preclude SARS-Cov-2 infection and should not be used as the sole basis for treatment or other patient management decisions. A negative result may occur with  improper specimen collection/handling, submission of specimen other than nasopharyngeal swab, presence of viral mutation(s) within the areas targeted by this assay, and inadequate number of viral copies(<138 copies/mL). A negative result must  be combined with clinical observations, patient history, and epidemiological information. The expected result is Negative.  Fact Sheet for Patients:  EntrepreneurPulse.com.au  Fact Sheet for Healthcare Providers:  IncredibleEmployment.be  This test is no t yet approved or cleared by the [place] FDA and  has been authorized for detection and/or diagnosis of SARS-CoV-2 by FDA under an Emergency Use Authorization (EUA). This EUA will remain  in effect (meaning this test can be used) for the duration of the COVID-19 declaration under Section 564(b)(1) of the Act, 21 U.S.C.section 360bbb-3(b)(1), unless the authorization is terminated  or revoked sooner.       Influenza A by PCR NEGATIVE NEGATIVE Final   Influenza B by PCR  NEGATIVE NEGATIVE Final    Comment: (NOTE) The Xpert Xpress SARS-CoV-2/FLU/RSV plus assay is intended as an aid in the diagnosis of influenza from Nasopharyngeal swab specimens and should not be used as a sole basis for treatment. Nasal washings and aspirates are unacceptable for Xpert Xpress SARS-CoV-2/FLU/RSV testing.  Fact Sheet for Patients: EntrepreneurPulse.com.au  Fact Sheet for Healthcare Providers: IncredibleEmployment.be  This test is not yet approved or cleared by the [place] FDA and has been authorized for detection and/or diagnosis of SARS-CoV-2 by FDA under an Emergency Use Authorization (EUA). This EUA will remain in effect (meaning this test can be used) for the duration of the COVID-19 declaration under Section 564(b)(1) of the Act, 21 U.S.C. section 360bbb-3(b)(1), unless the authorization is terminated or revoked.  Performed at [facility], [place] [address] [place], [place] [address]   Surgical pcr screen     Status: None   Collection Time: 07/27/21  5:00 AM   Specimen: Nasal Mucosa; Nasal Swab  Result Value Ref Range Status   MRSA,  PCR NEGATIVE NEGATIVE Final   Staphylococcus aureus NEGATIVE NEGATIVE Final    Comment: (NOTE) The Xpert SA Assay (FDA approved for NASAL specimens in patients 94 years of age and older), is one component of a comprehensive surveillance program. It is not intended to diagnose infection nor to guide or monitor treatment. Performed at [facility], [place] [address] [place], [place] [address]      Medications:    docusate sodium  100 mg Oral BID   enoxaparin (LOVENOX) injection  40 mg Subcutaneous Q24H   feeding supplement (K[employer] STANDARD 1.4)  325 mL Oral BID BM   multivitamin with minerals  1 tablet Oral Daily   sodium chloride flush  3 mL Intravenous Q12H   Continuous Infusions:  sodium chloride 10 mL/hr at 07/27/21 1703      LOS: 6 days   Charlynne Cousins  [facility]  08/01/2021, 8:31 AM

## 2021-08-01 NOTE — Discharge Summary (Signed)
Physician Discharge Summary  Edwin [name] [id number] DOB: May 16, 1934 DOA: 07/26/2021  PCP: Merryl Hacker, No  Admit date: 07/26/2021 Discharge date: 08/01/2021  Admitted From: Home Disposition:  SNF  Recommendations for Outpatient Follow-up:  Follow up with PCP in 1-2 weeks    Home Health:no Equipment/Devices:None  Discharge Condition:Stable CODE STATUS:Full Diet recommendation: Heart Healthy  Brief/Interim Summary: 88 y.o. male with medical history of dementia comes into the ED with mechanical fall leading to closed right hip fracture  Discharge Diagnoses:  Principal Problem:   Closed right hip fracture ([place]) Active Problems:   Dementia without behavioral disturbance (H[facility])   Malnutrition of moderate degree  Closed right hip fracture secondary to mechanical fall: Status post right hemiarthroplasty on 07/27/2021. Orthopedic surgery recommended weightbearing as tolerated physical therapy evaluated the patient and recommended home health PT. Orthopedic surgery recommended aspirin twice a day for DVT prophylaxis.  Without behavioral disturbances: No events continue current medication.  Severe protein caloric malnutrition: With a BMI less than 18, continue Ensure 3 times daily.  Discharge Instructions  Discharge Instructions     Diet - low sodium heart healthy   Complete by: As directed    Increase activity slowly   Complete by: As directed    No wound care   Complete by: As directed       Allergies as of 08/01/2021       Reactions   Beef-derived Products Other (See Comments)   Requested by wife.   Fish-derived Products Other (See Comments)   Requested by wife.        Medication List     TAKE these medications    acetaminophen 500 MG tablet Commonly known as: TYLENOL Take 1 tablet (500 mg total) by mouth every 6 (six) hours as needed for mild pain or moderate pain.   aspirin EC 81 MG tablet Take 1 tablet (81 mg total) by mouth 2 (two) times daily. For  DVT prophylaxis for 30 days after surgery.   HYDROcodone-acetaminophen 7.5-325 MG tablet Commonly known as: NORCO Take 1-2 tablets by mouth every 4 (four) hours as needed for severe pain (pain score 7-10).        Contact information for after-discharge care     Destination     [facility] .   Service: Skilled Nursing Contact information: [address] [place] [phone]                    Allergies  Allergen Reactions   Beef-Derived Products Other (See Comments)    Requested by wife.   Fish-Derived Products Other (See Comments)    Requested by wife.    Consultations: Orthopedic surgery   Procedures/Studies: Pelvis Portable  Result Date: 07/27/2021 CLINICAL DATA:  Right hip replacement EXAM: PORTABLE PELVIS 1-2 VIEWS COMPARISON:  07/26/2021 FINDINGS: Frontal pelvis shows interval right hip hemiarthroplasty. Bones are diffusely demineralized. Degenerative changes noted lower lumbar spine. IMPRESSION: Interval right hip hemiarthroplasty. Electronically Signed   By: Misty Stanley M.D.   On: 07/27/2021 12:51   DG Chest Port 1 View  Result Date: 07/26/2021 CLINICAL DATA:  Preoperative chest radiograph prior to RIGHT hip surgery. EXAM: PORTABLE CHEST 1 VIEW COMPARISON:  None FINDINGS: Cardiomediastinal silhouette is within normal limits. There is no evidence of focal airspace disease, pulmonary edema, suspicious pulmonary nodule/mass, pleural effusion, or pneumothorax. No acute bony abnormalities are identified. Degenerative changes in the shoulders are noted. IMPRESSION: No active disease. Electronically Signed   By: Dellis Filbert  Hu M.D.   On: 07/26/2021 15:51   DG Hip Port Unilat With Pelvis 1V Right  Result Date: 07/27/2021 CLINICAL DATA:  Postop right hip replacement. EXAM: DG HIP (WITH OR WITHOUT PELVIS) 1V PORT RIGHT COMPARISON:  07/26/2021 FINDINGS: Single view of the right hip shows interval  hemiarthroplasty. No evidence for immediate hardware complication. IMPRESSION: Interval right hip replacement without complicating features. Electronically Signed   By: Misty Stanley M.D.   On: 07/27/2021 12:50   DG Hip Unilat W or Wo Pelvis 2-3 Views Right  Result Date: 07/26/2021 CLINICAL DATA:  Trauma, fall EXAM: DG HIP (WITH OR WITHOUT PELVIS) 2-3V RIGHT COMPARISON:  None. FINDINGS: There is impacted fracture in the subcapital portion of neck of right femur. There is no dislocation. Degenerative changes with small bony spurs are noted in right hip. IMPRESSION: Recent impacted fracture is seen in the subcapital portion of neck of right femur. There is no dislocation. Degenerative changes with small bony spurs seen in the right hip. Electronically Signed   By: Elmer Picker M.D.   On: 07/26/2021 15:19   (Echo, Carotid, EGD, Colonoscopy, ERCP)    Subjective: Patient has no new complaints.  Discharge Exam: Vitals:   07/31/21 2010 08/01/21 0617  BP: 91/63 108/67  Pulse: 87 71  Resp: 18 17  Temp: 99.9 F (37.7 C) 97.7 F (36.5 C)  SpO2: 100% 99%   Vitals:   07/31/21 0613 07/31/21 1411 07/31/21 2010 08/01/21 0617  BP: 102/73 (!) 121/56 91/63 108/67  Pulse: 68 96 87 71  Resp: 16 16 18 17   Temp: 97.7 F (36.5 C) 98.2 F (36.8 C) 99.9 F (37.7 C) 97.7 F (36.5 C)  TempSrc:   Oral Oral  SpO2: 97% 99% 100% 99%  Weight:      Height:        General: Pt is alert, awake, not in acute distress Cardiovascular: RRR, S1/S2 +, no rubs, no gallops Respiratory: CTA bilaterally, no wheezing, no rhonchi Abdominal: Soft, NT, ND, bowel sounds + Extremities: no edema, no cyanosis    The results of significant diagnostics from this hospitalization (including imaging, microbiology, ancillary and laboratory) are listed below for reference.     Microbiology: Recent Results (from the past 240 hour(s))  Resp Panel by RT-PCR (Flu A&B, Covid) Nasopharyngeal Swab     Status: None    Collection Time: 07/26/21  4:11 PM   Specimen: Nasopharyngeal Swab; Nasopharyngeal(NP) swabs in vial transport medium  Result Value Ref Range Status   SARS Coronavirus 2 by RT PCR NEGATIVE NEGATIVE Final    Comment: (NOTE) SARS-CoV-2 target nucleic acids are NOT DETECTED.  The SARS-CoV-2 RNA is generally detectable in upper respiratory specimens during the acute phase of infection. The lowest concentration of SARS-CoV-2 viral copies this assay can detect is 138 copies/mL. A negative result does not preclude SARS-Cov-2 infection and should not be used as the sole basis for treatment or other patient management decisions. A negative result may occur with  improper specimen collection/handling, submission of specimen other than nasopharyngeal swab, presence of viral mutation(s) within the areas targeted by this assay, and inadequate number of viral copies(<138 copies/mL). A negative result must be combined with clinical observations, patient history, and epidemiological information. The expected result is Negative.  Fact Sheet for Patients:  EntrepreneurPulse.com.au  Fact Sheet for Healthcare Providers:  IncredibleEmployment.be  This test is no t yet approved or cleared by the [place] FDA and  has been authorized for detection and/or diagnosis of SARS-CoV-2  by FDA under an Emergency Use Authorization (EUA). This EUA will remain  in effect (meaning this test can be used) for the duration of the COVID-19 declaration under Section 564(b)(1) of the Act, 21 U.S.C.section 360bbb-3(b)(1), unless the authorization is terminated  or revoked sooner.       Influenza A by PCR NEGATIVE NEGATIVE Final   Influenza B by PCR NEGATIVE NEGATIVE Final    Comment: (NOTE) The Xpert Xpress SARS-CoV-2/FLU/RSV plus assay is intended as an aid in the diagnosis of influenza from Nasopharyngeal swab specimens and should not be used as a sole basis for treatment.  Nasal washings and aspirates are unacceptable for Xpert Xpress SARS-CoV-2/FLU/RSV testing.  Fact Sheet for Patients: EntrepreneurPulse.com.au  Fact Sheet for Healthcare Providers: IncredibleEmployment.be  This test is not yet approved or cleared by the [place] FDA and has been authorized for detection and/or diagnosis of SARS-CoV-2 by FDA under an Emergency Use Authorization (EUA). This EUA will remain in effect (meaning this test can be used) for the duration of the COVID-19 declaration under Section 564(b)(1) of the Act, 21 U.S.C. section 360bbb-3(b)(1), unless the authorization is terminated or revoked.  Performed at [facility], [place] [address] [place], [place] [address]   Surgical pcr screen     Status: None   Collection Time: 07/27/21  5:00 AM   Specimen: Nasal Mucosa; Nasal Swab  Result Value Ref Range Status   MRSA, PCR NEGATIVE NEGATIVE Final   Staphylococcus aureus NEGATIVE NEGATIVE Final    Comment: (NOTE) The Xpert SA Assay (FDA approved for NASAL specimens in patients 59 years of age and older), is one component of a comprehensive surveillance program. It is not intended to diagnose infection nor to guide or monitor treatment. Performed at [facility], [place] [address], [place], [place] [address]   Resp Panel by RT-PCR (Flu A&B, Covid) Nasopharyngeal Swab     Status: None   Collection Time: 08/01/21 12:57 PM   Specimen: Nasopharyngeal Swab; Nasopharyngeal(NP) swabs in vial transport medium  Result Value Ref Range Status   SARS Coronavirus 2 by RT PCR NEGATIVE NEGATIVE Final    Comment: (NOTE) SARS-CoV-2 target nucleic acids are NOT DETECTED.  The SARS-CoV-2 RNA is generally detectable in upper respiratory specimens during the acute phase of infection. The lowest concentration of SARS-CoV-2 viral copies this assay can detect is 138 copies/mL. A negative result does not preclude  SARS-Cov-2 infection and should not be used as the sole basis for treatment or other patient management decisions. A negative result may occur with  improper specimen collection/handling, submission of specimen other than nasopharyngeal swab, presence of viral mutation(s) within the areas targeted by this assay, and inadequate number of viral copies(<138 copies/mL). A negative result must be combined with clinical observations, patient history, and epidemiological information. The expected result is Negative.  Fact Sheet for Patients:  EntrepreneurPulse.com.au  Fact Sheet for Healthcare Providers:  IncredibleEmployment.be  This test is no t yet approved or cleared by the [place] FDA and  has been authorized for detection and/or diagnosis of SARS-CoV-2 by FDA under an Emergency Use Authorization (EUA). This EUA will remain  in effect (meaning this test can be used) for the duration of the COVID-19 declaration under Section 564(b)(1) of the Act, 21 U.S.C.section 360bbb-3(b)(1), unless the authorization is terminated  or revoked sooner.       Influenza A by PCR NEGATIVE NEGATIVE Final   Influenza B by PCR NEGATIVE NEGATIVE Final    Comment: (  NOTE) The Xpert Xpress SARS-CoV-2/FLU/RSV plus assay is intended as an aid in the diagnosis of influenza from Nasopharyngeal swab specimens and should not be used as a sole basis for treatment. Nasal washings and aspirates are unacceptable for Xpert Xpress SARS-CoV-2/FLU/RSV testing.  Fact Sheet for Patients: EntrepreneurPulse.com.au  Fact Sheet for Healthcare Providers: IncredibleEmployment.be  This test is not yet approved or cleared by the [place] FDA and has been authorized for detection and/or diagnosis of SARS-CoV-2 by FDA under an Emergency Use Authorization (EUA). This EUA will remain in effect (meaning this test can be used) for the duration of  the COVID-19 declaration under Section 564(b)(1) of the Act, 21 U.S.C. section 360bbb-3(b)(1), unless the authorization is terminated or revoked.  Performed at [facility], [place] [address] [place], [place] [address]      Labs: BNP (last 3 results) No results for input(s): BNP in the last 8760 hours. Basic Metabolic Panel: Recent Labs  Lab 07/27/21 0644 07/28/21 0323 07/29/21 0341 07/30/21 0341 07/31/21 0348  NA 143 141 138 136 137  K 3.7 4.2 4.4 4.2 4.2  CL 110 109 107 103 104  CO2 29 26 25 29 28   GLUCOSE 89 129* 87 98 86  BUN 13 16 20 16 17   CREATININE 1.06 1.03 0.85 0.83 0.90  CALCIUM 9.6 9.2 8.8* 9.1 8.7*  MG 2.2 2.1 2.1 2.2 2.1   Liver Function Tests: No results for input(s): AST, ALT, ALKPHOS, BILITOT, PROT, ALBUMIN in the last 168 hours. No results for input(s): LIPASE, AMYLASE in the last 168 hours. No results for input(s): AMMONIA in the last 168 hours. CBC: Recent Labs  Lab 07/27/21 0311 07/28/21 0323 07/28/21 1531 07/29/21 0341 07/30/21 0341 07/31/21 0348  WBC 8.0 10.8*  --  10.3 7.6 6.4  NEUTROABS 6.6 9.5*  --  8.2* 5.8 4.4  HGB 14.5 12.8* 13.0 13.7 14.1 13.4  HCT 45.9 38.2* 39.1 39.4 41.6 39.2  MCV 91.4 86.8  --  84.4 85.8 83.9  PLT 85* 114*  --  105* 103* 132*   Cardiac Enzymes: No results for input(s): CKTOTAL, CKMB, CKMBINDEX, TROPONINI in the last 168 hours. BNP: Invalid input(s): POCBNP CBG: No results for input(s): GLUCAP in the last 168 hours. D-Dimer No results for input(s): DDIMER in the last 72 hours. Hgb A1c No results for input(s): HGBA1C in the last 72 hours. Lipid Profile No results for input(s): CHOL, HDL, LDLCALC, TRIG, CHOLHDL, LDLDIRECT in the last 72 hours. Thyroid function studies No results for input(s): TSH, T4TOTAL, T3FREE, THYROIDAB in the last 72 hours.  Invalid input(s): FREET3 Anemia work up No results for input(s): VITAMINB12, FOLATE, FERRITIN, TIBC, IRON, RETICCTPCT in the last 72  hours. Urinalysis    Component Value Date/Time   COLORURINE YELLOW 03/14/2021 1033   APPEARANCEUR CLEAR 03/14/2021 1033   LABSPEC 1.006 03/14/2021 1033   PHURINE 8.0 03/14/2021 1033   GLUCOSEU NEGATIVE 03/14/2021 1033   HGBUR SMALL (A) 03/14/2021 1033   BILIRUBINUR NEGATIVE 03/14/2021 1033   KETONESUR NEGATIVE 03/14/2021 1033   PROTEINUR NEGATIVE 03/14/2021 1033   NITRITE NEGATIVE 03/14/2021 1033   LEUKOCYTESUR NEGATIVE 03/14/2021 1033   Sepsis Labs Invalid input(s): PROCALCITONIN,  WBC,  LACTICIDVEN Microbiology Recent Results (from the past 240 hour(s))  Resp Panel by RT-PCR (Flu A&B, Covid) Nasopharyngeal Swab     Status: None   Collection Time: 07/26/21  4:11 PM   Specimen: Nasopharyngeal Swab; Nasopharyngeal(NP) swabs in vial transport medium  Result Value Ref Range Status  SARS Coronavirus 2 by RT PCR NEGATIVE NEGATIVE Final    Comment: (NOTE) SARS-CoV-2 target nucleic acids are NOT DETECTED.  The SARS-CoV-2 RNA is generally detectable in upper respiratory specimens during the acute phase of infection. The lowest concentration of SARS-CoV-2 viral copies this assay can detect is 138 copies/mL. A negative result does not preclude SARS-Cov-2 infection and should not be used as the sole basis for treatment or other patient management decisions. A negative result may occur with  improper specimen collection/handling, submission of specimen other than nasopharyngeal swab, presence of viral mutation(s) within the areas targeted by this assay, and inadequate number of viral copies(<138 copies/mL). A negative result must be combined with clinical observations, patient history, and epidemiological information. The expected result is Negative.  Fact Sheet for Patients:  EntrepreneurPulse.com.au  Fact Sheet for Healthcare Providers:  IncredibleEmployment.be  This test is no t yet approved or cleared by the [place] FDA and  has been  authorized for detection and/or diagnosis of SARS-CoV-2 by FDA under an Emergency Use Authorization (EUA). This EUA will remain  in effect (meaning this test can be used) for the duration of the COVID-19 declaration under Section 564(b)(1) of the Act, 21 U.S.C.section 360bbb-3(b)(1), unless the authorization is terminated  or revoked sooner.       Influenza A by PCR NEGATIVE NEGATIVE Final   Influenza B by PCR NEGATIVE NEGATIVE Final    Comment: (NOTE) The Xpert Xpress SARS-CoV-2/FLU/RSV plus assay is intended as an aid in the diagnosis of influenza from Nasopharyngeal swab specimens and should not be used as a sole basis for treatment. Nasal washings and aspirates are unacceptable for Xpert Xpress SARS-CoV-2/FLU/RSV testing.  Fact Sheet for Patients: EntrepreneurPulse.com.au  Fact Sheet for Healthcare Providers: IncredibleEmployment.be  This test is not yet approved or cleared by the [place] FDA and has been authorized for detection and/or diagnosis of SARS-CoV-2 by FDA under an Emergency Use Authorization (EUA). This EUA will remain in effect (meaning this test can be used) for the duration of the COVID-19 declaration under Section 564(b)(1) of the Act, 21 U.S.C. section 360bbb-3(b)(1), unless the authorization is terminated or revoked.  Performed at [facility], [place] [address], [place], [place] [address]   Surgical pcr screen     Status: None   Collection Time: 07/27/21  5:00 AM   Specimen: Nasal Mucosa; Nasal Swab  Result Value Ref Range Status   MRSA, PCR NEGATIVE NEGATIVE Final   Staphylococcus aureus NEGATIVE NEGATIVE Final    Comment: (NOTE) The Xpert SA Assay (FDA approved for NASAL specimens in patients 38 years of age and older), is one component of a comprehensive surveillance program. It is not intended to diagnose infection nor to guide or monitor treatment. Performed at [facility], [place] [address], [place], [place] [address]   Resp Panel by RT-PCR (Flu A&B, Covid) Nasopharyngeal Swab     Status: None   Collection Time: 08/01/21 12:57 PM   Specimen: Nasopharyngeal Swab; Nasopharyngeal(NP) swabs in vial transport medium  Result Value Ref Range Status   SARS Coronavirus 2 by RT PCR NEGATIVE NEGATIVE Final    Comment: (NOTE) SARS-CoV-2 target nucleic acids are NOT DETECTED.  The SARS-CoV-2 RNA is generally detectable in upper respiratory specimens during the acute phase of infection. The lowest concentration of SARS-CoV-2 viral copies this assay can detect is 138 copies/mL. A negative result does not preclude SARS-Cov-2 infection and should not be used as the sole basis for treatment or  other patient management decisions. A negative result may occur with  improper specimen collection/handling, submission of specimen other than nasopharyngeal swab, presence of viral mutation(s) within the areas targeted by this assay, and inadequate number of viral copies(<138 copies/mL). A negative result must be combined with clinical observations, patient history, and epidemiological information. The expected result is Negative.  Fact Sheet for Patients:  EntrepreneurPulse.com.au  Fact Sheet for Healthcare Providers:  IncredibleEmployment.be  This test is no t yet approved or cleared by the [place] FDA and  has been authorized for detection and/or diagnosis of SARS-CoV-2 by FDA under an Emergency Use Authorization (EUA). This EUA will remain  in effect (meaning this test can be used) for the duration of the COVID-19 declaration under Section 564(b)(1) of the Act, 21 U.S.C.section 360bbb-3(b)(1), unless the authorization is terminated  or revoked sooner.       Influenza A by PCR NEGATIVE NEGATIVE Final   Influenza B by PCR NEGATIVE NEGATIVE Final    Comment: (NOTE) The Xpert Xpress SARS-CoV-2/FLU/RSV plus assay is  intended as an aid in the diagnosis of influenza from Nasopharyngeal swab specimens and should not be used as a sole basis for treatment. Nasal washings and aspirates are unacceptable for Xpert Xpress SARS-CoV-2/FLU/RSV testing.  Fact Sheet for Patients: EntrepreneurPulse.com.au  Fact Sheet for Healthcare Providers: IncredibleEmployment.be  This test is not yet approved or cleared by the [place] FDA and has been authorized for detection and/or diagnosis of SARS-CoV-2 by FDA under an Emergency Use Authorization (EUA). This EUA will remain in effect (meaning this test can be used) for the duration of the COVID-19 declaration under Section 564(b)(1) of the Act, 21 U.S.C. section 360bbb-3(b)(1), unless the authorization is terminated or revoked.  Performed at [facility], [place] [address] [place], [place] [address]     SIGNED:   Charlynne Cousins, MD  [facility] 08/01/2021, 1:59 PM Pager   If 7PM-7AM, please contact night-coverage [email or web address] Password [email or web address]

## 2021-08-01 NOTE — Progress Notes (Signed)
Physical Therapy Treatment Patient Details Name: Edwin [name] MRN: [id number] DOB: January 27, 1934 Today's Date: 08/01/2021   History of Present Illness 88 yo male with who presented to the ER after a mechanical fall.   X-rays were obtained which showed impacted fracture involving subcapital portion of the neck of the right femur.  Orthopedic surgery was consulted and wife was amenable for surgical repair. pt is s/p R posterior hemiarthroplasty on 07/27/21 per Dr Percell Miller.  PMH dementia    PT Comments    Pt continues very pleasant and verbally agreeable to most tasks but requiring increased time as well as verbal and tactile cues for limited follow through.  This am, pt performed limited therex program and stood at side of bed x2 but unable to initiate step and repeatedly attempting to sit.  Pt demonstrates physical strength to progress well with PT but more limited by dementia related cognitive deficits.  Recommendations for follow up therapy are one component of a multi-disciplinary discharge planning process, led by the attending physician.  Recommendations may be updated based on patient status, additional functional criteria and insurance authorization.  Follow Up Recommendations  Skilled nursing-short term rehab (<3 hours/day)     Assistance Recommended at Discharge Frequent or constant Supervision/Assistance  Patient can return home with the following Assistance with cooking/housework;Direct supervision/assist for financial management;Assist for transportation;Help with stairs or ramp for entrance;Direct supervision/assist for medications management;A lot of help with bathing/dressing/bathroom;Two people to help with walking and/or transfers   Equipment Recommendations  Rolling walker (2 wheels)    Recommendations for Other Services       Precautions / Restrictions Precautions Precautions: Fall;Posterior Hip Precaution Comments: Pt has no recall of THP and oriented to self  only Restrictions Weight Bearing Restrictions: No RLE Weight Bearing: Weight bearing as tolerated     Mobility  Bed Mobility Overal bed mobility: Needs Assistance Bed Mobility: Supine to Sit, Sit to Supine     Supine to sit: Max assist, +2 for physical assistance, +2 for safety/equipment Sit to supine: Max assist, +2 for physical assistance, +2 for safety/equipment   General bed mobility comments: pt able to assist with anterior scooting intermittently, bed pad used to pivot to/from EOB, assist with RLE.    Transfers Overall transfer level: Needs assistance Equipment used: Rolling walker (2 wheels) Transfers: Sit to/from Stand Sit to Stand: Mod assist, +2 physical assistance, +2 safety/equipment, From elevated surface           General transfer comment: x2 from EOB with verbal and tactile cues and assist to bring wt up and fwd and to balance in standing with Rw    Ambulation/Gait               General Gait Details: Pt stood x 2 at EOB and agreeable to ambulate but unable to initiate step with either foot and repeatedly attempting to sit.   Stairs             Wheelchair Mobility    Modified Rankin (Stroke Patients Only)       Balance Overall balance assessment: Needs assistance Sitting-balance support: Feet supported Sitting balance-Leahy Scale: Fair     Standing balance support: Bilateral upper extremity supported, Single extremity supported, During functional activity Standing balance-Leahy Scale: Poor                              Cognition Arousal/Alertness: Awake/alert Behavior During Therapy: Flat affect Overall Cognitive Status: History of  cognitive impairments - at baseline Area of Impairment: Following commands, Safety/judgement, Problem solving, Memory                     Memory: Decreased recall of precautions Following Commands: Follows one step commands inconsistently Safety/Judgement: Decreased awareness of  deficits   Problem Solving: Slow processing, Requires verbal cues, Requires tactile cues, Difficulty sequencing General Comments: pt is oriented to self only, requires hand over hand assist to follow one step commands. Unable to recall precautions or why he is in hospital        Exercises Total Joint Exercises Ankle Circles/Pumps: Both, 15 reps, Seated, AAROM Quad Sets: AAROM, Right, 10 reps, Supine Heel Slides: AAROM, Right, 15 reps, Supine Hip ABduction/ADduction: AROM, Right, 15 reps, Supine    General Comments        Pertinent Vitals/Pain Pain Assessment Pain Assessment: PAINAD Faces Pain Scale: Hurts a little bit Pain Intervention(s): Limited activity within patient's tolerance, Monitored during session    Home Living                          Prior Function            PT Goals (current goals can now be found in the care plan section) Acute Rehab PT Goals PT Goal Formulation: With family Time For Goal Achievement: 08/11/21 Potential to Achieve Goals: Fair Progress towards PT goals: Not progressing toward goals - comment (Largely limited by cognitive deficits)    Frequency    Min 3X/week      PT Plan Current plan remains appropriate    Co-evaluation              AM-PAC PT "6 Clicks" Mobility   Outcome Measure  Help needed turning from your back to your side while in a flat bed without using bedrails?: Total Help needed moving from lying on your back to sitting on the side of a flat bed without using bedrails?: Total Help needed moving to and from a bed to a chair (including a wheelchair)?: Total Help needed standing up from a chair using your arms (e.g., wheelchair or bedside chair)?: A Lot Help needed to walk in hospital room?: Total Help needed climbing 3-5 steps with a railing? : Total 6 Click Score: 7    End of Session Equipment Utilized During Treatment: Gait belt Activity Tolerance: Patient tolerated treatment well Patient left:  in bed;with call bell/phone within reach;with bed alarm set Nurse Communication: Mobility status PT Visit Diagnosis: Other abnormalities of gait and mobility (R26.89);History of falling (Z91.81);Difficulty in walking, not elsewhere classified (R26.2)     Time: [id number] PT Time Calculation (min) (ACUTE ONLY): 38 min  Charges:  $Therapeutic Exercise: 8-22 mins $Therapeutic Activity: 23-37 mins                     [place] Pager [phone] Office [phone]    Adebayo Ensminger 08/01/2021, 10:52 AM

## 2021-08-01 NOTE — Plan of Care (Signed)
Report called to Engineer, maintenance at [facility].

## 2021-08-01 NOTE — TOC Transition Note (Signed)
Transition of Care [facility]) - CM/SW Discharge Note  Patient Details  Name: Edwin [name] MRN: [id number] Date of Birth: [birth year]  [facility] CM/SW Contact:  Ewing Schlein, LCSW Phone Number: 08/01/2021, 1:45 PM  Clinical Narrative: Patient has received insurance authorization for SNF. Patient will go to room 111P and the number for report is [phone]. Discharge summary, discharge orders, and SNF transfer report faxed to facility in hub. COVID test is negative.  Medical necessity form done; PTAR scheduled. Discharge packet completed. CSW called wife to notify her of discharge. RN updated. TOC signing off.  Final next level of care: Skilled Nursing Facility Barriers to Discharge: Barriers Resolved  Patient Goals and CMS Choice Patient states their goals for this hospitalization and ongoing recovery are:: To go to SNF for short term rehab, then return back home. CMS Medicare.gov Compare Post Acute Care list provided to:: Patient Represenative (must comment) Choice offered to / list presented to : Spouse  Discharge Placement PASRR number recieved: 07/28/21     Patient chooses bed at: [facility] Patient to be transferred to facility by: PTAR Name of family member notified: Edwin [name] (wife) Patient and family notified of of transfer: 08/01/21  Discharge Plan and Services         DME Arranged: N/A DME Agency: NA  Readmission Risk Interventions No flowsheet data found.

## 2021-08-05 ENCOUNTER — Other Ambulatory Visit: Payer: Self-pay

## 2021-08-05 ENCOUNTER — Emergency Department
Admission: EM | Admit: 2021-08-05 | Discharge: 2021-08-14 | Disposition: A | Discharge status: Home / Self Care | Payer: Medicare HMO | Attending: Emergency Medicine | Admitting: Emergency Medicine

## 2021-08-05 ENCOUNTER — Encounter: Payer: Self-pay | Admitting: *Deleted

## 2021-08-05 DIAGNOSIS — R4182 Altered mental status, unspecified: Secondary | ICD-10-CM | POA: Diagnosis not present

## 2021-08-05 DIAGNOSIS — Z131 Encounter for screening for diabetes mellitus: Secondary | ICD-10-CM | POA: Insufficient documentation

## 2021-08-05 DIAGNOSIS — S7291XD Unspecified fracture of right femur, subsequent encounter for closed fracture with routine healing: Secondary | ICD-10-CM | POA: Insufficient documentation

## 2021-08-05 DIAGNOSIS — F0390 Unspecified dementia without behavioral disturbance: Secondary | ICD-10-CM | POA: Diagnosis not present

## 2021-08-05 DIAGNOSIS — X58XXXD Exposure to other specified factors, subsequent encounter: Secondary | ICD-10-CM | POA: Insufficient documentation

## 2021-08-05 DIAGNOSIS — Z20822 Contact with and (suspected) exposure to covid-19: Secondary | ICD-10-CM | POA: Diagnosis not present

## 2021-08-05 DIAGNOSIS — S72001D Fracture of unspecified part of neck of right femur, subsequent encounter for closed fracture with routine healing: Secondary | ICD-10-CM

## 2021-08-05 LAB — COMPREHENSIVE METABOLIC PANEL
ALT: 38 U/L (ref 0–44)
AST: 39 U/L (ref 15–41)
Albumin: 3.2 g/dL — ABNORMAL LOW (ref 3.5–5.0)
Alkaline Phosphatase: 53 U/L (ref 38–126)
Anion gap: 10 (ref 5–15)
BUN: 19 mg/dL (ref 8–23)
CO2: 30 mmol/L (ref 22–32)
Calcium: 10 mg/dL (ref 8.9–10.3)
Chloride: 101 mmol/L (ref 98–111)
Creatinine, Ser: 1.06 mg/dL (ref 0.61–1.24)
GFR, Estimated: >60 mL/min (ref >60)
Glucose, Bld: 136 mg/dL — ABNORMAL HIGH (ref 70–99)
Potassium: 4.3 mmol/L (ref 3.5–5.1)
Sodium: 141 mmol/L (ref 135–145)
Total Bilirubin: 0.8 mg/dL (ref 0.3–1.2)
Total Protein: 6.9 g/dL (ref 6.5–8.1)

## 2021-08-05 LAB — URINALYSIS, COMPLETE (UACMP) WITH MICROSCOPIC
Bacteria, UA: NONE SEEN
Bilirubin Urine: NEGATIVE
Glucose, UA: NEGATIVE mg/dL
Hgb urine dipstick: NEGATIVE
Ketones, ur: NEGATIVE mg/dL
Leukocytes,Ua: NEGATIVE
Nitrite: NEGATIVE
Protein, ur: NEGATIVE mg/dL
Specific Gravity, Urine: 1.013 (ref 1.005–1.030)
Squamous Epithelial / HPF: NONE SEEN (ref 0–5)
pH: 6 (ref 5.0–8.0)

## 2021-08-05 LAB — CBC
HCT: 41.4 % (ref 39.0–52.0)
Hemoglobin: 13.5 g/dL (ref 13.0–17.0)
MCH: 28.1 pg (ref 26.0–34.0)
MCHC: 32.6 g/dL (ref 30.0–36.0)
MCV: 86.3 fL (ref 80.0–100.0)
Platelets: 389 10*3/uL (ref 150–400)
RBC: 4.8 MIL/uL (ref 4.22–5.81)
RDW: 13.7 % (ref 11.5–15.5)
WBC: 7.8 10*3/uL (ref 4.0–10.5)
nRBC: 0 % (ref 0.0–0.2)

## 2021-08-05 LAB — RESP PANEL BY RT-PCR (FLU A&B, COVID) ARPGX2
Influenza A by PCR: NEGATIVE
Influenza B by PCR: NEGATIVE
SARS Coronavirus 2 by RT PCR: NEGATIVE

## 2021-08-05 MED ORDER — HYDROCODONE-ACETAMINOPHEN 7.5-325 MG PO TABS
1.0000 | ORAL_TABLET | ORAL | Status: DC | PRN
  Administered 2021-08-10: 2 via ORAL
  Filled 2021-08-05: qty 2

## 2021-08-05 MED ORDER — ASPIRIN EC 81 MG PO TBEC
81.0000 mg | DELAYED_RELEASE_TABLET | Freq: Two times a day (BID) | ORAL | Status: DC
  Administered 2021-08-06 – 2021-08-11 (×6): 81 mg via ORAL
  Filled 2021-08-05 (×8): qty 1

## 2021-08-05 MED ORDER — ACETAMINOPHEN 500 MG PO TABS: 500.0000 mg | ORAL_TABLET | Freq: Four times a day (QID) | ORAL | Status: DC | PRN

## 2021-08-05 NOTE — ED Triage Notes (Addendum)
Pt brought in via ems from [facility].  Pt sent to er for eval of dementia.  Pt aggressive and uncooperative at nursing home.  Pt unable to follow some commands in triage.  Pt had hip surgery 2/19.   Pt alert.

## 2021-08-05 NOTE — ED Provider Notes (Signed)
[facility] Provider Note    Event Date/Time   First MD Initiated Contact with Patient 08/05/21 1902     (approximate)   History   Altered Mental Status   HPI  Edwin [name] is a 88 y.o. male  who, per discharge summary dated from 4 days ago had admission for right hip fracture, who also has history of dementia, who presents from [facility] because of his dementia. Unfortunately the patient cannot give any significant history.       Physical Exam   Triage Vital Signs: ED Triage Vitals  Enc Vitals Group     BP 08/05/21 1753 (!) 135/102     Pulse Rate 08/05/21 1753 (!) 115     Resp 08/05/21 1753 20     Temp 08/05/21 1753 (!) 97.5 F (36.4 C)     Temp Source 08/05/21 1753 Axillary     SpO2 08/05/21 1753 96 %     Weight 08/05/21 1751 140 lb (63.5 kg)     Height 08/05/21 1751 5\' 11"  (1.803 m)     Head Circumference --      Peak Flow --      Pain Score 08/05/21 1751 0     Pain Loc --      Pain Edu? --      Excl. in GC? --     Most recent vital signs: Vitals:   08/05/21 1753  BP: (!) 135/102  Pulse: (!) 115  Resp: 20  Temp: (!) 97.5 F (36.4 C)  SpO2: 96%   General: Awake, no distress. Dementia. CV:  Good peripheral perfusion.  Resp:  Normal effort.  Abd:  No distention.  Neuro:  Awake and alert. Not oriented.   ED Results / Procedures / Treatments   Labs (all labs ordered are listed, but only abnormal results are displayed) Labs Reviewed  COMPREHENSIVE METABOLIC PANEL - Abnormal; Notable for the following components:      Result Value   Glucose, Bld 136 (*)    Albumin 3.2 (*)    All other components within normal limits  URINALYSIS, COMPLETE (UACMP) WITH MICROSCOPIC - Abnormal; Notable for the following components:   Color, Urine AMBER (*)    APPearance CLOUDY (*)    All other components within normal limits  RESP PANEL BY RT-PCR (FLU A&B, COVID) ARPGX2  CBC      EKG  None   RADIOLOGY None   PROCEDURES:  Critical Care performed: No  Procedures   MEDICATIONS ORDERED IN ED: Medications - No data to display   IMPRESSION / MDM / ASSESSMENT AND PLAN / ED COURSE  I reviewed the triage vital signs and the nursing notes.                              Differential diagnosis includes, but is not limited to, infection, worsening dementia.   Patient presents to the emergency department today from [facility] because of concerns of his dementia.  Apparently they are unable to care for the patient given his current dementia.  On exam here patient is calm.  Urine without any signs of urinary tract infection.  Will have social work evaluate for possible placement.    FINAL CLINICAL IMPRESSION(S) / ED DIAGNOSES   Final diagnoses:  Dementia without behavioral disturbance, psychotic disturbance, mood disturbance, or anxiety, unspecified dementia severity, unspecified dementia type (HCC)        Note:  This  document was prepared using Conservation officer, historic buildings and may include unintentional dictation errors.    Phineas Semen, MD 08/05/21 [phone]

## 2021-08-05 NOTE — ED Notes (Signed)
Brief changed by this rn

## 2021-08-05 NOTE — ED Triage Notes (Signed)
FIRST NURSE NOTE: Pt via EMS from [facility]. Per EMS, pt has been uncooperative pt has a hx of dementia, per EMS [facility] does not have a dementia unit. States that pt cooperated with them and pt is at his baseline

## 2021-08-05 NOTE — ED Notes (Signed)
Pt will not leave BP cuff , pulse ox, or ekg monitor on.  Pt is on bed alarm

## 2021-08-06 NOTE — ED Notes (Addendum)
This RN and EDT at bedside. Pt still refuses to keep brief on. Pt with soiled linens and small BM noted. When rolling pt to perform hygiene pt attempted to punch and kick staff continuously. Pt very uncooperative and verbally aggressive with staff. After several attempts pt was provided clean sheets, chux and brief. Bed alarm remains in place. Pt will not let RN get vitals at this time.  ?

## 2021-08-06 NOTE — ED Notes (Addendum)
EDT at bedside to feed pt. Pt consumed 100% of meal tray and four grape juices and 1/2 sweet tea.  ?

## 2021-08-06 NOTE — ED Notes (Signed)
This tech and [name], performed inc care. New brief applied. Mittens applied due to pt taking brief and pulling monitor cables off.  ?

## 2021-08-06 NOTE — ED Notes (Signed)
Pt wife updated at this time.

## 2021-08-06 NOTE — NC FL2 (Signed)
?  [place] MEDICAID FL2 LEVEL OF CARE SCREENING TOOL  ?  ? ?IDENTIFICATION  ?Patient Name: ?Edwin [name] Birthdate: [birth year] Sex: male Admission Date (Current Location): ?08/05/2021  ?[place] and [place] Number: ? [place] ?  Facility and Address:  ?[facility], [address] [place], [place] [address] ?     Provider Number: ?[id number]  ?Attending Physician Name and Address:  ?No att. providers found ? Relative Name and Phone Number:  ?Arlin Vorwald- wife- [phone] ?   ?Current Level of Care: ?Hospital Recommended Level of Care: ?[place] Prior Approval Number: ?  ? ?Date Approved/Denied: ?  PASRR Number: ?[id number] ? ?Discharge Plan: ?SNF ?  ? ?Current Diagnoses: ?Patient Active Problem List  ? Diagnosis Date Noted  ? Malnutrition of moderate degree 07/29/2021  ? Closed right hip fracture ([place]) 07/26/2021  ? Dementia without behavioral disturbance ([place]) 07/26/2021  ? ? ?Orientation RESPIRATION BLADDER Height & Weight   ?  ?Self ? Normal Continent Weight: 63.5 kg ?Height:  5\' 11"  (180.3 cm)  ?BEHAVIORAL SYMPTOMS/MOOD NEUROLOGICAL BOWEL NUTRITION STATUS  ?    Continent Diet (regular diet)  ?AMBULATORY STATUS COMMUNICATION OF NEEDS Skin   ?Limited Assist Verbally Surgical wounds (closed incision hip) ?  ?  ?  ?    ?     ?     ? ? ?Personal Care Assistance Level of Assistance  ?Bathing, Feeding, Dressing Bathing Assistance: Limited assistance ?Feeding assistance: Independent ?Dressing Assistance: Limited assistance ?   ? ?Functional Limitations Info  ?Sight, Hearing, Speech Sight Info: Adequate ?Hearing Info: Adequate ?Speech Info: Adequate  ? ? ?SPECIAL CARE FACTORS FREQUENCY  ?PT (By licensed PT), OT (By licensed OT)   ?  ?PT Frequency: 5 times per week ?OT Frequency: 5 times per week ?  ?  ?  ?   ? ? ?Contractures Contractures Info: Not present  ? ? ?Additional Factors Info  ?Code Status, Allergies Code Status Info: Full ?Allergies Info: NKA ?  ?  ?  ?    ? ?Current Medications (08/06/2021):  This is the current hospital active medication list ?Current Facility-Administered Medications  ?Medication Dose Route Frequency Provider Last Rate Last Admin  ? acetaminophen (TYLENOL) tablet 500 mg  500 mg Oral Q6H PRN Nance Pear, MD      ? aspirin EC tablet 81 mg  81 mg Oral BID Nance Pear, MD      ? HYDROcodone-acetaminophen (NORCO) 7.5-325 MG per tablet 1-2 tablet  1-2 tablet Oral Q4H PRN Nance Pear, MD      ? ?Current Outpatient Medications  ?Medication Sig Dispense Refill  ? acetaminophen (TYLENOL) 500 MG tablet Take 1 tablet (500 mg total) by mouth every 6 (six) hours as needed for mild pain or moderate pain. 30 tablet 0  ? aspirin EC 81 MG tablet Take 1 tablet (81 mg total) by mouth 2 (two) times daily. For DVT prophylaxis for 30 days after surgery. 60 tablet 0  ? HYDROcodone-acetaminophen (NORCO/VICODIN) 5-325 MG tablet Take 1 tablet by mouth every 4 (four) hours as needed.    ? ? ? ?Discharge Medications: ?Please see discharge summary for a list of discharge medications. ? ?Relevant Imaging Results: ? ?Relevant Lab Results: ? ? ?Additional Information ?SS# [id number] ? ?Shelbie Hutching, RN ? ? ? ? ?

## 2021-08-06 NOTE — Progress Notes (Signed)
PT Cancellation Note ? ?Patient Details ?Name: Jefferey Lippmann ?MRN: [id number] ?DOB: July 29, 1933 ? ? ?Cancelled Treatment:    Reason Eval/Treat Not Completed: Patient not medically ready. Per nursing note, patient is trying to kick and hit staff and being verbally abusive. Will hold PT eval until patient appropriate. RN, MD notified.  ? ? ?Arasely Akkerman ?08/06/2021, 1:29 PM ?

## 2021-08-06 NOTE — TOC Initial Note (Signed)
Transition of Care (TOC) - Initial/Assessment Note  ? ? ?Patient Details  ?Name: Edwin [name] ?MRN: [id number] ?Date of Birth: [birth year] ? ?Transition of Care (TOC) CM/SW Contact:    ?Allayne Butcher, RN ?Phone Number: ?08/06/2021, 9:52 AM ? ?Clinical Narrative:                 ?Patient came into the emergency room yesterday from [facility] for behavioral concerns at the facility.  Staff reports that patient was resistant to care.  [facility] does not want to accept patient back and patient's wife Edwin [name] does not want him to return either.  Edwin [name] would like for patient to go to another rehab.  PT and OT evals ordered, bed search started.   ? ?Expected Discharge Plan: Skilled Nursing Facility ?Barriers to Discharge: SNF Pending bed offer, ED SNF auth ? ? ?Patient Goals and CMS Choice ?Patient states their goals for this hospitalization and ongoing recovery are:: To go to another SNF- wife does not want patient to return to [facility] ?CMS Medicare.gov Compare Post Acute Care list provided to:: Patient Represenative (must comment) ?Choice offered to / list presented to : Spouse ? ?Expected Discharge Plan and Services ?Expected Discharge Plan: Skilled Nursing Facility ?  ?Discharge Planning Services: CM Consult ?Post Acute Care Choice: Skilled Nursing Facility ?Living arrangements for the past 2 months: Single Family Home ?                ?DME Arranged: N/A ?DME Agency: NA ?  ?  ?  ?HH Arranged: NA ?HH Agency: NA ?  ?  ?  ? ?Prior Living Arrangements/Services ?Living arrangements for the past 2 months: Single Family Home ?Lives with:: Spouse ?Patient language and need for interpreter reviewed:: Yes ?Do you feel safe going back to the place where you live?: No   needs rehab  ?Need for Family Participation in Patient Care: Yes (Comment) ?Care giver support system in place?: Yes (comment) (wife) ?  ?Criminal Activity/Legal Involvement Pertinent to Current Situation/Hospitalization: No - Comment as needed ? ?Activities  of Daily Living ?  ?  ? ?Permission Sought/Granted ?Permission sought to share information with : Case Manager, Magazine features editor, Family Supports ?Permission granted to share information with : Yes, Verbal Permission Granted ? Share Information with NAME: Edwin [name]- spouse- [phone] ? Permission granted to share info w AGENCY: SNF ? Permission granted to share info w Relationship: Spouse ?   ? ?Emotional Assessment ?  ?  ?  ?Orientation: : Oriented to Self ?Alcohol / Substance Use: Not Applicable ?Psych Involvement: No (comment) ? ?Admission diagnosis:  AMS  EMS ?Patient Active Problem List  ? Diagnosis Date Noted  ? Malnutrition of moderate degree 07/29/2021  ? Closed right hip fracture (HCC) 07/26/2021  ? Dementia without behavioral disturbance (HCC) 07/26/2021  ? ?PCP:  System, Provider Not In ?Pharmacy:  No Pharmacies Listed ? ? ? ?Social Determinants of Health (SDOH) Interventions ?  ? ?Readmission Risk Interventions ?No flowsheet data found. ? ? ?

## 2021-08-06 NOTE — TOC Progression Note (Signed)
Transition of Care (TOC) - Progression Note  ? ? ?Patient Details  ?Name: Edwin [name] ?MRN: [id number] ?Date of Birth: [birth year] ? ?Transition of Care (TOC) CM/SW Contact  ?Shelbie Hutching, RN ?Phone Number: ?08/06/2021, 11:23 AM ? ?Clinical Narrative:    ?Patient's wife called [facility] and reported to them that the patient was very aggressive and combative and that she would not allow him to be medicated for any of these behaviors.  [facility] has rescinded [facility] bed offer and will no long accept for rehab.   ? ? ?Expected Discharge Plan: [place] ?Barriers to Discharge: SNF Pending bed offer, ED SNF auth ? ?Expected Discharge Plan and Services ?Expected Discharge Plan: [place] ?  ?Discharge Planning Services: CM Consult ?Post Acute Care Choice: [place] ?Living arrangements for the past 2 months: [place] ?                ?DME Arranged: N/A ?DME Agency: NA ?  ?  ?  ?HH Arranged: NA ?[place] Agency: NA ?  ?  ?  ? ? ?Social Determinants of Health (SDOH) Interventions ?  ? ?Readmission Risk Interventions ?No flowsheet data found. ? ?

## 2021-08-06 NOTE — TOC Progression Note (Signed)
Transition of Care (TOC) - Progression Note  ? ? ?Patient Details  ?Name: Edwin [name] ?MRN: [id number] ?Date of Birth: [birth year] ? ?Transition of Care (TOC) CM/SW Contact  ?Shelbie Hutching, RN ?Phone Number: ?08/06/2021, 10:57 AM ? ?Clinical Narrative:    ?[facility] has offered a memory care bed and patient's wife accepts bed offer.  Ebony Hail with [facility] will start [employer]   ? ? ?Expected Discharge Plan: [place] ?Barriers to Discharge: SNF Pending bed offer, ED SNF auth ? ?Expected Discharge Plan and Services ?Expected Discharge Plan: [place] ?  ?Discharge Planning Services: CM Consult ?Post Acute Care Choice: [place] ?Living arrangements for the past 2 months: [place] ?                ?DME Arranged: N/A ?DME Agency: NA ?  ?  ?  ?HH Arranged: NA ?[place] Agency: NA ?  ?  ?  ? ? ?Social Determinants of Health (SDOH) Interventions ?  ? ?Readmission Risk Interventions ?No flowsheet data found. ? ?

## 2021-08-07 NOTE — ED Provider Notes (Signed)
Today's Vitals  ? 08/05/21 1753 08/06/21 0705 08/06/21 1514 08/06/21 1913  ?BP: (!) 135/102  120/90   ?Pulse: (!) 115  (!) 101   ?Resp: 20  18   ?Temp: (!) 97.5 ?F (36.4 ?C)     ?TempSrc: Axillary     ?SpO2: 96%  95%   ?Weight:      ?Height:      ?PainSc:  Asleep  Asleep  ? ?Body mass index is 19.53 kg/m?. ? ? ?Patient resting.  No acute complaints.  Awaiting social work disposition. ?  ?Baltazar Pekala, Layla Maw, DO ?08/07/21 0244 ? ?

## 2021-08-07 NOTE — ED Notes (Addendum)
This RN contacted Robbie Lis, SW and let her know that pt. Is currently awake and conversing/joking with staff in case PT wanted to attempt any eval/treatment. ?

## 2021-08-07 NOTE — Progress Notes (Signed)
PT Cancellation Note ? ?Patient Details ?Name: Edwin [name] ?MRN: [id number] ?DOB: 06-Jun-1934 ? ? ?Cancelled Treatment:    Reason Eval/Treat Not Completed: Fatigue/lethargy limiting ability to participate. Evaluation attempted, however pt very lethargic, verbally responds but doesn't open eyes. Confused when asked orientation questions and doesn't follow commands. Not appropriate for exertional evaluation at this time. Will re-attempt next date. ? ? ?[name] ?08/07/2021, 10:48 AM ?Elizabeth Palau, PT, DPT, GCS ?[phone] ? ?

## 2021-08-07 NOTE — ED Notes (Signed)
Pt. Notified food at bedside, pt was asleep, mumbled unintelligible sounds, went back to sleep.  ?

## 2021-08-07 NOTE — TOC Progression Note (Signed)
Transition of Care (TOC) - Progression Note  ? ? ?Patient Details  ?Name: Edwin [name] ?MRN: [id number] ?Date of Birth: [birth year] ? ?Transition of Care (TOC) CM/SW Contact  ?Allayne Butcher, RN ?Phone Number: ?08/07/2021, 2:15 PM ? ?Clinical Narrative:    ?PT has not been able to work with patient yesterday or today, today he was too lethargic.  Patient has not eaten today, he has been offered food but is not hungry.  No bed offers have been received.  RNCM called wife's cell and her home number and left a message on both for return call- patient is likely not going to qualify for SNF besides his aggressive behaviors and the wife's refusal to have him medicated for dementia.   ? ? ?Expected Discharge Plan: Skilled Nursing Facility ?Barriers to Discharge: SNF Pending bed offer, ED SNF auth ? ?Expected Discharge Plan and Services ?Expected Discharge Plan: Skilled Nursing Facility ?  ?Discharge Planning Services: CM Consult ?Post Acute Care Choice: Skilled Nursing Facility ?Living arrangements for the past 2 months: Single Family Home ?                ?DME Arranged: N/A ?DME Agency: NA ?  ?  ?  ?HH Arranged: NA ?HH Agency: NA ?  ?  ?  ? ? ?Social Determinants of Health (SDOH) Interventions ?  ? ?Readmission Risk Interventions ?No flowsheet data found. ? ?

## 2021-08-07 NOTE — ED Notes (Signed)
Pt. Awake, conversational and pleasant. Joking with staff, denies food and water at this time. ?

## 2021-08-07 NOTE — TOC Progression Note (Signed)
Transition of Care (TOC) - Progression Note  ? ? ?Patient Details  ?Name: Armistead Hunting ?MRN: [id number] ?Date of Birth: 1933/09/13 ? ?Transition of Care (TOC) CM/SW Contact  ?Shelbie Hutching, RN ?Phone Number: ?08/07/2021, 2:36 PM ? ?Clinical Narrative:    ?Patient's wife returned call, she is coming to visit with patient this afternoon.  Informed wife that patient has not been able to work with therapy yet, she wants to know if they could come when she is here- RNCM will reach out to see if they can come see patient tomorrow after lunch, she can be here after lunch.  Also reported to her that patient has no bed offers at this time.  Bed search extended.   ? ? ?Expected Discharge Plan: [place] ?Barriers to Discharge: SNF Pending bed offer, ED SNF auth ? ?Expected Discharge Plan and Services ?Expected Discharge Plan: [place] ?  ?Discharge Planning Services: CM Consult ?Post Acute Care Choice: [place] ?Living arrangements for the past 2 months: [place] ?                ?DME Arranged: N/A ?DME Agency: NA ?  ?  ?  ?HH Arranged: NA ?[place] Agency: NA ?  ?  ?  ? ? ?Social Determinants of Health (SDOH) Interventions ?  ? ?Readmission Risk Interventions ?No flowsheet data found. ? ?

## 2021-08-07 NOTE — ED Notes (Signed)
Social work at bedside, pt. Confused, but conversational. Pt. Offered food, refused. ?

## 2021-08-08 NOTE — ED Notes (Addendum)
Pt had small BM. RN did a full bed linen change and new bed pads were placed. Pericare was done and pt was placed in a new brief. Pt was given two warm blankets.  ?

## 2021-08-08 NOTE — ED Notes (Addendum)
This RN and Les, EMTP to bedside, pt. Brief and chuck changed, peri care perfomed. Offered pt. His Water. Pt. Refused. Fresh warm blankets applied. Lights dimmed. Pt. Denies further need at this time. NAD. Pt. Had moderate amount of stool and urine in brief. ?

## 2021-08-08 NOTE — Evaluation (Addendum)
Physical Therapy Evaluation ?Patient Details ?Name: Edwin [name] ?MRN: [id number] ?DOB: 21-Dec-1933 ?Today's Date: 08/08/2021 ? ?History of Present Illness ? Pt is an 88 y.o. male who presents to the ED from [facility] because of his dementia/altered mental status. S/p Post-Right Hemiarthroplasty. PmHx: Dementia ?  ?Clinical Impression ? Pt resting in bed w/ wife at bedside upon PT entrance into room for evaluation today. Pt is oriented to self and denies any c/o pain at rest. Unable to obtain full prior level of function from wife, but she reported prior to his fall they would walk 1.5 miles every other day and he did not need any assistive devices. ? ?Pt was able to perform bed mobility w/ modA and reliance on PT for LE transfer and trunk elevation. He was able to don his pajama pants w/ minA from PT at EOB, prior to ambulation. He ambulated ~28ft w/ CGA using RW; but pt became unresponsive, buckling noted and required totalAx2 for transfer back into bed and returned to supine. RN notified and in room to assess pt, but prior to PT exit pt became responsive. PT communicated potential need for orthostatic hypotension assessment. Pt will benefit from continued skilled PT in order to increase LE strength, improve mobility/gait, and restore PLOF. Current discharge recommendation remains appropriate due to the level of assistance required by the patient to ensure safety and improve overall function. ? ?   ? ?Recommendations for follow up therapy are one component of a multi-disciplinary discharge planning process, led by the attending physician.  Recommendations may be updated based on patient status, additional functional criteria and insurance authorization. ? ?Follow Up Recommendations Skilled nursing-short term rehab (<3 hours/day) ? ?  ?Assistance Recommended at Discharge Frequent or constant Supervision/Assistance  ?Patient can return home with the following ? Assistance with cooking/housework;Assist for  transportation;Help with stairs or ramp for entrance;Direct supervision/assist for medications management;A little help with walking and/or transfers;A little help with bathing/dressing/bathroom;Assistance with feeding ? ?  ?Equipment Recommendations Rolling walker (2 wheels)  ?Recommendations for Other Services ?    ?  ?Functional Status Assessment Patient has had a recent decline in their functional status and demonstrates the ability to make significant improvements in function in a reasonable and predictable amount of time.  ? ?  ?Precautions / Restrictions Precautions ?Precautions: Fall;Posterior Hip ?Restrictions ?Weight Bearing Restrictions: Yes ?RLE Weight Bearing: Weight bearing as tolerated  ? ?  ? ?Mobility ? Bed Mobility ?Overal bed mobility: Needs Assistance ?Bed Mobility: Supine to Sit, Sit to Supine ?  ?  ?Supine to sit: Mod assist ?Sit to supine: +2 for physical assistance, Total assist (Simultaneous filing. User may not have seen previous data.) ?  ?  ?  ? ?Transfers ?Overall transfer level: Needs assistance ?Equipment used: Rolling walker (2 wheels) ?Transfers: Sit to/from Stand ?Sit to Stand: Min guard ?  ?Step pivot transfers: +2 physical assistance, Total assist ?  ?  ?  ?General transfer comment: TotalAx2 for transfer back into bed because of suspected orthostatic hypotension ?  ? ?Ambulation/Gait ?  ?Gait Distance (Feet): 20 Feet ?Assistive device: Rolling walker (2 wheels) ?Gait Pattern/deviations: Decreased stride length, Step-to pattern, Decreased step length - left, Decreased stance time - right ?Gait velocity: decreased ?  ?  ?General Gait Details: Pt able to ambulate ~54ft; needed verbal directional cues. ? ?Stairs ?  ?  ?  ?  ?  ? ?Wheelchair Mobility ?  ? ?Modified Rankin (Stroke Patients Only) ?  ? ?  ? ?Balance Overall  balance assessment: Needs assistance ?Sitting-balance support: Feet supported, Single extremity supported ?Sitting balance-Leahy Scale: Fair ?  ?  ?Standing balance  support: Bilateral upper extremity supported, Single extremity supported, During functional activity ?Standing balance-Leahy Scale: Poor ?  ?  ?  ?  ?  ?  ?  ?  ?  ?  ?  ?  ?   ? ? ? ?Pertinent Vitals/Pain Pain Assessment ?Pain Assessment: No/denies pain  ? ? ?Home Living Family/patient expects to be discharged to:: Skilled nursing facility ?  ?  ?  ?  ?  ?  ?  ?  ?  ?   ?  ?Prior Function   ?  ?  ?  ?  ?  ?  ?  ?  ?  ? ? ?Hand Dominance  ?   ? ?  ?Extremity/Trunk Assessment  ? Upper Extremity Assessment ?Upper Extremity Assessment: Overall WFL for tasks assessed;Generalized weakness;Difficult to assess due to impaired cognition ?  ? ?Lower Extremity Assessment ?Lower Extremity Assessment: Overall WFL for tasks assessed;Generalized weakness;Difficult to assess due to impaired cognition ?  ? ?   ?Communication  ?    ?Cognition Arousal/Alertness: Awake/alert ?Behavior During Therapy: Flat affect ?Overall Cognitive Status: History of cognitive impairments - at baseline ?Area of Impairment: Safety/judgement, Awareness, Orientation, Attention ?  ?  ?  ?  ?  ?  ?  ?  ?Orientation Level: Time, Situation, Disoriented to, Place ?  ?  ?Following Commands: Follows one step commands inconsistently ?Safety/Judgement: Decreased awareness of deficits, Decreased awareness of safety ?  ?Problem Solving: Slow processing, Requires verbal cues, Requires tactile cues, Difficulty sequencing ?  ?  ?  ? ?  ?General Comments   ? ?  ?Exercises    ? ?Assessment/Plan  ?  ?PT Assessment Patient needs continued PT services  ?PT Problem List Decreased strength;Decreased mobility;Decreased safety awareness;Decreased knowledge of precautions;Decreased range of motion;Decreased activity tolerance;Decreased balance;Decreased knowledge of use of DME;Pain;Decreased cognition;Decreased coordination ? ?   ?  ?PT Treatment Interventions DME instruction;Therapeutic exercise;Gait training;Functional mobility training;Therapeutic activities;Patient/family  education;Balance training;Neuromuscular re-education   ? ?PT Goals (Current goals can be found in the Care Plan section)  ?Acute Rehab PT Goals ?Patient Stated Goal: to improve symptoms ?PT Goal Formulation: With patient/family ?Time For Goal Achievement: 08/22/21 ?Potential to Achieve Goals: Fair ? ?  ?Frequency Min 2X/week ?  ? ? ?Co-evaluation   ?  ?  ?  ?  ? ? ?  ?AM-PAC PT "6 Clicks" Mobility  ?Outcome Measure Help needed turning from your back to your side while in a flat bed without using bedrails?: A Lot ?Help needed moving from lying on your back to sitting on the side of a flat bed without using bedrails?: A Lot ?Help needed moving to and from a bed to a chair (including a wheelchair)?: A Little ?Help needed standing up from a chair using your arms (e.g., wheelchair or bedside chair)?: A Little ?Help needed to walk in hospital room?: A Little ?Help needed climbing 3-5 steps with a railing? : A Lot ?6 Click Score: 15 ? ?  ?End of Session Equipment Utilized During Treatment: Gait belt ?Activity Tolerance: Treatment limited secondary to medical complications (Comment) (Due to suspected orthostatic hypotension) ?Patient left: in bed;with call bell/phone within reach;with bed alarm set;with family/visitor present ?Nurse Communication: Mobility status ?PT Visit Diagnosis: Other abnormalities of gait and mobility (R26.89);History of falling (Z91.81);Difficulty in walking, not elsewhere classified (R26.2);Unsteadiness on feet (R26.81) ?  ? ?Time:  [id number] ?PT Time Calculation (min) (ACUTE ONLY): 25 min ? ? ?Charges:   PT Evaluation ?$PT Eval Low Complexity: 1 Low ?PT Treatments ?$Therapeutic Activity: 8-22 mins ?  ?   ? ? ?Jonnie Kind, SPT ?08/08/2021, 4:07 PM ? ?

## 2021-08-08 NOTE — ED Notes (Signed)
This RN attempted to give pt oral medication pt spit medication out and refuses to take it.  ?

## 2021-08-08 NOTE — ED Notes (Signed)
This RN to bedside, fed pt. One whole chopped up banana, and encouraged pt. To drink. Pt's fluid intake approx. 356mL ?

## 2021-08-08 NOTE — ED Notes (Signed)
This RN at bedside fed pt pears and gave some water to drink.  ?

## 2021-08-08 NOTE — ED Notes (Signed)
This RN gave pt some water to drink.  ?

## 2021-08-08 NOTE — ED Notes (Signed)
Pt. Sleeping in room.

## 2021-08-09 NOTE — TOC Progression Note (Signed)
Transition of Care (TOC) - Progression Note  ? ? ?Patient Details  ?Name: Edwin [name] ?MRN: [id number] ?Date of Birth: 11-Jun-1933 ? ?Transition of Care (TOC) CM/SW Contact  ?Jazzma Neidhardt L Pearlena Ow, LCSWA ?Phone Number: ?08/09/2021, 2:44 PM ? ?Clinical Narrative:    ?Patient's wife [name] called CSW and accepted bed offer at [facility].  ? ?CSW spoke to [place] from [facility] [phone] and requested them to begin authorization and she confirmed.Inetta Fermo reported current delay in their admission office. TOC will continue to follow ? ? ?Expected Discharge Plan: Skilled Nursing Facility ?Barriers to Discharge: SNF Pending bed offer, ED SNF auth ? ?Expected Discharge Plan and Services ?Expected Discharge Plan: Skilled Nursing Facility ?  ?Discharge Planning Services: CM Consult ?Post Acute Care Choice: Skilled Nursing Facility ?Living arrangements for the past 2 months: Single Family Home ?                ?DME Arranged: N/A ?DME Agency: NA ?  ?  ?  ?HH Arranged: NA ?HH Agency: NA ?  ?  ?  ? ? ?Social Determinants of Health (SDOH) Interventions ?  ? ?Readmission Risk Interventions ?No flowsheet data found. ? ?

## 2021-08-09 NOTE — ED Provider Notes (Signed)
Today's Vitals  ? 08/08/21 1500 08/08/21 1515 08/08/21 1529 08/08/21 2258  ?BP:   103/74 102/70  ?Pulse:   84 94  ?Resp: 18 14 15 14   ?Temp:   98.5 ?F (36.9 ?C) 98.4 ?F (36.9 ?C)  ?TempSrc:   Axillary Oral  ?SpO2:   100% 95%  ?Weight:      ?Height:      ?PainSc:      ? ?Body mass index is 19.53 kg/m?. ? ? ?Patient is resting comfortably.  Awaiting social work disposition. ?  ?Josy Peaden, , DO ?08/09/21 0158 ? ?

## 2021-08-09 NOTE — TOC Progression Note (Addendum)
Transition of Care (TOC) - Progression Note  ? ? ?Patient Details  ?Name: Edwin [name] ?MRN: [id number] ?Date of Birth: July 04, 1933 ? ?Transition of Care (TOC) CM/SW Contact  ?Jehan Ranganathan L Ismerai Bin, LCSWA ?Phone Number: ?08/09/2021, 9:47 AM ? ?Clinical Narrative:    ? ?CSW called patient's wife to present bed offer at [facility] and left voicemail.  ? ?Patient's wife Edwin [name] returned call and CSW presented bed offer. Edwin [name] reported she would do research on facility and follow up with CSW today. ? ? ?Expected Discharge Plan: Skilled Nursing Facility ?Barriers to Discharge: SNF Pending bed offer, ED SNF auth ? ?Expected Discharge Plan and Services ?Expected Discharge Plan: Skilled Nursing Facility ?  ?Discharge Planning Services: CM Consult ?Post Acute Care Choice: Skilled Nursing Facility ?Living arrangements for the past 2 months: Single Family Home ?                ?DME Arranged: N/A ?DME Agency: NA ?  ?  ?  ?HH Arranged: NA ?HH Agency: NA ?  ?  ?  ? ? ?Social Determinants of Health (SDOH) Interventions ?  ? ?Readmission Risk Interventions ?No flowsheet data found. ? ?

## 2021-08-09 NOTE — ED Notes (Signed)
Pt had a small BM. RN did a full bed linen change. Pericare was done and pt was placed in a new brief and was given two warm blankets.  ?

## 2021-08-10 NOTE — ED Notes (Signed)
Pt had a small BM. RN did a full bed linen change. Pericare was done and pt was placed in a new brief.  ?

## 2021-08-10 NOTE — ED Notes (Signed)
Pt in bed resting. NAD noted, respirations even and unlabored. ?

## 2021-08-10 NOTE — ED Provider Notes (Signed)
----------------------------------------- ?  6:10 AM on 08/10/2021 ?----------------------------------------- ? ? ?Blood pressure 121/84, pulse 90, temperature 97.9 ?F (36.6 ?C), temperature source Oral, resp. rate 16, height 5\' 11"  (1.803 m), weight 63.5 kg, SpO2 100 %. ? ?The patient is calm and cooperative at this time.  There have been no acute events since the last update.  Awaiting disposition plan from Social Work team. ?  ?Paulette Blanch, MD ?08/10/21 [phone] ? ?

## 2021-08-10 NOTE — TOC Progression Note (Addendum)
Transition of Care (TOC) - Progression Note  ? ? ?Patient Details  ?Name: Edwin [name] ?MRN: [id number] ?Date of Birth: 08/19/1933 ? ?Transition of Care (TOC) CM/SW Contact  ?Earnestene Angello L Amnah Breuer, LCSWA ?Phone Number: ?08/10/2021, 10:22 AM ? ?Clinical Narrative:    ? ?CSW called Gavin Pound from [facility] [phone]) to request cancellation of insurance auth there. CSW left voicemail. [facility] cannot complete insurance auth until [facility] cancels. TOC will continue to follow. ? ? ? ?Expected Discharge Plan: Skilled Nursing Facility ?Barriers to Discharge: SNF Pending bed offer, ED SNF auth ? ?Expected Discharge Plan and Services ?Expected Discharge Plan: Skilled Nursing Facility ?  ?Discharge Planning Services: CM Consult ?Post Acute Care Choice: Skilled Nursing Facility ?Living arrangements for the past 2 months: Single Family Home ?                ?DME Arranged: N/A ?DME Agency: NA ?  ?  ?  ?HH Arranged: NA ?HH Agency: NA ?  ?  ?  ? ? ?Social Determinants of Health (SDOH) Interventions ?  ? ?Readmission Risk Interventions ?No flowsheet data found. ? ?

## 2021-08-10 NOTE — ED Notes (Signed)
Pts sheets and brief changed by rn and pct  ?

## 2021-08-11 ENCOUNTER — Ambulatory Visit: Payer: Medicare HMO | Admitting: Neurology

## 2021-08-11 NOTE — ED Notes (Signed)
Pt ate 10% of his meal tray with this RN to assist with feeding. Pt resting calmly and comfortably at this time.  ?

## 2021-08-11 NOTE — ED Notes (Signed)
Pt observed resting in the bed watching TV; this RN introduced herself to the patient no concerns at this time. Respirations equal and unlabored. Will continue to monitor. ?

## 2021-08-11 NOTE — ED Notes (Signed)
Pt ate 90% of breakfast tray. Clean and dry at this time. Watching TV . ?

## 2021-08-11 NOTE — ED Notes (Signed)
Pt brief clean and dry; vitals taken. Pt assisted to eat by ED Tech. ?

## 2021-08-11 NOTE — Progress Notes (Signed)
?   08/11/21 1550  ?Clinical Encounter Type  ?Visited With Patient  ?Visit Type Initial;Social support  ?Spiritual Encounters  ?Spiritual Needs Other (Comment) ?(social)  ? ?Chaplain Burris checked-in on Pt's well-being. Pt expressed desire for water. Chaplain inquired with RN but this was not advised. Chaplain offered compassionate presence and extended her care and ongoing availability. ?

## 2021-08-11 NOTE — ED Notes (Addendum)
Pt's brief changed along with chucks. Pt repositioned and HOB at 30 degrees. Pt clean and dry resting in bed. Pt fidgety and playing with [place] activity belt. Pushed PO fluids on to pt and pt took a few sips. Pt's fluid intake has been minimal during shift.  ?

## 2021-08-11 NOTE — TOC Progression Note (Signed)
Transition of Care (TOC) - Progression Note  ? ? ?Patient Details  ?Name: Edwin [name] ?MRN: [id number] ?Date of Birth: 23-Apr-1934 ? ?Transition of Care (TOC) CM/SW Contact  ?Allayne Butcher, RN ?Phone Number: ?08/11/2021, 4:42 PM ? ?Clinical Narrative:    ?[facility] was to [employer] and tell them to cancel auth for them.  Inetta Fermo at [facility] starting auth this afternoon.  ? ? ?Expected Discharge Plan: Skilled Nursing Facility ?Barriers to Discharge: SNF Pending bed offer, ED SNF auth ? ?Expected Discharge Plan and Services ?Expected Discharge Plan: Skilled Nursing Facility ?  ?Discharge Planning Services: CM Consult ?Post Acute Care Choice: Skilled Nursing Facility ?Living arrangements for the past 2 months: Single Family Home ?                ?DME Arranged: N/A ?DME Agency: NA ?  ?  ?  ?HH Arranged: NA ?HH Agency: NA ?  ?  ?  ? ? ?Social Determinants of Health (SDOH) Interventions ?  ? ?Readmission Risk Interventions ?No flowsheet data found. ? ?

## 2021-08-11 NOTE — Progress Notes (Signed)
Physical Therapy Treatment ?Patient Details ?Name: Edwin [name] ?MRN: [id number] ?DOB: 10-Nov-1933 ?Today's Date: 08/11/2021 ? ? ?History of Present Illness Pt is an 88 y.o. male who presents to the ED from [facility] because of his dementia/altered mental status. S/p Post-Right Hemiarthroplasty. PmHx: Dementia ? ?  ?PT Comments  ? ? Pt out of bed standing w/ RN due to recent bowl movement, and RN reporting he was up walking around his room prior to them entering. Pt was able to continue standing w/ CGAx2 using RW for RN to perform pericare needs. X2 assistance was not required, but necessary due to previous (+) orthostatics during previous session and current persistence/restlessness from Pt. Pt was returned in bed w/ totalAx2, required due to his persistence to not return to supine in bed. Pt left in bed w/ all needs within reach and RN notified about his restlessness currently in bed and to monitor accordingly. Pt will benefit from continued skilled PT in order to increase LE strength, improve mobility/gait, and restore PLOF. Current discharge recommendation remains appropriate due to the level of assistance required by the patient to ensure safety and improve overall function. ?  ?Recommendations for follow up therapy are one component of a multi-disciplinary discharge planning process, led by the attending physician.  Recommendations may be updated based on patient status, additional functional criteria and insurance authorization. ? ?Follow Up Recommendations ? Skilled nursing-short term rehab (<3 hours/day) ?  ?  ?Assistance Recommended at Discharge Frequent or constant Supervision/Assistance  ?Patient can return home with the following Assistance with cooking/housework;Assist for transportation;Help with stairs or ramp for entrance;Direct supervision/assist for medications management;A little help with walking and/or transfers;A little help with bathing/dressing/bathroom;Assistance with feeding ?  ?Equipment  Recommendations ? Rolling walker (2 wheels)  ?  ?Recommendations for Other Services   ? ? ?  ?Precautions / Restrictions Precautions ?Precautions: Fall;Posterior Hip ?Restrictions ?Weight Bearing Restrictions: Yes ?RLE Weight Bearing: Weight bearing as tolerated  ?  ? ?Mobility ? Bed Mobility ?  ?Bed Mobility: Sit to Supine ?  ?  ?  ?Sit to supine: Total assist ?  ?General bed mobility comments: totalA due to Pt being resistant to returning to bed ?  ? ?Transfers ?Overall transfer level: Needs assistance ?  ?Transfers: Sit to/from Stand ?Sit to Stand: Min guard ?  ?  ?  ?  ?  ?  ?  ? ?Ambulation/Gait ?Ambulation/Gait assistance: Min guard, +2 physical assistance ?Gait Distance (Feet): 6 Feet ?Assistive device: Rolling walker (2 wheels) ?Gait Pattern/deviations: Decreased stride length, Step-to pattern, Decreased step length - left, Decreased stance time - right ?Gait velocity: decreased ?  ?  ?General Gait Details: CGA due to Pt persistant and impulsivity ? ? ?Stairs ?  ?  ?  ?  ?  ? ? ?Wheelchair Mobility ?  ? ?Modified Rankin (Stroke Patients Only) ?  ? ? ?  ?Balance Overall balance assessment: Needs assistance ?Sitting-balance support: Feet supported, Single extremity supported ?Sitting balance-Leahy Scale: Fair ?  ?  ?Standing balance support: Bilateral upper extremity supported, Single extremity supported, During functional activity ?Standing balance-Leahy Scale: Fair ?  ?  ?  ?  ?  ?  ?  ?  ?  ?  ?  ?  ?  ? ?  ?Cognition Arousal/Alertness: Awake/alert ?Behavior During Therapy: Restless, Impulsive ?Overall Cognitive Status: History of cognitive impairments - at baseline ?Area of Impairment: Safety/judgement, Awareness, Orientation, Attention ?  ?  ?  ?  ?  ?  ?  ?  ?  Orientation Level: Time, Situation, Disoriented to, Place ?  ?  ?Following Commands: Follows one step commands inconsistently ?Safety/Judgement: Decreased awareness of deficits, Decreased awareness of safety ?  ?Problem Solving: Slow processing,  Requires verbal cues, Requires tactile cues, Difficulty sequencing ?  ?  ?  ? ?  ?Exercises   ? ?  ?General Comments   ?  ?  ? ?Pertinent Vitals/Pain Pain Assessment ?Pain Assessment: No/denies pain  ? ? ?Home Living   ?  ?  ?  ?  ?  ?  ?  ?  ?  ?   ?  ?Prior Function    ?  ?  ?   ? ?PT Goals (current goals can now be found in the care plan section) Progress towards PT goals: Progressing toward goals ? ?  ?Frequency ? ? ? Min 2X/week ? ? ? ?  ?PT Plan Current plan remains appropriate  ? ? ?Co-evaluation   ?  ?  ?  ?  ? ?  ?AM-PAC PT "6 Clicks" Mobility   ?Outcome Measure ? Help needed turning from your back to your side while in a flat bed without using bedrails?: A Lot ?Help needed moving from lying on your back to sitting on the side of a flat bed without using bedrails?: A Lot ?Help needed moving to and from a bed to a chair (including a wheelchair)?: A Little ?Help needed standing up from a chair using your arms (e.g., wheelchair or bedside chair)?: A Little ?Help needed to walk in hospital room?: A Little ?Help needed climbing 3-5 steps with a railing? : A Lot ?6 Click Score: 15 ? ?  ?End of Session   ?Activity Tolerance: Other (comment) (treatment limited secondary to impaired cognition) ?Patient left: in bed;with call bell/phone within reach;with bed alarm set;with family/visitor present ?Nurse Communication: Mobility status ?PT Visit Diagnosis: Other abnormalities of gait and mobility (R26.89);History of falling (Z91.81);Difficulty in walking, not elsewhere classified (R26.2);Unsteadiness on feet (R26.81) ?  ? ? ?Time: 1457-1510 ?PT Time Calculation (min) (ACUTE ONLY): 13 min ? ?Charges:             ?          ? ? ?Jonnie Kind, SPT ?08/11/2021, 3:54 PM ? ?

## 2021-08-12 LAB — CBG MONITORING, ED: Glucose-Capillary: 74 mg/dL (ref 70–99)

## 2021-08-12 NOTE — ED Notes (Signed)
With family at bedside, pt allowed this RN and family to change linen, place new chux and brief. Family requests family to sit up at edge of bed to eat, bed alarm paused per request.  ?

## 2021-08-12 NOTE — ED Provider Notes (Signed)
----------------------------------------- ?  7:13 AM on 08/12/2021 ?----------------------------------------- ? ? ?Blood pressure 128/88, pulse 89, temperature 98.7 ?F (37.1 ?C), temperature source Oral, resp. rate 16, height 5\' 11"  (1.803 m), weight 63.5 kg, SpO2 99 %. ? ?The patient is calm and cooperative at this time.  There have been no acute events since the last update.  Awaiting disposition plan from Social Work team. ?  ?Paulette Blanch, MD ?08/12/21 [phone] ? ?

## 2021-08-12 NOTE — ED Notes (Signed)
Pt given breakfast tray and encouraged to eat but went back to sleep.  ?

## 2021-08-12 NOTE — ED Notes (Signed)
Pt resting comfortably at this time Normal rise ans fall of chest. NAD noted. Call bell in reach.  ?

## 2021-08-12 NOTE — ED Notes (Signed)
This RN was called into room by family members at approximately 1335 for pt being unresponsive. Pt found to be sitting in chair being held up by family member. Pt was breathing and pulse was present. Pt had eyes open; however, he was not responding to verbal or painful stimuli. Zollie Scale, RN called for [employer] [place] to come evaluate pt. This RN called for additional help moving pt back to bed. Council Mechanic, EMT-P and [place], RN to bedside. Pt was moved back into the bed. It was discovered that pt had urinated on himself during episode. Pt remained unresponsive to painful and verbal stimuli after being moved to the bed. Pt was placed on monitor, CBG, and EKG obtained. Pt family reports that pt "does this all the time". Pt family denies hx/o seizures. Pt did have a similar episode on Friday that was witnessed by PT. ? ?After few minutes pt responsive to painful stimuli, pt opening eyes and communicating verbally with staff.  ?

## 2021-08-12 NOTE — ED Notes (Signed)
Family at bedside and upset that pt has been getting ASA and states "he is doing just fine he doesn't need that"'. Family only states they do not want pt to have coffee or tea or anything dairy as well, MD made aware to D/C ASA if not needed.  ? ?Family also provided with chair for bedside.  ?

## 2021-08-12 NOTE — ED Notes (Signed)
Pt removed his brief at this time. Pt refused to allow this RN to apply a new brief - chuck pads placed under the patient - will re-attempt at a later time.  ?

## 2021-08-12 NOTE — TOC Progression Note (Signed)
Transition of Care (TOC) - Progression Note  ? ? ?Patient Details  ?Name: Edwin [name] ?MRN: [id number] ?Date of Birth: [birth year] ? ?Transition of Care (TOC) CM/SW Contact  ?Allayne Butcher, RN ?Phone Number: ?08/12/2021, 11:22 AM ? ?Clinical Narrative:    ?[name] pending, Inetta Fermo at [facility] sent updated clinicals to [facility] this morning.  ? ? ?Expected Discharge Plan: Skilled Nursing Facility ?Barriers to Discharge: SNF Pending bed offer, ED SNF auth ? ?Expected Discharge Plan and Services ?Expected Discharge Plan: Skilled Nursing Facility ?  ?Discharge Planning Services: CM Consult ?Post Acute Care Choice: Skilled Nursing Facility ?Living arrangements for the past 2 months: Single Family Home ?                ?DME Arranged: N/A ?DME Agency: NA ?  ?  ?  ?HH Arranged: NA ?HH Agency: NA ?  ?  ?  ? ? ?Social Determinants of Health (SDOH) Interventions ?  ? ?Readmission Risk Interventions ?No flowsheet data found. ? ?

## 2021-08-12 NOTE — ED Notes (Signed)
Pt made aware of tray at bedside, pt opened eyes briefly but went back to sleep.  ?

## 2021-08-12 NOTE — ED Notes (Signed)
Pt was found to not have a brief on and this RN asked if pt needed to use the urinal and pt stated "no". Pt's bed appeared to be wet and pt refused to let this RN change bed linen or place a brief on her.   ?

## 2021-08-12 NOTE — ED Notes (Signed)
This RN who is pt's primary RN went into pt room to see why there so many staff in room and this RN was updated that pt went unresponsive and was not responding to voice or pain. Pt did end up waking up on his own and was at baseline, pt does have HX of dementia, EKG was obtained. This RN explained to family that MD ordered IV and blood work and also a CT of pt's head, family refused all of this stating "He is okay we don't need any of that, he does this all the time", MD aware.  ?

## 2021-08-12 NOTE — ED Notes (Signed)
Pt refused to let this rn change brief and chuck pad, will attempt at a later time ?

## 2021-08-13 MED ORDER — SODIUM CHLORIDE 0.9 % IV BOLUS
1000.0000 mL | Freq: Once | INTRAVENOUS | Status: AC
  Administered 2021-08-13: 1000 mL via INTRAVENOUS

## 2021-08-13 MED ORDER — ZIPRASIDONE MESYLATE 20 MG IM SOLR
20.0000 mg | Freq: Once | INTRAMUSCULAR | Status: AC
  Administered 2021-08-13: 20 mg via INTRAMUSCULAR
  Filled 2021-08-13: qty 20

## 2021-08-13 MED ORDER — LORAZEPAM 2 MG/ML IJ SOLN
1.0000 mg | Freq: Once | INTRAMUSCULAR | Status: DC
Start: 2021-08-13 — End: 2021-08-14

## 2021-08-13 NOTE — ED Notes (Signed)
Pt resting comfortably in bed, NAD. No needs identified/verbalized at this time. Bed low & locked; call light & personal items within reach. ?

## 2021-08-13 NOTE — ED Notes (Signed)
Pt's wife at bedside.

## 2021-08-13 NOTE — ED Notes (Signed)
Pt sleeping now, resting comfortably in bed, NAD, respirations even & unlabored. No needs identified at this time. Bed low & locked; bed alarm remains on; call light & personal items within reach. ?

## 2021-08-13 NOTE — ED Notes (Signed)
Pt hypotensive after med administration. Dr Lenard Lance aware. Fluids administered.  ?

## 2021-08-13 NOTE — ED Notes (Signed)
Pt resting comfortably in bed, NAD. No needs identified/verbalized at this time. Bed low & locked. Bed alarm remains on. Call light & personal items within reach. ?

## 2021-08-13 NOTE — ED Notes (Signed)
Pt resting comfortably in bed, chest rise even and unlabored, door open and visualized by this RN with monitoring cords in place, call bell within reach.  Will continue to monitor. ?

## 2021-08-13 NOTE — ED Notes (Signed)
Pt resting comfortably in bed, chest rise even and unlabored, door open and visualized by this RN with monitoring cords in place, call bell within reach.  Will continue to monitor. ?

## 2021-08-13 NOTE — ED Notes (Signed)
Pt cleaned up. New brief, chuck pad, and linens placed. Warm blankets given at this time ?

## 2021-08-13 NOTE — ED Notes (Signed)
Pt resting comfortably in bed, NAD, respirations even & unlabored. No needs identified at this time. Bed low & locked; bed alarm remain on. Call light & personal items within reach. ?

## 2021-08-13 NOTE — ED Notes (Addendum)
This RN and multiple staff changed pt brief, BM noted. Peri care provided. Pt hitting and kicking staff. After brief changed, pt trying to get out of bed. Unable to be redirected, continues to hit staff and kick. Dr Kerman Passey aware, meds administered.  ?

## 2021-08-13 NOTE — ED Notes (Signed)
Pt resting at this time with RR even and unlabored. Call bell in reach. Stretcher locked in lowest position.  ?

## 2021-08-13 NOTE — ED Notes (Signed)
Pt sleeping at this time. NAD noted, respirations even and unlabored. °

## 2021-08-13 NOTE — ED Provider Notes (Signed)
----------------------------------------- ?  10:10 AM on 08/13/2021 ?----------------------------------------- ?Patient became acutely agitated attempting to punch and kick staff members.  Given acute agitation I have ordered 20 mg of IM Geodon for both patient and staff safety. ? ?Approximately 30 minutes after Geodon patient is now sleeping calmly.  Patient's blood pressure has dropped currently 91/68.  Nurses placed an IV we will bolus 1 L of IV fluids.  We will continue to closely monitor. ? ?Blood pressure improved after 1 L bolus currently 128/75.  No acute events since medication was given. ?  ?Minna Antis, MD ?08/13/21 1511 ? ?

## 2021-08-13 NOTE — ED Notes (Signed)
Peri care performed; brief changed. Pt repositioned in bed for comfort. No additional needs verbalized/identified at this time. Bed low & locked; bed alarms on & audible. Call light & personal items within reach. Lights dimmed for pt comfort. ?

## 2021-08-13 NOTE — ED Notes (Signed)
Pt attempting to get OOB. This RN attempted to verbally re-direct pt with minimal success.  ?

## 2021-08-13 NOTE — ED Notes (Signed)
Peri care performed; pt brief, chucks pad, sheets changed & pt given new blankets. Wife updated. ?

## 2021-08-13 NOTE — ED Notes (Signed)
Pt resting comfortably in bed, NAD, respirations even & unlabored. No needs identified at this time. Bed low & locked; bed alarm remain on. Call light & personal items within reach. ?

## 2021-08-13 NOTE — ED Notes (Signed)
Pt brief dry at this time, pt hitting RN when checking brief. RN explained to pt that checking brief to see if pt needs to be changed and peri care provided.  ?

## 2021-08-14 NOTE — ED Notes (Signed)
Pt noted to be playing with his brief. New pad and brief changed by this RN and [name], NT at this time. Pt repositioned at this time for breakfast.  ?

## 2021-08-14 NOTE — Discharge Instructions (Addendum)
Orthopedic surgery recommended weightbearing as tolerated ? ?

## 2021-08-14 NOTE — TOC Progression Note (Signed)
Transition of Care (TOC) - Progression Note  ? ? ?Patient Details  ?Name: Edwin [name] ?MRN: [id number] ?Date of Birth: 1933-08-28 ? ?Transition of Care (TOC) CM/SW Contact  ?Shelbie Hutching, RN ?Phone Number: ?08/14/2021, 2:25 PM ? ?Clinical Narrative:    ? ?[employer] denied authorization for SNF.  RNCM expedited appeal.  Wife does not want patient to spend any more time in the hospital.  She wants to go to [facility], she will private pay.  Private pay rate for semi private room is $256/day and they require the first 45 days up front.  Wife agrees to pay $11,520, she will go over to [facility] now.  Patient will go to room 126, ED RN will call report to [phone].  ED Secretary calling to set up EMS transport.   ? ?Expected Discharge Plan: [place] ?Barriers to Discharge: SNF Pending bed offer, ED SNF auth ? ?Expected Discharge Plan and Services ?Expected Discharge Plan: [place] ?  ?Discharge Planning Services: CM Consult ?Post Acute Care Choice: [place] ?Living arrangements for the past 2 months: [place] ?                ?DME Arranged: N/A ?DME Agency: NA ?  ?  ?  ?HH Arranged: NA ?[place] Agency: NA ?  ?  ?  ? ? ?Social Determinants of Health (SDOH) Interventions ?  ? ?Readmission Risk Interventions ?No flowsheet data found. ? ?

## 2021-08-14 NOTE — TOC Progression Note (Signed)
Transition of Care (TOC) - Progression Note  ? ? ?Patient Details  ?Name: Edwin [name] ?MRN: [id number] ?Date of Birth: 27-Mar-1934 ? ?Transition of Care (TOC) CM/SW Contact  ?Allayne Butcher, RN ?Phone Number: ?08/14/2021, 1:52 PM ? ?Clinical Narrative:    ?Unfortunately MD was unable to complete peer to peer by deadline of 12 noon.  RNCM has called [facility] and requested and expedited appeal of SNF authorization denial.  [place] Mon [phone].  Reference number #[id number] allow 7 to 14 days for decision of appeal.   ? ? ?Expected Discharge Plan: Skilled Nursing Facility ?Barriers to Discharge: SNF Pending bed offer, ED SNF auth ? ?Expected Discharge Plan and Services ?Expected Discharge Plan: Skilled Nursing Facility ?  ?Discharge Planning Services: CM Consult ?Post Acute Care Choice: Skilled Nursing Facility ?Living arrangements for the past 2 months: Single Family Home ?                ?DME Arranged: N/A ?DME Agency: NA ?  ?  ?  ?HH Arranged: NA ?HH Agency: NA ?  ?  ?  ? ? ?Social Determinants of Health (SDOH) Interventions ?  ? ?Readmission Risk Interventions ?No flowsheet data found. ? ?

## 2021-08-14 NOTE — ED Notes (Signed)
Pt brief and and pads changed. Pt sitting up in bed.  ?

## 2021-08-14 NOTE — ED Notes (Signed)
Spoke with RN [place] and gave report on pt.  ?

## 2021-08-14 NOTE — ED Notes (Signed)
Patient incontinent of urine in bed. Pericare performed and linens changed.  ?

## 2021-08-14 NOTE — Progress Notes (Signed)
Physical Therapy Treatment ?Patient Details ?Name: Edwin [name] ?MRN: [id number] ?DOB: [birth year] ?Today's Date: 08/14/2021 ? ? ?History of Present Illness Pt is an 88 y.o. male who presents to the ED from [facility] because of his dementia/altered mental status. S/p Post-Right Hemiarthroplasty. PmHx: Dementia ? ?  ?PT Comments  ? ? Patient is confused but is cooperative during session and able to follow single step commands with multi-modal cues. He participated with LE exercises for strengthening in supine and sitting position. Patient was able to stand with assistance with poor standing balance and limited standing tolerance. Recommend to continue PT to maximize independence and decrease caregiver burden. Recommend SNF at discharge.  ?  ?Recommendations for follow up therapy are one component of a multi-disciplinary discharge planning process, led by the attending physician.  Recommendations may be updated based on patient status, additional functional criteria and insurance authorization. ? ?Follow Up Recommendations ? Skilled nursing-short term rehab (<3 hours/day) ?  ?  ?Assistance Recommended at Discharge Frequent or constant Supervision/Assistance  ?Patient can return home with the following Assistance with cooking/housework;Assist for transportation;Help with stairs or ramp for entrance;Direct supervision/assist for medications management;A little help with walking and/or transfers;A little help with bathing/dressing/bathroom;Assistance with feeding ?  ?Equipment Recommendations ? Rolling walker (2 wheels)  ?  ?Recommendations for Other Services   ? ? ?  ?Precautions / Restrictions Precautions ?Precautions: Fall;Posterior Hip ?Restrictions ?Weight Bearing Restrictions: Yes ?RLE Weight Bearing: Weight bearing as tolerated  ?  ? ?Mobility ? Bed Mobility ?Overal bed mobility: Needs Assistance ?Bed Mobility: Sit to Supine ?  ?  ?Supine to sit: Mod assist ?Sit to supine: Mod assist ?  ?General bed mobility  comments: assistance for trunk support to sit upright. assistance for LE support to return to bed. ?  ? ?Transfers ?Overall transfer level: Needs assistance ?  ?Transfers: Sit to/from Stand ?Sit to Stand: Mod assist ?  ?  ?  ?  ?  ?General transfer comment: lifting and lowering assistance provided. cues for technique, safety, task initiation. no dizziness is reported in standing position ?  ? ?Ambulation/Gait ?  ?  ?  ?  ?  ?  ?  ?General Gait Details: not attempted due to poor standing balance, fatigue with activity. would have second person for ambulation efforts due for safety due to cognitive concerns ? ? ?Stairs ?  ?  ?  ?  ?  ? ? ?Wheelchair Mobility ?  ? ?Modified Rankin (Stroke Patients Only) ?  ? ? ?  ?Balance Overall balance assessment: Needs assistance ?Sitting-balance support: Feet supported, Single extremity supported ?Sitting balance-Leahy Scale: Fair ?  ?  ?Standing balance support: No upper extremity supported ?Standing balance-Leahy Scale: Poor ?Standing balance comment: Min A - Mod A for standing balance and patient relying on bed for posterior leg support in standing ?  ?  ?  ?  ?  ?  ?  ?  ?  ?  ?  ?  ? ?  ?Cognition Arousal/Alertness: Awake/alert ?Behavior During Therapy: [facility] for tasks assessed/performed ?Overall Cognitive Status: History of cognitive impairments - at baseline ?  ?  ?  ?  ?  ?  ?  ?  ?  ?  ?  ?  ?  ?  ?  ?  ?General Comments: cues required for participation, attention to task. he required multi-modal cues hand over hand, tactile cues. ?  ?  ? ?  ?Exercises Total Joint Exercises ?Ankle Circles/Pumps: AAROM, Strengthening, Both, 10 reps,  Supine ?Hip ABduction/ADduction: AAROM, Strengthening, Right, 10 reps, Supine ?Long Arc Quad: AAROM, Strengthening, Both, 10 reps, Supine ? ?  ?General Comments General comments (skin integrity, edema, etc.): blood pressure 100/58 in supine and 135/95 in sitting. no dizziness is reported with functional activity ?  ?  ? ?Pertinent Vitals/Pain Pain  Assessment ?Pain Assessment: No/denies pain  ? ? ?Home Living   ?  ?  ?  ?  ?  ?  ?  ?  ?  ?   ?  ?Prior Function    ?  ?  ?   ? ?PT Goals (current goals can now be found in the care plan section) Acute Rehab PT Goals ?Patient Stated Goal: none stated ?PT Goal Formulation: Patient unable to participate in goal setting ?Time For Goal Achievement: 08/22/21 ?Potential to Achieve Goals: Fair ?Progress towards PT goals: Progressing toward goals ? ?  ?Frequency ? ? ? Min 2X/week ? ? ? ?  ?PT Plan Current plan remains appropriate  ? ? ?Co-evaluation   ?  ?  ?  ?  ? ?  ?AM-PAC PT "6 Clicks" Mobility   ?Outcome Measure ? Help needed turning from your back to your side while in a flat bed without using bedrails?: A Lot ?Help needed moving from lying on your back to sitting on the side of a flat bed without using bedrails?: A Lot ?Help needed moving to and from a bed to a chair (including a wheelchair)?: A Lot ?Help needed standing up from a chair using your arms (e.g., wheelchair or bedside chair)?: A Lot ?Help needed to walk in hospital room?: A Lot ?Help needed climbing 3-5 steps with a railing? : A Lot ?6 Click Score: 12 ? ?  ?End of Session Equipment Utilized During Treatment: Gait belt ?Activity Tolerance: Patient tolerated treatment well ?Patient left: in bed;with call bell/phone within reach;with bed alarm set ?Nurse Communication: Mobility status ?PT Visit Diagnosis: Other abnormalities of gait and mobility (R26.89);History of falling (Z91.81);Difficulty in walking, not elsewhere classified (R26.2);Unsteadiness on feet (R26.81) ?  ? ? ?Time: [id number] ?PT Time Calculation (min) (ACUTE ONLY): 24 min ? ?Charges:  $Therapeutic Exercise: 8-22 mins ?$Therapeutic Activity: 8-22 mins          ?          ? ?Minna Merritts, PT, MPT ? ? ? ?Edwin [name] ?08/14/2021, 1:19 PM ? ?

## 2021-08-14 NOTE — ED Notes (Signed)
Pt meal tray set up, pt eating   

## 2021-08-14 NOTE — ED Notes (Signed)
PT/OT at bedside at this time.

## 2021-08-14 NOTE — ED Notes (Signed)
Pt ate 100% of meal tray 

## 2021-08-14 NOTE — ED Provider Notes (Signed)
I assumed care of this patient approximately 0 700.  Patient is a border pending placement with concerns for worsening dementia in the setting of a right hip fracture.  During my shift patient's wife determined that she would private pay for rehab.  Patient discharged to rehab with assistance of social worker. ?  ?Gilles Chiquito, MD ?08/14/21 1444 ? ?

## 2021-08-14 NOTE — TOC Progression Note (Signed)
Transition of Care (TOC) - Progression Note  ? ? ?Patient Details  ?Name: Edwin [name] ?MRN: [id number] ?Date of Birth: 1933/12/07 ? ?Transition of Care (TOC) CM/SW Contact  ?Allayne Butcher, RN ?Phone Number: ?08/14/2021, 11:32 AM ? ?Clinical Narrative:    ?Reached out to [place] with admissions at [facility] is requesting a MD peer to peer.  Emergency room physician has agreed to complete peer to peer.  [employer] peer to peer [phone]- reference number [id number].   ? ? ?Expected Discharge Plan: Skilled Nursing Facility ?Barriers to Discharge: SNF Pending bed offer, ED SNF auth ? ?Expected Discharge Plan and Services ?Expected Discharge Plan: Skilled Nursing Facility ?  ?Discharge Planning Services: CM Consult ?Post Acute Care Choice: Skilled Nursing Facility ?Living arrangements for the past 2 months: Single Family Home ?                ?DME Arranged: N/A ?DME Agency: NA ?  ?  ?  ?HH Arranged: NA ?HH Agency: NA ?  ?  ?  ? ? ?Social Determinants of Health (SDOH) Interventions ?  ? ?Readmission Risk Interventions ?No flowsheet data found. ? ?

## 2021-08-14 NOTE — ED Notes (Signed)
Patient incontinent of urine. Peri care performed, dry brief applied and clean blanket placed on patient.  ?

## 2022-05-28 ENCOUNTER — Inpatient Hospital Stay (HOSPITAL_COMMUNITY)
Admission: EM | Admit: 2022-05-28 | Discharge: 2022-06-09 | DRG: 467 | Disposition: A | Discharge status: Skilled Nursing Facility | Payer: Medicare HMO | Attending: Internal Medicine | Admitting: Internal Medicine

## 2022-05-28 ENCOUNTER — Emergency Department (HOSPITAL_COMMUNITY): Payer: Medicare HMO

## 2022-05-28 DIAGNOSIS — F03B0 Unspecified dementia, moderate, without behavioral disturbance, psychotic disturbance, mood disturbance, and anxiety: Secondary | ICD-10-CM | POA: Diagnosis present

## 2022-05-28 DIAGNOSIS — D62 Acute posthemorrhagic anemia: Secondary | ICD-10-CM | POA: Diagnosis not present

## 2022-05-28 DIAGNOSIS — M9701XA Periprosthetic fracture around internal prosthetic right hip joint, initial encounter: Secondary | ICD-10-CM | POA: Diagnosis present

## 2022-05-28 DIAGNOSIS — R5082 Postprocedural fever: Secondary | ICD-10-CM | POA: Diagnosis not present

## 2022-05-28 DIAGNOSIS — F0390 Unspecified dementia without behavioral disturbance: Secondary | ICD-10-CM | POA: Diagnosis not present

## 2022-05-28 DIAGNOSIS — I959 Hypotension, unspecified: Secondary | ICD-10-CM | POA: Diagnosis not present

## 2022-05-28 DIAGNOSIS — W010XXA Fall on same level from slipping, tripping and stumbling without subsequent striking against object, initial encounter: Secondary | ICD-10-CM | POA: Diagnosis present

## 2022-05-28 DIAGNOSIS — Z7982 Long term (current) use of aspirin: Secondary | ICD-10-CM

## 2022-05-28 DIAGNOSIS — Z91014 Allergy to mammalian meats: Secondary | ICD-10-CM | POA: Diagnosis not present

## 2022-05-28 DIAGNOSIS — D649 Anemia, unspecified: Secondary | ICD-10-CM | POA: Diagnosis present

## 2022-05-28 DIAGNOSIS — Z91012 Allergy to eggs: Secondary | ICD-10-CM

## 2022-05-28 DIAGNOSIS — S72001A Fracture of unspecified part of neck of right femur, initial encounter for closed fracture: Secondary | ICD-10-CM | POA: Diagnosis present

## 2022-05-28 DIAGNOSIS — Y929 Unspecified place or not applicable: Secondary | ICD-10-CM

## 2022-05-28 DIAGNOSIS — S72111A Displaced fracture of greater trochanter of right femur, initial encounter for closed fracture: Principal | ICD-10-CM | POA: Diagnosis present

## 2022-05-28 DIAGNOSIS — R791 Abnormal coagulation profile: Secondary | ICD-10-CM | POA: Diagnosis present

## 2022-05-28 DIAGNOSIS — Z91011 Allergy to milk products: Secondary | ICD-10-CM | POA: Diagnosis not present

## 2022-05-28 DIAGNOSIS — F05 Delirium due to known physiological condition: Secondary | ICD-10-CM | POA: Diagnosis not present

## 2022-05-28 DIAGNOSIS — T849XXA Unspecified complication of internal orthopedic prosthetic device, implant and graft, initial encounter: Secondary | ICD-10-CM | POA: Diagnosis not present

## 2022-05-28 HISTORY — DX: Unspecified dementia, unspecified severity, without behavioral disturbance, psychotic disturbance, mood disturbance, and anxiety: F03.90

## 2022-05-28 LAB — CBC WITH DIFFERENTIAL/PLATELET
Abs Immature Granulocytes: 0.04 10*3/uL (ref 0.00–0.07)
Basophils Absolute: 0 10*3/uL (ref 0.0–0.1)
Basophils Relative: 0 %
Eosinophils Absolute: 0 10*3/uL (ref 0.0–0.5)
Eosinophils Relative: 1 %
HCT: 38.4 % — ABNORMAL LOW (ref 39.0–52.0)
Hemoglobin: 12.6 g/dL — ABNORMAL LOW (ref 13.0–17.0)
Immature Granulocytes: 1 %
Lymphocytes Relative: 11 %
Lymphs Abs: 0.8 10*3/uL (ref 0.7–4.0)
MCH: 27.9 pg (ref 26.0–34.0)
MCHC: 32.8 g/dL (ref 30.0–36.0)
MCV: 85.1 fL (ref 80.0–100.0)
Monocytes Absolute: 0.6 10*3/uL (ref 0.1–1.0)
Monocytes Relative: 8 %
Neutro Abs: 5.6 10*3/uL (ref 1.7–7.7)
Neutrophils Relative %: 79 %
Platelets: 159 10*3/uL (ref 150–400)
RBC: 4.51 MIL/uL (ref 4.22–5.81)
RDW: 15.3 % (ref 11.5–15.5)
WBC: 7.1 10*3/uL (ref 4.0–10.5)
nRBC: 0 % (ref 0.0–0.2)

## 2022-05-28 LAB — PROTIME-INR
INR: 1.3 — ABNORMAL HIGH (ref 0.8–1.2)
Prothrombin Time: 15.8 seconds — ABNORMAL HIGH (ref 11.4–15.2)

## 2022-05-28 LAB — BASIC METABOLIC PANEL
Anion gap: 8 (ref 5–15)
BUN: 22 mg/dL (ref 8–23)
CO2: 27 mmol/L (ref 22–32)
Calcium: 9.5 mg/dL (ref 8.9–10.3)
Chloride: 106 mmol/L (ref 98–111)
Creatinine, Ser: 0.93 mg/dL (ref 0.61–1.24)
GFR, Estimated: >60 mL/min (ref >60)
Glucose, Bld: 101 mg/dL — ABNORMAL HIGH (ref 70–99)
Potassium: 4.6 mmol/L (ref 3.5–5.1)
Sodium: 141 mmol/L (ref 135–145)

## 2022-05-28 LAB — TYPE AND SCREEN
ABO/RH(D): O POS
Antibody Screen: NEGATIVE

## 2022-05-28 NOTE — ED Triage Notes (Signed)
Pt arrived from home. 15 mins prior to arrival, pt  had fallen on the right hip. Pt had right hip surgery x1 year ago. Pt able to bear weight on right side per EMS. Pt has some swelling to the right thigh but no visible signs of a break. Hips appear stable, sensitive to rolling. Pt vital signs were stable during transport. Pt has hx of alzheimer's. Aox2 at baseline

## 2022-05-28 NOTE — H&P (Signed)
Edwin [name] AUQ:[id number] DOB: [birth year] DOA: 05/28/2022     PCP: [name], DO      Orthopedics Dr. Eulah Pont Patient arrived to ER on 05/28/22 at 2125 Referred by Attending Tegeler, Canary Brim, *   Patient coming from:    home Lives  With family    Chief Complaint:  Chief Complaint  Patient presents with   Fall    HPI: Edwin [name] is a 88 y.o. male with medical history significant of dementia prior history of right hip fracture status post hip replacement  Presented with fall and right hip  Has hx of dementia  Had a mechanical fall right hip  Had previous Right hip replacement Denies hitting head    At baseline walks half a mile every day, able to walk a flight of stairs   No recent fever or chills  He was with a caretaker  lost his balance and fell  Family states that every time he goes to the the doctor he never takes any medication or he has a problem So family request no medications to be given   Regarding pertinent Chronic problems:       Dementia -not on meds    While in ER:   Noted to have a right periprosthetic fracture orthopedics has been consulted plan to admit to internal medicine will see in the morning    CXR -  NON acute   Right hip Total right hip arthroplasty with acute periprostatic proximal femoral fracture involving the greater trochanter and proximal shaft.   Following Medications were ordered in ER: Medications - No data to display  _______________________________________________________ ER Provider Called:     Dr. Dion Saucier They Recommend admit to medicine May require revision surgery.  Will need medical admission, optimization, full consult to follow.  Spoken with family.  Npo after midnight, possible surgery tomorrow, although no solid plans yet.  He may need a revision surgeon subspecialist.       ED Triage Vitals [05/28/22 2145]  Enc Vitals Group     BP 138/86     Pulse Rate 92     Resp 20     Temp 97.8 F  (36.6 C)     Temp src      SpO2 96 %     Weight      Height      Head Circumference      Peak Flow      Pain Score      Pain Loc      Pain Edu?      Excl. in GC?   [facility](37)@     _________________________________________ Significant initial  Findings: Abnormal Labs Reviewed  BASIC METABOLIC PANEL - Abnormal; Notable for the following components:      Result Value   Glucose, Bld 101 (*)    All other components within normal limits  CBC WITH DIFFERENTIAL/PLATELET - Abnormal; Notable for the following components:   Hemoglobin 12.6 (*)    HCT 38.4 (*)    All other components within normal limits  PROTIME-INR - Abnormal; Notable for the following components:   Prothrombin Time 15.8 (*)    INR 1.3 (*)    All other components within normal limits      ECG: Ordered    The recent clinical data is shown below. Vitals:   05/28/22 2145  BP: 138/86  Pulse: 92  Resp: 20  Temp: 97.8 F (36.6 C)  SpO2: 96%     WBC  Component Value Date/Time   WBC 7.1 05/28/2022 2240   LYMPHSABS 0.8 05/28/2022 2240   MONOABS 0.6 05/28/2022 2240   EOSABS 0.0 05/28/2022 2240   BASOSABS 0.0 05/28/2022 2240      _______________________________________________ Hospitalist was called for admission for   Closed fracture of right hip, initial encounter   The following Work up has been ordered so far:  Orders Placed This Encounter  Procedures   DG Hip Unilat W or Wo Pelvis 2-3 Views Right   DG Chest Portable 1 View   Basic metabolic panel   CBC with Differential   Protime-INR   Diet NPO time specified   Check CMS   Bed rest   Initiate Carrier Fluid Protocol   Consult to orthopedic surgery   Consult to hospitalist   ED EKG   Type and screen [place] initial  Findings:  labs showing:  Recent Labs  Lab 05/28/22 2240  NA 141  K 4.6  CO2 27  GLUCOSE 101*  BUN 22  CREATININE 0.93  CALCIUM 9.5    Cr  stable,  Lab Results  Component  Value Date   CREATININE 0.93 05/28/2022   CREATININE 1.06 08/05/2021   CREATININE 0.90 07/31/2021    No results for input(s): "AST", "ALT", "ALKPHOS", "BILITOT", "PROT", "ALBUMIN" in the last 168 hours. Lab Results  Component Value Date   CALCIUM 9.5 05/28/2022    Plt: Lab Results  Component Value Date   PLT 159 05/28/2022     COVID-19 Labs  No results for input(s): "DDIMER", "FERRITIN", "LDH", "CRP" in the last 72 hours.  Lab Results  Component Value Date   SARSCOV2NAA NEGATIVE 08/05/2021   SARSCOV2NAA NEGATIVE 08/01/2021   SARSCOV2NAA NEGATIVE 07/26/2021   [place] NEGATIVE 03/14/2021       Recent Labs  Lab 05/28/22 2240  WBC 7.1  NEUTROABS 5.6  HGB 12.6*  HCT 38.4*  MCV 85.1  PLT 159    HG/HCT   stable,      Component Value Date/Time   HGB 12.6 (L) 05/28/2022 2240   HCT 38.4 (L) 05/28/2022 2240   MCV 85.1 05/28/2022 2240     No results for input(s): "LIPASE", "AMYLASE" in the last 168 hours. No results for input(s): "AMMONIA" in the last 168 hours.    Cardiac Panel (last 3 results) No results for input(s): "CKTOTAL", "CKMB", "TROPONINI", "RELINDX" in the last 72 hours.  .car BNP (last 3 results) No results for input(s): "BNP" in the last 8760 hours.    DM  labs:  HbA1C: No results for input(s): "HGBA1C" in the last 8760 hours.     CBG (last 3)  No results for input(s): "GLUCAP" in the last 72 hours.        Cultures: No results found for: "SDES", "SPECREQUEST", "CULT", "REPTSTATUS"   Radiological Exams on Admission: DG Chest Portable 1 View  Result Date: 05/28/2022 CLINICAL DATA:  Hip fracture, preoperative assessment. EXAM: PORTABLE CHEST 1 VIEW COMPARISON:  07/26/2021. FINDINGS: The heart is enlarged and the mediastinal contour is stable. No consolidation, effusion, or pneumothorax. No acute osseous abnormality. IMPRESSION: No active disease. Electronically Signed   By: Brett Fairy M.D.   On: 05/28/2022 22:37   DG Hip Unilat W or  Wo Pelvis 2-3 Views Right  Result Date: 05/28/2022 CLINICAL DATA:  Insert streak EXAM: DG HIP (WITH OR WITHOUT PELVIS) 2-3V RIGHT COMPARISON:  X-ray right hip 07/27/2021 FINDINGS: Total right hip arthroplasty with acute periprostatic  proximal femoral fracture involving the greater trochanter and proximal shaft. No dislocation of the surgical hardware. Frontal view left hip demonstrates no acute displaced fracture or dislocation. Mild degenerative changes of the left hip. No acute displaced fracture or diastasis of the bones of the pelvis. IMPRESSION: Total right hip arthroplasty with acute periprostatic proximal femoral fracture involving the greater trochanter and proximal shaft. Electronically Signed   By: Iven Finn M.D.   On: 05/28/2022 22:11   _______________________________________________________________________________________________________ Latest  Blood pressure 138/86, pulse 92, temperature 97.8 F (36.6 C), resp. rate 20, SpO2 96 %.   Vitals  labs and radiology finding personally reviewed  Review of Systems:    Pertinent positives include: fall     All systems reviewed and apart from [place] all are negative _______________________________________________________________________________________________ Past Medical History:  No past medical history on file.    Past Surgical History:  Procedure Laterality Date   HIP ARTHROPLASTY Right 07/27/2021   Procedure: ARTHROPLASTY BIPOLAR HIP (HEMIARTHROPLASTY);  Surgeon: Renette Butters, MD;  Location: [facility];  Service: Orthopedics;  Laterality: Right;    Social History:   Ambulatory   independently      reports that he has never smoked. He has never used smokeless tobacco. He reports that he does not currently use alcohol. He reports that he does not currently use drugs.     Family History:    Family History  Problem Relation Age of Onset   Dementia Sister    Dementia Brother     ______________________________________________________________________________________________ Allergies: Allergies  Allergen Reactions   Beef-Derived Products Other (See Comments)    Requested by wife.   Fish-Derived Products Other (See Comments)    Requested by wife.     Prior to Admission medications   Medication Sig Start Date End Date Taking? Authorizing Provider  acetaminophen (TYLENOL) 500 MG tablet Take 1 tablet (500 mg total) by mouth every 6 (six) hours as needed for mild pain or moderate pain. 07/30/21   Britt Bottom, PA-C  aspirin EC 81 MG tablet Take 1 tablet (81 mg total) by mouth 2 (two) times daily. For DVT prophylaxis for 30 days after surgery. 07/30/21   Britt Bottom, PA-C  HYDROcodone-acetaminophen (NORCO/VICODIN) 5-325 MG tablet Take 1 tablet by mouth every 4 (four) hours as needed. 08/04/21   [provider]    ___________________________________________________________________________________________________ Physical Exam:    05/28/2022    9:45 PM 08/14/2021    4:14 PM 08/14/2021    5:29 AM  Vitals with BMI  Systolic [id number]  Diastolic 86 99 65  Pulse 92 97 107     1. General:  in No  Acute distress   Chronically ill  -appearing 2. Psychological: Alert and   Oriented 3. Head/ENT:    Dry Mucous Membranes                          Head Non traumatic, neck supple                           Poor Dentition 4. SKIN:  decreased Skin turgor,  Skin clean Dry and intact no rash 5. Heart: Regular rate and rhythm no  Murmur, no Rub or gallop 6. Lungs:  no wheezes or crackles   7. Abdomen: Soft,  non-tender, Non distended  bowel sounds present 8. Lower extremities: no clubbing, cyanosis, no  edema 9. Neurologically Grossly intact, moving all 4  extremities equally  10. MSK: Normal range of motion    Chart has been reviewed   ________  Assessment/Plan 88 y.o. male with medical history significant of dementia prior history of right hip fracture  status post hip replacement   Admitted for   Closed fracture of right hip, I    Present on Admission:  Dementia without behavioral disturbance (HCC)  Closed right hip fracture (HCC)   Dementia without behavioral disturbance (HCC) Monitor for any sign of sundowning while hospitalized patient is pleasantly demented  Closed right hip fracture (HCC)  - management as per orthopedics,  plan to operate   in  a.m.   Keep nothing by mouth post midnight. Patient  not on anticoagulation or antiplatelet agents    Ordered type and screen,  order a vitamin D level Patient currently not on any pain Discussed with wife who states she does not wish for patient to have any medications given unless it is personally discussed with her at the time. If patient is disc having any discomfort nurse to call the wife at which point they can discuss if it is okay to give him any medications Patient does appear to be comfortable at this time Wife who is his MPOA is adamant that no medications to be given to the patient unless they have been previously given personally by Dr. Eulah Pont on upon review of the records only IV fluids have been ordered by Dr. Eulah Pont in the past although patient have had morphine aspirin Ativan Norco and Tylenol in the past and tolerated everything well. If develops any significant pain can try to further readdress with family the patient apparently has tolerated this medications well in the past And should try this time Patient at baseline  able to walk a flight of stairs or 100 feet      Patient, family denies any chest pain or shortness of breath currently and/or with exertion,  ECG ordered  no known history of coronary artery disease,  COPD  Liver failure  CKD  Given advanced age patient is at least moderate  risk   which has been discussed with family but at this point no furthther cardiac workup is indicated.      Other plan as per orders.  DVT prophylaxis:  SCD      Code Status:     Code Status: Prior FULL CODE as per family  I had personally discussed CODE STATUS with  family     Family Communication:   Family   at  Bedside  plan of care was discussed on the phone with  Wife   Disposition Plan:     likely will need placement for rehabilitation                            Following barriers for discharge:                            Will need hip replacement repair                           Will need consultants to evaluate patient prior to discharge                    Transition of care consulted  Nutrition    consulted                   Consults called: Orthopedics is aware  Admission status:  ED Disposition     ED Disposition  Admit   Condition  --   [place]: [facility] [id number]  Level of Care: Med-Surg [[id number]]  May admit patient to [facility] or [facility] if equivalent level of care is available:: No  Covid Evaluation: Asymptomatic - no recent exposure (last 10 days) testing not required  [facility]: [id number]  Admitting Physician: Toy Baker [[id number]  Attending Physician: Toy Baker [id number]  Certification:: I certify this patient will need inpatient services for at least 2 midnights  Estimated Length of Stay: 2            inpatient     I Expect 2 midnight stay secondary to severity of patient's current illness need for inpatient interventions justified by the following:    Severe lab/radiological/exam abnormalities including:   Right hip periprosthetic fracture and extensive comorbidities including:  Dementia   That are currently affecting medical management.   I expect  patient to be hospitalized for 2 midnights requiring inpatient medical care.  Patient is at high risk for adverse outcome (such as loss of life or disability) if not treated.  Indication for inpatient stay as follows:       inability to maintain oral hydration    Need  for operative/procedural  intervention    Need for   IV fluids,     Level of care  medical floor    Toy Baker 05/29/2022, 12:52 AM    [facility]     after 2 AM please page floor coverage PA If 7AM-7PM, please contact the day team taking care of the patient using [email or web address]   Patient was evaluated in the context of the global COVID-19 pandemic, which necessitated consideration that the patient might be at risk for infection with the SARS-CoV-2 virus that causes COVID-19. Institutional protocols and algorithms that pertain to the evaluation of patients at risk for COVID-19 are in a state of rapid change based on information released by regulatory bodies including the CDC and federal and state organizations. These policies and algorithms were followed during the patient's care.

## 2022-05-28 NOTE — Assessment & Plan Note (Signed)
Monitor for any sign of sundowning while hospitalized patient is pleasantly demented

## 2022-05-28 NOTE — ED Provider Notes (Signed)
[facility]-EMERGENCY DEPT Provider Note   CSN: [id number] Arrival date & time: 05/28/22  2125     History  Chief Complaint  Patient presents with   Edwin [name] is a 88 y.o. male.  The history is provided by the patient and medical records. The history is limited by the condition of the patient. No language interpreter was used.  Fall This is a new problem. The problem occurs constantly. The problem has not changed since onset.Pertinent negatives include no chest pain, no abdominal pain, no headaches and no shortness of breath. Nothing aggravates the symptoms. Nothing relieves the symptoms. He has tried nothing for the symptoms. The treatment provided no relief.       Home Medications Prior to Admission medications   Medication Sig Start Date End Date Taking? Authorizing Provider  acetaminophen (TYLENOL) 500 MG tablet Take 1 tablet (500 mg total) by mouth every 6 (six) hours as needed for mild pain or moderate pain. 07/30/21   Jenne Pane, PA-C  aspirin EC 81 MG tablet Take 1 tablet (81 mg total) by mouth 2 (two) times daily. For DVT prophylaxis for 30 days after surgery. 07/30/21   Jenne Pane, PA-C  HYDROcodone-acetaminophen (NORCO/VICODIN) 5-325 MG tablet Take 1 tablet by mouth every 4 (four) hours as needed. 08/04/21   [provider]      Allergies    Beef-derived products and Fish-derived products    Review of Systems   Review of Systems  Constitutional:  Negative for chills and fatigue.  HENT:  Negative for congestion.   Respiratory:  Negative for cough, chest tightness, shortness of breath and wheezing.   Cardiovascular:  Negative for chest pain.  Gastrointestinal:  Negative for abdominal pain, constipation, diarrhea, nausea and vomiting.  Genitourinary:  Negative for dysuria and flank pain.  Skin:  Negative for rash and wound.  Neurological:  Negative for light-headedness and headaches.  Psychiatric/Behavioral:   Negative for agitation and confusion.   All other systems reviewed and are negative.   Physical Exam Updated Vital Signs BP 138/86   Pulse 92   Temp 97.8 F (36.6 C)   Resp 20   SpO2 96%  Physical Exam Vitals and nursing note reviewed.  Constitutional:      General: He is not in acute distress.    Appearance: He is well-developed. He is not ill-appearing, toxic-appearing or diaphoretic.  HENT:     Head: Normocephalic and atraumatic.     Mouth/Throat:     Mouth: Mucous membranes are moist.  Eyes:     Conjunctiva/sclera: Conjunctivae normal.     Pupils: Pupils are equal, round, and reactive to light.  Cardiovascular:     Rate and Rhythm: Normal rate and regular rhythm.     Heart sounds: No murmur heard. Pulmonary:     Effort: Pulmonary effort is normal. No respiratory distress.     Breath sounds: Normal breath sounds. No wheezing, rhonchi or rales.  Chest:     Chest wall: No tenderness.  Abdominal:     Palpations: Abdomen is soft.     Tenderness: There is no abdominal tenderness. There is no guarding or rebound.  Musculoskeletal:        General: Tenderness and signs of injury present. No swelling.     Cervical back: Neck supple.  Skin:    General: Skin is warm and dry.     Capillary Refill: Capillary refill takes less than 2 seconds.  Findings: No erythema or rash.  Neurological:     General: No focal deficit present.     Mental Status: He is alert.     Sensory: No sensory deficit.     Motor: No weakness.  Psychiatric:        Mood and Affect: Mood normal.     ED Results / Procedures / Treatments   Labs (all labs ordered are listed, but only abnormal results are displayed) Labs Reviewed  BASIC METABOLIC PANEL - Abnormal; Notable for the following components:      Result Value   Glucose, Bld 101 (*)    All other components within normal limits  CBC WITH DIFFERENTIAL/PLATELET - Abnormal; Notable for the following components:   Hemoglobin 12.6 (*)    HCT  38.4 (*)    All other components within normal limits  PROTIME-INR - Abnormal; Notable for the following components:   Prothrombin Time 15.8 (*)    INR 1.3 (*)    All other components within normal limits  CK  HEPATIC FUNCTION PANEL  MAGNESIUM  PHOSPHORUS  PREALBUMIN  VITAMIN D 25 HYDROXY (VIT D DEFICIENCY, FRACTURES)  TYPE AND SCREEN    EKG None  Radiology DG Chest Portable 1 View  Result Date: 05/28/2022 CLINICAL DATA:  Hip fracture, preoperative assessment. EXAM: PORTABLE CHEST 1 VIEW COMPARISON:  07/26/2021. FINDINGS: The heart is enlarged and the mediastinal contour is stable. No consolidation, effusion, or pneumothorax. No acute osseous abnormality. IMPRESSION: No active disease. Electronically Signed   By: Thornell Sartorius M.D.   On: 05/28/2022 22:37   DG Hip Unilat W or Wo Pelvis 2-3 Views Right  Result Date: 05/28/2022 CLINICAL DATA:  Insert streak EXAM: DG HIP (WITH OR WITHOUT PELVIS) 2-3V RIGHT COMPARISON:  X-ray right hip 07/27/2021 FINDINGS: Total right hip arthroplasty with acute periprostatic proximal femoral fracture involving the greater trochanter and proximal shaft. No dislocation of the surgical hardware. Frontal view left hip demonstrates no acute displaced fracture or dislocation. Mild degenerative changes of the left hip. No acute displaced fracture or diastasis of the bones of the pelvis. IMPRESSION: Total right hip arthroplasty with acute periprostatic proximal femoral fracture involving the greater trochanter and proximal shaft. Electronically Signed   By: Tish Frederickson M.D.   On: 05/28/2022 22:11    Procedures Procedures    Medications Ordered in ED Medications - No data to display  ED Course/ Medical Decision Making/ A&P                           Medical Decision Making Amount and/or Complexity of Data Reviewed Labs: ordered. Radiology: ordered.  Risk Decision regarding hospitalization.    Edwin [name] is a 87 y.o. male with a past  medical history significant for dementia, previous right hip fracture status post arthroplasty on 07/27/2021 by Dr. Renaye Rakers who presents for right hip injury after a fall.  According to report, patient had a mechanical fall today falling on his right hip and has had difficulty bearing weight since.  Patient reports he does not walk normally.  He is demented and does not know what happens but is denying any other symptoms.  Denies any preceding fevers, chills, ingestion, cough, nausea, vomiting, constipation, or diarrhea symptoms.  Just complaining of the right hip pain with palpation.  On exam, patient does have tenderness to the right hip.  Otherwise he was able to wiggle his foot and toes, and had sensation and pulses.  No  laceration seen on exam.  Lungs clear and chest nontender.  Abdomen nontender.  Patient denies other problems.  Patient had x-ray of the right hip which does indeed show periprosthetic hip fracture.  Will get screening labs and chest x-ray and he will be admitted to medicine.  I spoke with orthopedics who will see him in the morning and agrees with admission plan.  They requested n.p.o. at midnight which we will order.  Patient will be admitted for further management after labs are completed.         Final Clinical Impression(s) / ED Diagnoses Final diagnoses:  Closed fracture of right hip, initial encounter (HCC)     Clinical Impression: 1. Closed fracture of right hip, initial encounter [facility])     Disposition: Admit  This note was prepared with assistance of Dragon voice recognition software. Occasional wrong-word or sound-a-like substitutions may have occurred due to the inherent limitations of voice recognition software.     Ameliah Baskins, Canary Brim, MD 05/29/22 [phone]

## 2022-05-28 NOTE — Progress Notes (Addendum)
Patient with right periprosthetic hip fracture, index operation done earlier this year by Dr. Eulah Pont, appears to have subsided. May require revision surgery.  Will need medical admission, optimization, full consult to follow.  Spoken with family.  Npo after midnight, possible surgery tomorrow, although no solid plans yet.  He may need a revision surgeon subspecialist.    Eulas Post, MD

## 2022-05-28 NOTE — ED Provider Notes (Incomplete)
[facility] DEPT Provider Note   CSN: [id number] Arrival date & time: 05/28/22  2125     History {Add pertinent medical, surgical, social history, OB history to HPI:1} Chief Complaint  Patient presents with  . Fall    Muiz Carli is a 88 y.o. male.   Fall       Home Medications Prior to Admission medications   Medication Sig Start Date End Date Taking? Authorizing Provider  acetaminophen (TYLENOL) 500 MG tablet Take 1 tablet (500 mg total) by mouth every 6 (six) hours as needed for mild pain or moderate pain. 07/30/21   Britt Bottom, PA-C  aspirin EC 81 MG tablet Take 1 tablet (81 mg total) by mouth 2 (two) times daily. For DVT prophylaxis for 30 days after surgery. 07/30/21   Britt Bottom, PA-C  HYDROcodone-acetaminophen (NORCO/VICODIN) 5-325 MG tablet Take 1 tablet by mouth every 4 (four) hours as needed. 08/04/21   [provider]      Allergies    Beef-derived products and Fish-derived products    Review of Systems   Review of Systems  Physical Exam Updated Vital Signs BP 138/86   Pulse 92   Temp 97.8 F (36.6 C)   Resp 20   SpO2 96%  Physical Exam  ED Results / Procedures / Treatments   Labs (all labs ordered are listed, but only abnormal results are displayed) Labs Reviewed  BASIC METABOLIC PANEL  CBC WITH DIFFERENTIAL/PLATELET  PROTIME-INR  TYPE AND SCREEN    EKG None  Radiology DG Chest Portable 1 View  Result Date: 05/28/2022 CLINICAL DATA:  Hip fracture, preoperative assessment. EXAM: PORTABLE CHEST 1 VIEW COMPARISON:  07/26/2021. FINDINGS: The heart is enlarged and the mediastinal contour is stable. No consolidation, effusion, or pneumothorax. No acute osseous abnormality. IMPRESSION: No active disease. Electronically Signed   By: Brett Fairy M.D.   On: 05/28/2022 22:37   DG Hip Unilat W or Wo Pelvis 2-3 Views Right  Result Date: 05/28/2022 CLINICAL DATA:  Insert streak EXAM: DG HIP (WITH  OR WITHOUT PELVIS) 2-3V RIGHT COMPARISON:  X-ray right hip 07/27/2021 FINDINGS: Total right hip arthroplasty with acute periprostatic proximal femoral fracture involving the greater trochanter and proximal shaft. No dislocation of the surgical hardware. Frontal view left hip demonstrates no acute displaced fracture or dislocation. Mild degenerative changes of the left hip. No acute displaced fracture or diastasis of the bones of the pelvis. IMPRESSION: Total right hip arthroplasty with acute periprostatic proximal femoral fracture involving the greater trochanter and proximal shaft. Electronically Signed   By: Iven Finn M.D.   On: 05/28/2022 22:11    Procedures Procedures  {Document cardiac monitor, telemetry assessment procedure when appropriate:1}  Medications Ordered in ED Medications - No data to display  ED Course/ Medical Decision Making/ A&P                           Medical Decision Making Amount and/or Complexity of Data Reviewed Labs: ordered. Radiology: ordered.  Risk Decision regarding hospitalization.    Dionisio Shoff is a 88 y.o. male with a past medical history significant for dementia, previous right hip fracture status post arthroplasty on 07/27/2021 by Dr. Fredonia Highland who presents for right hip injury after a fall.  According to report, patient had a mechanical fall today falling on his right hip and has had difficulty bearing weight since.  Patient reports he does not walk normally.  He is  demented and does not know what happens but is denying any other symptoms.  Denies any preceding fevers, chills, ingestion, cough, nausea, vomiting, constipation, or diarrhea symptoms.  Just complaining of the right hip pain with palpation.  On exam, patient does have tenderness to the right hip.  Otherwise he was able to wiggle his foot and toes, and had sensation and pulses.  No laceration seen on exam.  Lungs clear and chest nontender.  Abdomen nontender.  Patient denies other  problems.  Patient had x-ray of the right hip which does indeed show periprosthetic hip fracture.  Will get screening labs and chest x-ray and he will be admitted to medicine.  I spoke with orthopedics who will see him in the morning and agrees with admission plan.  They requested n.p.o. at midnight which we will order.  Patient will be admitted for further management after labs are completed.    {Document critical care time when appropriate:1} {Document review of labs and clinical decision tools ie heart score, Chads2Vasc2 etc:1}  {Document your independent review of radiology images, and any outside records:1} {Document your discussion with family members, caretakers, and with consultants:1} {Document social determinants of health affecting pt's care:1} {Document your decision making why or why not admission, treatments were needed:1} Final Clinical Impression(s) / ED Diagnoses Final diagnoses:  None    Rx / DC Orders ED Discharge Orders     None

## 2022-05-28 NOTE — Subjective & Objective (Signed)
  Has hx of dementia  Had a mechanical fall right hip  Had previous Right hip replacement Denies hitting head

## 2022-05-29 DIAGNOSIS — S72001A Fracture of unspecified part of neck of right femur, initial encounter for closed fracture: Secondary | ICD-10-CM | POA: Diagnosis not present

## 2022-05-29 LAB — HEPATIC FUNCTION PANEL
ALT: 25 U/L (ref 0–44)
AST: 32 U/L (ref 15–41)
Albumin: 3.6 g/dL (ref 3.5–5.0)
Alkaline Phosphatase: 63 U/L (ref 38–126)
Bilirubin, Direct: 0.2 mg/dL (ref 0.0–0.2)
Indirect Bilirubin: 0.6 mg/dL (ref 0.3–0.9)
Total Bilirubin: 0.8 mg/dL (ref 0.3–1.2)
Total Protein: 7 g/dL (ref 6.5–8.1)

## 2022-05-29 LAB — COMPREHENSIVE METABOLIC PANEL
ALT: 25 U/L (ref 0–44)
AST: 31 U/L (ref 15–41)
Albumin: 3.1 g/dL — ABNORMAL LOW (ref 3.5–5.0)
Alkaline Phosphatase: 57 U/L (ref 38–126)
Anion gap: 6 (ref 5–15)
BUN: 23 mg/dL (ref 8–23)
CO2: 26 mmol/L (ref 22–32)
Calcium: 9.1 mg/dL (ref 8.9–10.3)
Chloride: 104 mmol/L (ref 98–111)
Creatinine, Ser: 1.12 mg/dL (ref 0.61–1.24)
GFR, Estimated: >60 mL/min (ref >60)
Glucose, Bld: 111 mg/dL — ABNORMAL HIGH (ref 70–99)
Potassium: 4.3 mmol/L (ref 3.5–5.1)
Sodium: 136 mmol/L (ref 135–145)
Total Bilirubin: 0.7 mg/dL (ref 0.3–1.2)
Total Protein: 6.5 g/dL (ref 6.5–8.1)

## 2022-05-29 LAB — CBC
HCT: 38.4 % — ABNORMAL LOW (ref 39.0–52.0)
Hemoglobin: 12.1 g/dL — ABNORMAL LOW (ref 13.0–17.0)
MCH: 27.9 pg (ref 26.0–34.0)
MCHC: 31.5 g/dL (ref 30.0–36.0)
MCV: 88.7 fL (ref 80.0–100.0)
Platelets: 126 10*3/uL — ABNORMAL LOW (ref 150–400)
RBC: 4.33 MIL/uL (ref 4.22–5.81)
RDW: 15.4 % (ref 11.5–15.5)
WBC: 9 10*3/uL (ref 4.0–10.5)
nRBC: 0 % (ref 0.0–0.2)

## 2022-05-29 LAB — CK: Total CK: 165 U/L (ref 49–397)

## 2022-05-29 LAB — MAGNESIUM: Magnesium: 2.4 mg/dL (ref 1.7–2.4)

## 2022-05-29 LAB — VITAMIN D 25 HYDROXY (VIT D DEFICIENCY, FRACTURES): Vit D, 25-Hydroxy: 40.31 ng/mL (ref 30–100)

## 2022-05-29 LAB — PREALBUMIN: Prealbumin: 16 mg/dL — ABNORMAL LOW (ref 18–38)

## 2022-05-29 LAB — PHOSPHORUS: Phosphorus: 2.7 mg/dL (ref 2.5–4.6)

## 2022-05-29 MED ORDER — SODIUM CHLORIDE 0.9 % IV SOLN: INTRAVENOUS | Status: AC

## 2022-05-29 MED ORDER — SODIUM CHLORIDE 0.9 % IV SOLN: INTRAVENOUS | Status: DC

## 2022-05-29 MED ORDER — ACETAMINOPHEN 500 MG PO TABS: 1000.0000 mg | ORAL_TABLET | Freq: Four times a day (QID) | ORAL | Status: DC

## 2022-05-29 MED ORDER — ACETAMINOPHEN 500 MG PO TABS
1000.0000 mg | ORAL_TABLET | Freq: Four times a day (QID) | ORAL | Status: DC | PRN
  Administered 2022-06-01 – 2022-06-05 (×6): 1000 mg via ORAL
  Filled 2022-05-29 (×6): qty 2

## 2022-05-29 NOTE — Assessment & Plan Note (Signed)
-   management as per orthopedics,  plan to operate   in  a.m.   Keep nothing by mouth post midnight. Patient  not on anticoagulation or antiplatelet agents    Ordered type and screen,  order a vitamin D level Patient currently not on any pain Discussed with wife who states she does not wish for patient to have any medications given unless it is personally discussed with her at the time. If patient is disc having any discomfort nurse to call the wife at which point they can discuss if it is okay to give him any medications Patient does appear to be comfortable at this time Wife who is his MPOA is adamant that no medications to be given to the patient unless they have been previously given personally by Dr. Eulah Pont on upon review of the records only IV fluids have been ordered by Dr. Eulah Pont in the past although patient have had morphine aspirin Ativan Norco and Tylenol in the past and tolerated everything well. If develops any significant pain can try to further readdress with family the patient apparently has tolerated this medications well in the past And should try this time Patient at baseline  able to walk a flight of stairs or 100 feet      Patient, family denies any chest pain or shortness of breath currently and/or with exertion,  ECG ordered  no known history of coronary artery disease,  COPD  Liver failure  CKD  Given advanced age patient is at least moderate  risk   which has been discussed with family but at this point no furthther cardiac workup is indicated.

## 2022-05-29 NOTE — Consult Note (Signed)
   ORTHOPAEDIC CONSULTATION  REQUESTING PHYSICIAN: Rhetta Mura, MD  Chief Complaint: Fall  HPI: Edwin [name] is a 88 y.o. male with history of dementia who suffered a fall yesterday and had difficulty bearing weight. He was brought to the [facility]. He has a history of a right hip hemiarthroplasty with Dr. Renaye Rakers on 07/27/21. He has rehabilited this well, according to his wife he walks a 1/2 mile a day with her. He does not use a walker or cane a baseline. According to wife, he does not have any other medical problems. Patient is demented and does not remember what happened and is denying any pain this morning. No other complaints this morning.   No past medical history on file. Past Surgical History:  Procedure Laterality Date   HIP ARTHROPLASTY Right 07/27/2021   Procedure: ARTHROPLASTY BIPOLAR HIP (HEMIARTHROPLASTY);  Surgeon: Sheral Apley, MD;  Location: [facility] ORS;  Service: Orthopedics;  Laterality: Right;   Social History   Socioeconomic History   Marital status: Married    Spouse name: Not on file   Number of children: Not on file   Years of education: Not on file   Highest education level: Not on file  Occupational History   Not on file  Tobacco Use   Smoking status: Never   Smokeless tobacco: Never  Substance and Sexual Activity   Alcohol use: Not Currently   Drug use: Not Currently   Sexual activity: Not on file  Other Topics Concern   Not on file  Social History Narrative   Not on file   Social Determinants of Health   Financial Resource Strain: Not on file  Food Insecurity: Not on file  Transportation Needs: Not on file  Physical Activity: Not on file  Stress: Not on file  Social Connections: Not on file   Family History  Problem Relation Age of Onset   Dementia Sister    Dementia Brother    Allergies  Allergen Reactions   Beef-Derived Products Other (See Comments)    Requested by wife.   Fish-Derived Products Other (See Comments)     Requested by wife.     Positive ROS: All other systems have been reviewed and were otherwise negative with the exception of those mentioned in the HPI and as above.  Physical Exam: General: Pleasant, alert, no acute distress. Not oriented to person, self, or place.  Cardiovascular: No pedal edema Respiratory: No cyanosis, no use of accessory musculature GI: No organomegaly, abdomen is soft and non-tender Skin: No lesions in the area of chief complaint Neurologic: Sensation intact distally Psychiatric: Not competent for consent. Lymphatic: No axillary or cervical lymphadenopathy  MUSCULOSKELETAL: No TTP to right hip. Leg length appear equal. Dorsiflexion and plantarflexion intact to bilateral ankles. 2+ DP pulse. Patient endorses sensation to feet.  X-rays of right hip show periprosthetic hip fracture around previous right hip hemiarthroplasty.  Assessment: Right periprosthetic hip fracture  Plan: Patient likely to need revision hip replacement surgery. We will work on surgical planning today. Continue to keep NPO today until this plan is in place. NWB RLE in the meantime.     Armida Sans, PA-C    05/29/2022 7:22 AM

## 2022-05-29 NOTE — Progress Notes (Signed)
PROGRESS NOTE   Edwin [name]  QIH:[id number] DOB: 03/29/1934 DOA: 05/28/2022 PCP: [name], DO  Brief Narrative:   88 year old white male known history of dementia Prior mechanical hip replacement status post fall 07/2021 Relatively high functioning despite dementia soft, sundowning Developed a mechanical fall had difficulty bearing weight and was found to have a periprosthetic fracture Orthopedics Dr. Dion Saucier was consulted  Patient transferring from [facility] to [facility] for definitive management and operation on 12/23 under Dr. Eulah Pont who I spoke with  Hospital-Problem based course  Right periprosthetic hip fracture -Definitive management per orthopedics-transferring to [facility] -Wife indicates that he is sensitive to meds need different meds given his dementia  Moderate dementia -Pleasantly confused and looks well  DVT prophylaxis: SCD Code Status: Full Family Communication: sylvia Back called [phone]-extensive conversation with her-she does not want any pain meds given even Tylenol without Dr. Greig Right express instructions-I tried to reason with her but she prefers to reach out to Dr. Eulah Pont himself-we will monitor and patient wife will need to be called if we plan to give any meds for pain Disposition:  Status is: Inpatient Remains inpatient appropriate because:   Needs surgery   Consultants:  Dr Eulah Pont  Procedures: non yet  Antimicrobials: n    Subjective: Pleasantly confused  Objective: Vitals:   05/28/22 2145 05/29/22 0238 05/29/22 0240 05/29/22 0518  BP: 138/86 133/80  125/83  Pulse: 92 96  92  Resp: 20 20  16   Temp: 97.8 F (36.6 C) 98.8 F (37.1 C)  99.6 F (37.6 C)  TempSrc:    Oral  SpO2: 96% 98%  100%  Weight:   66.4 kg    No intake or output data in the 24 hours ending 05/29/22 0737 Filed Weights   05/29/22 0240  Weight: 66.4 kg    Examination:  Awake coherent to person but nothing else no distress Does  not seem to be in pain  Data Reviewed: personally reviewed   CBC    Component Value Date/Time   WBC 9.0 05/29/2022 0316   RBC 4.33 05/29/2022 0316   HGB 12.1 (L) 05/29/2022 0316   HCT 38.4 (L) 05/29/2022 0316   PLT 126 (L) 05/29/2022 0316   MCV 88.7 05/29/2022 0316   MCH 27.9 05/29/2022 0316   MCHC 31.5 05/29/2022 0316   RDW 15.4 05/29/2022 0316   LYMPHSABS 0.8 05/28/2022 2240   MONOABS 0.6 05/28/2022 2240   EOSABS 0.0 05/28/2022 2240   BASOSABS 0.0 05/28/2022 2240      Latest Ref Rng & Units 05/29/2022    3:16 AM 05/28/2022   10:40 PM 08/05/2021    6:02 PM  CMP  Glucose 70 - 99 mg/dL 08/07/2021  875  643   BUN 8 - 23 mg/dL 23  22  19    Creatinine 0.61 - 1.24 mg/dL 329   5.18   Sodium 135 - 145 mmol/L 136  141  141   Potassium 3.5 - 5.1 mmol/L 4.3  4.6  4.3   Chloride 98 - 111 mmol/L 104  106  101   CO2 22 - 32 mmol/L 26  27  30    Calcium 8.9 - 10.3 mg/dL 9.1  9.5  8.41   Total Protein 6.5 - 8.1 g/dL 6.5 - 8.1 g/dL 6.5    7.0   6.9   Total Bilirubin 0.3 - 1.2 mg/dL 0.3 - 1.2 mg/dL 0.7    0.8   0.8   Alkaline Phos 38 - 126  U/L 38 - 126 U/L 57    63   53   AST 15 - 41 U/L 15 - 41 U/L 31    32   39   ALT 0 - 44 U/L 0 - 44 U/L 25    25   38      Radiology Studies: DG Chest Portable 1 View  Result Date: 05/28/2022 CLINICAL DATA:  Hip fracture, preoperative assessment. EXAM: PORTABLE CHEST 1 VIEW COMPARISON:  07/26/2021. FINDINGS: The heart is enlarged and the mediastinal contour is stable. No consolidation, effusion, or pneumothorax. No acute osseous abnormality. IMPRESSION: No active disease. Electronically Signed   By: Thornell Sartorius M.D.   On: 05/28/2022 22:37   DG Hip Unilat W or Wo Pelvis 2-3 Views Right  Result Date: 05/28/2022 CLINICAL DATA:  Insert streak EXAM: DG HIP (WITH OR WITHOUT PELVIS) 2-3V RIGHT COMPARISON:  X-ray right hip 07/27/2021 FINDINGS: Total right hip arthroplasty with acute periprostatic proximal femoral fracture involving the greater  trochanter and proximal shaft. No dislocation of the surgical hardware. Frontal view left hip demonstrates no acute displaced fracture or dislocation. Mild degenerative changes of the left hip. No acute displaced fracture or diastasis of the bones of the pelvis. IMPRESSION: Total right hip arthroplasty with acute periprostatic proximal femoral fracture involving the greater trochanter and proximal shaft. Electronically Signed   By: Tish Frederickson M.D.   On: 05/28/2022 22:11     Scheduled Meds: Continuous Infusions:  sodium chloride 75 mL/hr at 05/29/22 0246     LOS: 1 day   Time spent: 9  Rhetta Mura, MD [facility] To contact the attending provider between 7A-7P or the covering provider during after hours 7P-7A, please log into the web site [email or web address] and access using universal [place] password for that web site. If you do not have the password, please call the hospital operator.  05/29/2022, 7:37 AM

## 2022-05-29 NOTE — Plan of Care (Signed)
  Problem: Pain Managment: Goal: General experience of comfort will improve Outcome: Progressing   

## 2022-05-29 NOTE — H&P (View-Only) (Signed)
      Patient with a prior right hip hemiarthroplasty by Dr. Murphy 10 months ago. Multiple notes for this current episode including the radiologist's read state a total hip arthroplasty however that is incorrect as he does NOT have an acetabular component.   Fell and now has periprosthetic fracture around the stem component.   Plan to transfer patient to [place] and have surgery tomorrow morning 05/30/22 with Dr. Murphy. Will plan on a right hip hemiarthroplasty revision with placement of cerclage wires for fixation around the fractures.  NPO at midnight. Tylenol PRN per Dr. Murphy's request.    Edwin [name] M Abbrielle Batts, PA-C Office [phone] 05/29/2022, 4:01 PM  

## 2022-05-29 NOTE — Progress Notes (Signed)
Patient's wife updated regarding patient's possible transfer to [facility] tonight.

## 2022-05-29 NOTE — Progress Notes (Signed)
      Patient with a prior right hip hemiarthroplasty by Dr. Eulah Pont 10 months ago. Multiple notes for this current episode including the radiologist's read state a total hip arthroplasty however that is incorrect as he does NOT have an acetabular component.   Larey Seat and now has periprosthetic fracture around the stem component.   Plan to transfer patient to [facility] and have surgery tomorrow morning 05/30/22 with Dr. Eulah Pont. Will plan on a right hip hemiarthroplasty revision with placement of cerclage wires for fixation around the fractures.  NPO at midnight. Tylenol PRN per Dr. Greig Right request.    Jenne Pane, PA-C Office [phone] 05/29/2022, 4:01 PM

## 2022-05-30 ENCOUNTER — Other Ambulatory Visit: Payer: Self-pay

## 2022-05-30 ENCOUNTER — Inpatient Hospital Stay (HOSPITAL_COMMUNITY): Payer: Medicare HMO

## 2022-05-30 ENCOUNTER — Inpatient Hospital Stay (HOSPITAL_COMMUNITY): Payer: Medicare HMO | Admitting: Anesthesiology

## 2022-05-30 ENCOUNTER — Encounter (HOSPITAL_COMMUNITY): Payer: Self-pay | Admitting: Internal Medicine

## 2022-05-30 ENCOUNTER — Encounter (HOSPITAL_COMMUNITY)
Admission: EM | Disposition: A | Discharge status: Skilled Nursing Facility | Payer: Self-pay | Source: Home / Self Care | Attending: Family Medicine

## 2022-05-30 DIAGNOSIS — T849XXA Unspecified complication of internal orthopedic prosthetic device, implant and graft, initial encounter: Secondary | ICD-10-CM

## 2022-05-30 DIAGNOSIS — S72001A Fracture of unspecified part of neck of right femur, initial encounter for closed fracture: Secondary | ICD-10-CM | POA: Diagnosis not present

## 2022-05-30 DIAGNOSIS — F0390 Unspecified dementia without behavioral disturbance: Secondary | ICD-10-CM | POA: Diagnosis not present

## 2022-05-30 HISTORY — PX: HIP ARTHROPLASTY: SHX981

## 2022-05-30 LAB — COMPREHENSIVE METABOLIC PANEL
ALT: 24 U/L (ref 0–44)
AST: 30 U/L (ref 15–41)
Albumin: 3.4 g/dL — ABNORMAL LOW (ref 3.5–5.0)
Alkaline Phosphatase: 60 U/L (ref 38–126)
Anion gap: 6 (ref 5–15)
BUN: 21 mg/dL (ref 8–23)
CO2: 27 mmol/L (ref 22–32)
Calcium: 9.2 mg/dL (ref 8.9–10.3)
Chloride: 106 mmol/L (ref 98–111)
Creatinine, Ser: 1.19 mg/dL (ref 0.61–1.24)
GFR, Estimated: 59 mL/min — ABNORMAL LOW (ref >60)
Glucose, Bld: 107 mg/dL — ABNORMAL HIGH (ref 70–99)
Potassium: 4.7 mmol/L (ref 3.5–5.1)
Sodium: 139 mmol/L (ref 135–145)
Total Bilirubin: 1.2 mg/dL (ref 0.3–1.2)
Total Protein: 6.8 g/dL (ref 6.5–8.1)

## 2022-05-30 LAB — TYPE AND SCREEN
ABO/RH(D): O POS
Antibody Screen: NEGATIVE

## 2022-05-30 LAB — CREATININE, SERUM
Creatinine, Ser: 0.98 mg/dL (ref 0.61–1.24)
GFR, Estimated: >60 mL/min (ref >60)

## 2022-05-30 LAB — SURGICAL PCR SCREEN
MRSA, PCR: NEGATIVE
Staphylococcus aureus: NEGATIVE

## 2022-05-30 LAB — CBC
HCT: 36.4 % — ABNORMAL LOW (ref 39.0–52.0)
HCT: 36 % — ABNORMAL LOW (ref 39.0–52.0)
Hemoglobin: 11.9 g/dL — ABNORMAL LOW (ref 13.0–17.0)
Hemoglobin: 11.9 g/dL — ABNORMAL LOW (ref 13.0–17.0)
MCH: 27.8 pg (ref 26.0–34.0)
MCH: 28.1 pg (ref 26.0–34.0)
MCHC: 32.7 g/dL (ref 30.0–36.0)
MCHC: 33.1 g/dL (ref 30.0–36.0)
MCV: 85 fL (ref 80.0–100.0)
MCV: 84.9 fL (ref 80.0–100.0)
Platelets: 122 10*3/uL — ABNORMAL LOW (ref 150–400)
Platelets: 111 10*3/uL — ABNORMAL LOW (ref 150–400)
RBC: 4.28 MIL/uL (ref 4.22–5.81)
RBC: 4.24 MIL/uL (ref 4.22–5.81)
RDW: 14.8 % (ref 11.5–15.5)
RDW: 14.9 % (ref 11.5–15.5)
WBC: 8.3 10*3/uL (ref 4.0–10.5)
WBC: 8.1 10*3/uL (ref 4.0–10.5)
nRBC: 0 % (ref 0.0–0.2)
nRBC: 0 % (ref 0.0–0.2)

## 2022-05-30 SURGERY — HEMIARTHROPLASTY, HIP, DIRECT ANTERIOR APPROACH, FOR FRACTURE
Anesthesia: General | Site: Hip | Laterality: Right

## 2022-05-30 MED ORDER — ACETAMINOPHEN 500 MG PO TABS
1000.0000 mg | ORAL_TABLET | Freq: Once | ORAL | Status: AC
  Administered 2022-05-30: 1000 mg via ORAL

## 2022-05-30 MED ORDER — MIDAZOLAM HCL 2 MG/2ML IJ SOLN
INTRAMUSCULAR | Status: AC
  Filled 2022-05-30: qty 2

## 2022-05-30 MED ORDER — PROPOFOL 10 MG/ML IV BOLUS
INTRAVENOUS | Status: DC | PRN
  Administered 2022-05-30: 80 mg via INTRAVENOUS

## 2022-05-30 MED ORDER — PHENYLEPHRINE 80 MCG/ML (10ML) SYRINGE FOR IV PUSH (FOR BLOOD PRESSURE SUPPORT)
PREFILLED_SYRINGE | INTRAVENOUS | Status: AC
  Filled 2022-05-30: qty 10

## 2022-05-30 MED ORDER — ONDANSETRON HCL 4 MG/2ML IJ SOLN
INTRAMUSCULAR | Status: DC | PRN
  Administered 2022-05-30: 4 mg via INTRAVENOUS

## 2022-05-30 MED ORDER — METHOCARBAMOL 500 MG PO TABS: 500.0000 mg | ORAL_TABLET | Freq: Four times a day (QID) | ORAL | Status: DC | PRN

## 2022-05-30 MED ORDER — CHLORHEXIDINE GLUCONATE 4 % EX LIQD
60.0000 mL | Freq: Once | CUTANEOUS | Status: AC
  Administered 2022-05-30: 4 via TOPICAL
  Filled 2022-05-30: qty 60

## 2022-05-30 MED ORDER — FENTANYL CITRATE (PF) 100 MCG/2ML IJ SOLN: 25.0000 ug | INTRAMUSCULAR | Status: DC | PRN

## 2022-05-30 MED ORDER — CEFAZOLIN SODIUM-DEXTROSE 2-4 GM/100ML-% IV SOLN
2.0000 g | INTRAVENOUS | Status: AC
  Administered 2022-05-30: 2 g via INTRAVENOUS

## 2022-05-30 MED ORDER — METOCLOPRAMIDE HCL 5 MG PO TABS: 5.0000 mg | ORAL_TABLET | Freq: Three times a day (TID) | ORAL | Status: DC | PRN

## 2022-05-30 MED ORDER — ONDANSETRON HCL 4 MG/2ML IJ SOLN
INTRAMUSCULAR | Status: AC
  Filled 2022-05-30: qty 2

## 2022-05-30 MED ORDER — CEFAZOLIN SODIUM-DEXTROSE 2-4 GM/100ML-% IV SOLN
2.0000 g | Freq: Four times a day (QID) | INTRAVENOUS | Status: AC
  Administered 2022-05-30 – 2022-05-31 (×2): 2 g via INTRAVENOUS
  Filled 2022-05-30 (×2): qty 100

## 2022-05-30 MED ORDER — LIDOCAINE 2% (20 MG/ML) 5 ML SYRINGE
INTRAMUSCULAR | Status: DC | PRN
  Administered 2022-05-30: 40 mg via INTRAVENOUS

## 2022-05-30 MED ORDER — FENTANYL CITRATE (PF) 100 MCG/2ML IJ SOLN
INTRAMUSCULAR | Status: DC | PRN
  Administered 2022-05-30: 25 ug via INTRAVENOUS
  Administered 2022-05-30: 75 ug via INTRAVENOUS

## 2022-05-30 MED ORDER — PHENOL 1.4 % MT LIQD: 1.0000 | OROMUCOSAL | Status: DC | PRN

## 2022-05-30 MED ORDER — 0.9 % SODIUM CHLORIDE (POUR BTL) OPTIME
TOPICAL | Status: DC | PRN
  Administered 2022-05-30: 1000 mL

## 2022-05-30 MED ORDER — ENOXAPARIN SODIUM 40 MG/0.4ML IJ SOSY
40.0000 mg | PREFILLED_SYRINGE | INTRAMUSCULAR | Status: DC
  Administered 2022-05-31 – 2022-06-09 (×10): 40 mg via SUBCUTANEOUS
  Filled 2022-05-30 (×10): qty 0.4

## 2022-05-30 MED ORDER — PROPOFOL 10 MG/ML IV BOLUS
INTRAVENOUS | Status: AC
  Filled 2022-05-30: qty 20

## 2022-05-30 MED ORDER — ROCURONIUM BROMIDE 10 MG/ML (PF) SYRINGE
PREFILLED_SYRINGE | INTRAVENOUS | Status: AC
  Filled 2022-05-30: qty 10

## 2022-05-30 MED ORDER — MENTHOL 3 MG MT LOZG: 1.0000 | LOZENGE | OROMUCOSAL | Status: DC | PRN

## 2022-05-30 MED ORDER — HYDROCODONE-ACETAMINOPHEN 5-325 MG PO TABS
1.0000 | ORAL_TABLET | Freq: Four times a day (QID) | ORAL | Status: DC | PRN
  Administered 2022-06-01 – 2022-06-04 (×2): 1 via ORAL
  Filled 2022-05-30 (×2): qty 1

## 2022-05-30 MED ORDER — HYDROCODONE-ACETAMINOPHEN 7.5-325 MG PO TABS: 1.0000 | ORAL_TABLET | Freq: Four times a day (QID) | ORAL | Status: DC | PRN

## 2022-05-30 MED ORDER — SUGAMMADEX SODIUM 200 MG/2ML IV SOLN
INTRAVENOUS | Status: DC | PRN
  Administered 2022-05-30: 200 mg via INTRAVENOUS

## 2022-05-30 MED ORDER — CEFAZOLIN SODIUM-DEXTROSE 2-4 GM/100ML-% IV SOLN
INTRAVENOUS | Status: AC
  Filled 2022-05-30: qty 100

## 2022-05-30 MED ORDER — DEXAMETHASONE SODIUM PHOSPHATE 10 MG/ML IJ SOLN
8.0000 mg | Freq: Once | INTRAMUSCULAR | Status: AC
  Administered 2022-05-30: 8 mg via INTRAVENOUS
  Filled 2022-05-30: qty 1

## 2022-05-30 MED ORDER — POVIDONE-IODINE 10 % EX SWAB
2.0000 | Freq: Once | CUTANEOUS | Status: AC
  Administered 2022-05-30: 2 via TOPICAL

## 2022-05-30 MED ORDER — PHENYLEPHRINE HCL-NACL 20-0.9 MG/250ML-% IV SOLN
INTRAVENOUS | Status: DC | PRN
  Administered 2022-05-30: 40 ug/min via INTRAVENOUS

## 2022-05-30 MED ORDER — TRANEXAMIC ACID-NACL 1000-0.7 MG/100ML-% IV SOLN
1000.0000 mg | Freq: Once | INTRAVENOUS | Status: AC
  Administered 2022-05-30: 1000 mg via INTRAVENOUS
  Filled 2022-05-30: qty 100

## 2022-05-30 MED ORDER — FENTANYL CITRATE (PF) 250 MCG/5ML IJ SOLN
INTRAMUSCULAR | Status: AC
  Filled 2022-05-30: qty 5

## 2022-05-30 MED ORDER — ORAL CARE MOUTH RINSE: 15.0000 mL | Freq: Once | OROMUCOSAL | Status: AC

## 2022-05-30 MED ORDER — CHLORHEXIDINE GLUCONATE 0.12 % MT SOLN: 15.0000 mL | Freq: Once | OROMUCOSAL | Status: AC

## 2022-05-30 MED ORDER — TRANEXAMIC ACID-NACL 1000-0.7 MG/100ML-% IV SOLN
INTRAVENOUS | Status: AC
  Filled 2022-05-30: qty 100

## 2022-05-30 MED ORDER — ROCURONIUM BROMIDE 10 MG/ML (PF) SYRINGE
PREFILLED_SYRINGE | INTRAVENOUS | Status: DC | PRN
  Administered 2022-05-30: 20 mg via INTRAVENOUS
  Administered 2022-05-30: 60 mg via INTRAVENOUS
  Administered 2022-05-30 (×2): 20 mg via INTRAVENOUS

## 2022-05-30 MED ORDER — TRANEXAMIC ACID-NACL 1000-0.7 MG/100ML-% IV SOLN
1000.0000 mg | INTRAVENOUS | Status: AC
  Administered 2022-05-30: 1000 mg via INTRAVENOUS

## 2022-05-30 MED ORDER — DOCUSATE SODIUM 100 MG PO CAPS
100.0000 mg | ORAL_CAPSULE | Freq: Two times a day (BID) | ORAL | Status: DC
  Administered 2022-05-31 – 2022-06-03 (×7): 100 mg via ORAL
  Filled 2022-05-30 (×8): qty 1

## 2022-05-30 MED ORDER — CHLORHEXIDINE GLUCONATE 0.12 % MT SOLN
OROMUCOSAL | Status: AC
  Administered 2022-05-30: 15 mL via OROMUCOSAL
  Filled 2022-05-30: qty 15

## 2022-05-30 MED ORDER — LACTATED RINGERS IV SOLN: INTRAVENOUS | Status: DC

## 2022-05-30 MED ORDER — ONDANSETRON HCL 4 MG/2ML IJ SOLN: 4.0000 mg | Freq: Four times a day (QID) | INTRAMUSCULAR | Status: DC | PRN

## 2022-05-30 MED ORDER — PHENYLEPHRINE 80 MCG/ML (10ML) SYRINGE FOR IV PUSH (FOR BLOOD PRESSURE SUPPORT)
PREFILLED_SYRINGE | INTRAVENOUS | Status: DC | PRN
  Administered 2022-05-30: 80 ug via INTRAVENOUS
  Administered 2022-05-30: 160 ug via INTRAVENOUS
  Administered 2022-05-30: 120 ug via INTRAVENOUS
  Administered 2022-05-30: 80 ug via INTRAVENOUS
  Administered 2022-05-30: 120 ug via INTRAVENOUS

## 2022-05-30 MED ORDER — LIDOCAINE 2% (20 MG/ML) 5 ML SYRINGE
INTRAMUSCULAR | Status: AC
  Filled 2022-05-30: qty 5

## 2022-05-30 MED ORDER — METHOCARBAMOL 1000 MG/10ML IJ SOLN: 500.0000 mg | Freq: Four times a day (QID) | INTRAVENOUS | Status: DC | PRN

## 2022-05-30 MED ORDER — METOCLOPRAMIDE HCL 5 MG/ML IJ SOLN: 5.0000 mg | Freq: Three times a day (TID) | INTRAMUSCULAR | Status: DC | PRN

## 2022-05-30 MED ORDER — ONDANSETRON HCL 4 MG PO TABS: 4.0000 mg | ORAL_TABLET | Freq: Four times a day (QID) | ORAL | Status: DC | PRN

## 2022-05-30 MED ORDER — ACETAMINOPHEN 500 MG PO TABS
ORAL_TABLET | ORAL | Status: AC
  Filled 2022-05-30: qty 2

## 2022-05-30 SURGICAL SUPPLY — 46 items
BAG COUNTER SPONGE SURGICOUNT (BAG) ×1 IMPLANT
BAG SPNG CNTER NS LX DISP (BAG) ×1
BLADE SAW SGTL 73X25 THK (BLADE) ×1 IMPLANT
COVER SURGICAL LIGHT HANDLE (MISCELLANEOUS) ×1 IMPLANT
DRAPE IMP U-DRAPE 54X76 (DRAPES) ×1 IMPLANT
DRAPE INCISE IOBAN 85X60 (DRAPES) ×2 IMPLANT
DRAPE ORTHO SPLIT 77X108 STRL (DRAPES) ×2
DRAPE SURG ORHT 6 SPLT 77X108 (DRAPES) ×2 IMPLANT
DRAPE U-SHAPE 47X51 STRL (DRAPES) ×1 IMPLANT
DRSG ADAPTIC 3X8 NADH LF (GAUZE/BANDAGES/DRESSINGS) IMPLANT
DRSG MEPILEX BORDER 4X8 (GAUZE/BANDAGES/DRESSINGS) ×2 IMPLANT
DRSG MEPILEX POST OP 4X12 (GAUZE/BANDAGES/DRESSINGS) IMPLANT
DRSG TEGADERM 4X4.75 (GAUZE/BANDAGES/DRESSINGS) IMPLANT
DURAPREP 26ML APPLICATOR (WOUND CARE) ×1 IMPLANT
ELECT BLADE 6.5 EXT (BLADE) IMPLANT
ELECT CAUTERY BLADE 6.4 (BLADE) IMPLANT
ELECT REM PT RETURN 9FT ADLT (ELECTROSURGICAL) ×1
ELECTRODE REM PT RTRN 9FT ADLT (ELECTROSURGICAL) ×1 IMPLANT
GAUZE SPONGE 4X4 12PLY STRL (GAUZE/BANDAGES/DRESSINGS) ×1 IMPLANT
GLOVE BIOGEL PI IND STRL 9 (GLOVE) ×1 IMPLANT
GLOVE SURG ORTHO 9.0 STRL STRW (GLOVE) ×1 IMPLANT
GOWN STRL REUS W/ TWL XL LVL3 (GOWN DISPOSABLE) ×1 IMPLANT
GOWN STRL REUS W/TWL XL LVL3 (GOWN DISPOSABLE) ×1
HEAD MODULAR ENDO (Orthopedic Implant) ×1 IMPLANT
HEAD UNPLR 50XMDLR STRL HIP (Orthopedic Implant) IMPLANT
HIP STEM HA 30X155 C-TPR (Hips) ×1 IMPLANT
KIT BASIN OR (CUSTOM PROCEDURE TRAY) ×1 IMPLANT
KIT TURNOVER KIT B (KITS) ×1 IMPLANT
MANIFOLD NEPTUNE II (INSTRUMENTS) ×1 IMPLANT
NS IRRIG 1000ML POUR BTL (IV SOLUTION) ×1 IMPLANT
PACK TOTAL JOINT (CUSTOM PROCEDURE TRAY) ×1 IMPLANT
PACK UNIVERSAL I (CUSTOM PROCEDURE TRAY) ×1 IMPLANT
PAD ARMBOARD 7.5X6 YLW CONV (MISCELLANEOUS) ×2 IMPLANT
SLEEVE CABLE 2MM VT (Orthopedic Implant) IMPLANT
SLEEVE UNITRAX CTAPER (Orthopedic Implant) IMPLANT
STAPLER VISISTAT 35W (STAPLE) ×1 IMPLANT
STEM HIP HA 30X155 C-TPR (Hips) IMPLANT
STRIP CLOSURE SKIN 1/2X4 (GAUZE/BANDAGES/DRESSINGS) IMPLANT
SUT ETHIBOND NAB CT1 #1 30IN (SUTURE) ×1 IMPLANT
SUT MNCRL AB 3-0 PS2 27 (SUTURE) IMPLANT
SUT VIC AB 0 CT1 27 (SUTURE) ×1
SUT VIC AB 0 CT1 27XBRD ANBCTR (SUTURE) IMPLANT
SUT VIC AB 0 CT1 36 (SUTURE) IMPLANT
TOWEL GREEN STERILE (TOWEL DISPOSABLE) ×1 IMPLANT
TOWEL GREEN STERILE FF (TOWEL DISPOSABLE) ×1 IMPLANT
WATER STERILE IRR 1000ML POUR (IV SOLUTION) ×3 IMPLANT

## 2022-05-30 NOTE — Progress Notes (Signed)
Report received from Rupinder, Mickle Plumb.

## 2022-05-30 NOTE — Anesthesia Procedure Notes (Signed)
Procedure Name: Intubation Date/Time: 05/30/2022 11:29 AM  Performed by: Moshe Salisbury, CRNAPre-anesthesia Checklist: Patient identified, Emergency Drugs available, Suction available and Patient being monitored Patient Re-evaluated:Patient Re-evaluated prior to induction Oxygen Delivery Method: Circle System Utilized Preoxygenation: Pre-oxygenation with 100% oxygen Induction Type: IV induction Ventilation: Mask ventilation without difficulty Laryngoscope Size: Mac and 4 Grade View: Grade II Tube type: Oral Tube size: 8.0 mm Number of attempts: 1 Airway Equipment and Method: Stylet Placement Confirmation: ETT inserted through vocal cords under direct vision, positive ETCO2 and breath sounds checked- equal and bilateral Secured at: 22 cm Tube secured with: Tape Dental Injury: Teeth and Oropharynx as per pre-operative assessment

## 2022-05-30 NOTE — Op Note (Signed)
05/28/2022 - 05/30/2022  1:34 PM  PATIENT:  Edwin [name]    PRE-OPERATIVE DIAGNOSIS:  Rip Hip Fx  POST-OPERATIVE DIAGNOSIS:  Same  PROCEDURE:  RIGHT HIP HEMIARTHROPLASTY REVISION  SURGEON:  Sheral Apley, MD  ASSISTANT: Levester Fresh, PA-C, he was present and scrubbed throughout the case, critical for completion in a timely fashion, and for retraction, instrumentation, and closure.   ANESTHESIA:   gen  PREOPERATIVE INDICATIONS:  Edwin [name] is a  88 y.o. male with a diagnosis of Rip Hip Fx who failed conservative measures and elected for surgical management.    The risks benefits and alternatives were discussed with the patient preoperatively including but not limited to the risks of infection, bleeding, nerve injury, cardiopulmonary complications, the need for revision surgery, among others, and the patient was willing to proceed.  OPERATIVE IMPLANTS: stryker rest HA 14x 155 with a 50 head ball  OPERATIVE FINDINGS: unstable stem and fracture  BLOOD LOSS: 300  COMPLICATIONS: none  TOURNIQUET TIME: none  OPERATIVE PROCEDURE:  Patient was identified in the preoperative holding area and site was marked by me He was transported to the operating theater and placed on the table in supine position taking care to pad all bony prominences. After a preincinduction time out anesthesia was induced. The right lower extremity was prepped and draped in normal sterile fashion and a pre-incision timeout was performed. He received ancef for preoperative antibiotics.   He was placed in the lateral position padding all bony prominences with the marked 2 positioner axillary roll was placed.  The right lower extremity was prepped and draped he received Ancef and TXA.  I made an incision through his previous hip incision and carried this slightly distally and dissected down to his IT band which I incised longitudinally.  I then elevated capsule from the back of the femur to expose his hip  joint he had an unstable fracture primarily of his greater trochanter lateral proximal femur.  Next I placed tag stitches in his posterior capsule and was then able to dislocate his hip posteriorly.  I removed his stem which was loose.  I then placed 4 cables around his greater trochanter and femur just below the lesser I was very happy with the fracture reduction and hoop stress this provided  I did take a fluoroscopic image to confirm cable placement 1 elevated cable was on top of the vastus origin superiorly and was still tight so I elected to keep that as is.  Next I sequentially broached up to an 8 stem proximally and then distally to a 14 with a 155 stem.  I then selected the 14 x 155 restoration HA stem and impacted this into place with good stability I placed a -3 head with a 50 head ball.  I was able to reduce this it was very stable and length was appropriate.  I thoroughly irrigated I repaired the posterior capsule to the posterior greater troches  I then closed the IT band and skin in layers sterile dressing was applied he was taken the PACU in stable condition  POST OPERATIVE PLAN: Weightbearing as tolerated mobilizing chemical DVT prophylaxis no hip precautions
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
## 2022-05-30 NOTE — Anesthesia Preprocedure Evaluation (Addendum)
Anesthesia Evaluation  Patient identified by MRN, date of birth, ID band Patient confused    Reviewed: Allergy & Precautions, Patient's Chart, lab work & pertinent test results  Airway Mallampati: I       Dental  (+) Missing, Dental Advisory Given   Pulmonary neg pulmonary ROS   Pulmonary exam normal        Cardiovascular negative cardio ROS  Rhythm:Regular Rate:Normal     Neuro/Psych  PSYCHIATRIC DISORDERS     Dementia negative neurological ROS     GI/Hepatic negative GI ROS, Neg liver ROS,,,  Endo/Other  negative endocrine ROS    Renal/GU negative Renal ROS     Musculoskeletal   Abdominal   Peds  Hematology negative hematology ROS (+)   Anesthesia Other Findings   Reproductive/Obstetrics                             Anesthesia Physical Anesthesia Plan  ASA: 3  Anesthesia Plan: General   Post-op Pain Management:    Induction: Intravenous  PONV Risk Score and Plan: 3 and Ondansetron, Midazolam and Dexamethasone  Airway Management Planned: Oral ETT  Additional Equipment: None  Intra-op Plan:   Post-operative Plan: Extubation in OR  Informed Consent: I have reviewed the patients History and Physical, chart, labs and discussed the procedure including the risks, benefits and alternatives for the proposed anesthesia with the patient or authorized representative who has indicated his/her understanding and acceptance.     Consent reviewed with POA  Plan Discussed with: CRNA  Anesthesia Plan Comments: (Consented with daughter. )       Anesthesia Quick Evaluation

## 2022-05-30 NOTE — Progress Notes (Signed)
PROGRESS NOTE    Edwin [name]  WUX:[id number] DOB: Nov 03, 1933 DOA: 05/28/2022 PCP: [name], DO   Brief Narrative:  88 year old white male known history of dementia Prior mechanical hip replacement status post fall 07/2021 Relatively high functioning despite dementia soft, sundowning Developed a mechanical fall had difficulty bearing weight and was found to have a periprosthetic fracture Orthopedics Dr. Dion Saucier was consulted  Assessment & Plan:   Principal Problem:   Closed right hip fracture (HCC) Active Problems:   Dementia without behavioral disturbance (HCC)  Right periprosthetic hip fracture: Ortho on voard, plan for surgery today, mgmt pere ortho.  Moderate dementia -Pleasantly confused and looks well.  Not on any medications at home.  DVT prophylaxis: Sequential compression device to OR Start: 05/30/22 0648 SCDs Start: 05/29/22 0228   Code Status: Full Code  Family Communication:  None present at bedside.   Status is: Inpatient Remains inpatient appropriate because: Going for surgery today.   Estimated body mass index is 20.42 kg/m as calculated from the following:   Height as of this encounter: 5\' 11"  (1.803 m).   Weight as of this encounter: 66.4 kg.    Nutritional Assessment: Body mass index is 20.42 kg/m. Seen by dietician.  I agree with the assessment and plan as outlined below: Nutrition Status:        . Skin Assessment: I have examined the patient's skin and I agree with the wound assessment as performed by the wound care RN as outlined below:    Consultants:  Orthopedics  Procedures:  As above  Antimicrobials:  Anti-infectives (From admission, onward)    Start     Dose/Rate Route Frequency Ordered Stop   05/30/22 0817  ceFAZolin (ANCEF) 2-4 GM/100ML-% IVPB       Note to Pharmacy: 06/01/22 D: cabinet override      05/30/22 0817 05/30/22 1145   05/30/22 0745  ceFAZolin (ANCEF) IVPB 2g/100 mL premix        2 g 200  mL/hr over 30 Minutes Intravenous On call to O.R. 05/30/22 0647 05/30/22 1135         Subjective: Seen and examined early morning.  Patient alert and oriented to self.  Comfortable.  Objective: Vitals:   05/30/22 0238 05/30/22 0822 05/30/22 0824 05/30/22 1415  BP: 135/84   119/70  Pulse: 88   78  Resp:    15  Temp: 98.9 F (37.2 C)  98.6 F (37 C) (!) 97.5 F (36.4 C)  TempSrc: Oral  Oral   SpO2: 97%   100%  Weight:  66.4 kg    Height:  5\' 11"  (1.803 m)      Intake/Output Summary (Last 24 hours) at 05/30/2022 1431 Last data filed at 05/30/2022 1412 Gross per 24 hour  Intake 1400 ml  Output 900 ml  Net 500 ml   Filed Weights   05/29/22 0240 05/30/22 0822  Weight: 66.4 kg 66.4 kg    Examination:  General exam: Appears calm and comfortable  Respiratory system: Clear to auscultation. Respiratory effort normal. Cardiovascular system: S1 & S2 heard, RRR. No JVD, murmurs, rubs, gallops or clicks. No pedal edema. Gastrointestinal system: Abdomen is nondistended, soft and nontender. No organomegaly or masses felt. Normal bowel sounds heard. Central nervous system: Alert and oriented x 1. No focal neurological deficits.   Data Reviewed: I have personally reviewed following labs and imaging studies  CBC: Recent Labs  Lab 05/28/22 2240 05/29/22 0316 05/30/22 0302  WBC 7.1 9.0 8.3  NEUTROABS  5.6  --   --   HGB 12.6* 12.1* 11.9*  HCT 38.4* 38.4* 36.4*  MCV 85.1 88.7 85.0  PLT 159 126* 122*   Basic Metabolic Panel: Recent Labs  Lab 05/28/22 2240 05/29/22 0316 05/30/22 0302  NA 141 136 139  K 4.6 4.3 4.7  CL 106 104 106  CO2 27 26 27   GLUCOSE 101* 111* 107*  BUN 22 23 21   CREATININE 0.93 1.12 1.19  CALCIUM 9.5 9.1 9.2  MG  --  2.4  --   PHOS  --  2.7  --    GFR: Estimated Creatinine Clearance: 41.8 mL/min (by C-G formula based on SCr of 1.19 mg/dL). Liver Function Tests: Recent Labs  Lab 05/29/22 0316 05/30/22 0302  AST 32  31 30  ALT 25  25 24    ALKPHOS 63  57 60  BILITOT 0.8  0.7 1.2  PROT 7.0  6.5 6.8  ALBUMIN 3.6  3.1* 3.4*   No results for input(s): "LIPASE", "AMYLASE" in the last 168 hours. No results for input(s): "AMMONIA" in the last 168 hours. Coagulation Profile: Recent Labs  Lab 05/28/22 2240  INR 1.3*   Cardiac Enzymes: Recent Labs  Lab 05/29/22 0316  CKTOTAL 165   BNP (last 3 results) No results for input(s): "PROBNP" in the last 8760 hours. HbA1C: No results for input(s): "HGBA1C" in the last 72 hours. CBG: No results for input(s): "GLUCAP" in the last 168 hours. Lipid Profile: No results for input(s): "CHOL", "HDL", "LDLCALC", "TRIG", "CHOLHDL", "LDLDIRECT" in the last 72 hours. Thyroid Function Tests: No results for input(s): "TSH", "T4TOTAL", "FREET4", "T3FREE", "THYROIDAB" in the last 72 hours. Anemia Panel: No results for input(s): "VITAMINB12", "FOLATE", "FERRITIN", "TIBC", "IRON", "RETICCTPCT" in the last 72 hours. Sepsis Labs: No results for input(s): "PROCALCITON", "LATICACIDVEN" in the last 168 hours.  Recent Results (from the past 240 hour(s))  Surgical pcr screen     Status: None   Collection Time: 05/30/22  4:18 AM   Specimen: Nasal Mucosa; Nasal Swab  Result Value Ref Range Status   MRSA, PCR NEGATIVE NEGATIVE Final   Staphylococcus aureus NEGATIVE NEGATIVE Final    Comment: (NOTE) The Xpert SA Assay (FDA approved for NASAL specimens in patients 41 years of age and older), is one component of a comprehensive surveillance program. It is not intended to diagnose infection nor to guide or monitor treatment. Performed at [facility], [address], [place], [facility] [address]      Radiology Studies: DG Chest Portable 1 View  Result Date: 05/28/2022 CLINICAL DATA:  Hip fracture, preoperative assessment. EXAM: PORTABLE CHEST 1 VIEW COMPARISON:  07/26/2021. FINDINGS: The heart is enlarged and the mediastinal contour is stable. No consolidation, effusion, or  pneumothorax. No acute osseous abnormality. IMPRESSION: No active disease. Electronically Signed   By: [id number] M.D.   On: 05/28/2022 22:37   DG Hip Unilat W or Wo Pelvis 2-3 Views Right  Result Date: 05/28/2022 CLINICAL DATA:  Insert streak EXAM: DG HIP (WITH OR WITHOUT PELVIS) 2-3V RIGHT COMPARISON:  X-ray right hip 07/27/2021 FINDINGS: Total right hip arthroplasty with acute periprostatic proximal femoral fracture involving the greater trochanter and proximal shaft. No dislocation of the surgical hardware. Frontal view left hip demonstrates no acute displaced fracture or dislocation. Mild degenerative changes of the left hip. No acute displaced fracture or diastasis of the bones of the pelvis. IMPRESSION: Total right hip arthroplasty with acute periprostatic proximal femoral fracture involving the greater trochanter and proximal  shaft. Electronically Signed   By: Tish Frederickson M.D.   On: 05/28/2022 22:11    Scheduled Meds:  acetaminophen       Continuous Infusions:  sodium chloride     lactated ringers 10 mL/hr at 05/30/22 1344     LOS: 2 days   Hughie Closs, MD [facility]  05/30/2022, 2:31 PM   *Please note that this is a verbal dictation therefore any spelling or grammatical errors are due to the "Dragon [employer] One" system interpretation.  Please page via Amion and do not message via secure chat for urgent patient care matters. Secure chat can be used for non urgent patient care matters.  How to contact the [facility] Attending or Consulting provider 7A - 7P or covering provider during after hours 7P -7A, for this patient?  Check the care team in [facility] and look for a) attending/consulting [facility] provider listed and b) the [facility] team listed. Page or secure chat 7A-7P. Log into [email or web address] and use [place]'s universal password to access. If you do not have the password, please contact the hospital operator. Locate the [facility] provider you are looking for under [facility]  and page to a number that you can be directly reached. If you still have difficulty reaching the provider, please page the [facility] (Director on Call) for the Hospitalists listed on amion for assistance.

## 2022-05-30 NOTE — Discharge Instructions (Signed)

## 2022-05-30 NOTE — Interval H&P Note (Signed)
History and Physical Interval Note:  05/30/2022 7:36 AM  Edwin [name]  has presented today for surgery, with the diagnosis of Rip Hip Fx.  The various methods of treatment have been discussed with the patient and family. After consideration of risks, benefits and other options for treatment, the patient has consented to  Procedure(s): RIGHT HIP REVISION (Right) as a surgical intervention.  The patient's history has been reviewed, patient examined, no change in status, stable for surgery.  I have reviewed the patient's chart and labs.  Questions were answered to the patient's satisfaction.     Sheral Apley

## 2022-05-30 NOTE — Transfer of Care (Signed)
Immediate Anesthesia Transfer of Care Note  Patient: Vencent Hauschild  Procedure(s) Performed: RIGHT HIP HEMIARTHROPLASTY REVISION (Right: Hip)  Patient Location: PACU  Anesthesia Type:General  Level of Consciousness: responds to stimulation  Airway & Oxygen Therapy: Patient Spontanous Breathing and Patient connected to nasal cannula oxygen  Post-op Assessment: Report given to RN, Post -op Vital signs reviewed and stable, and Patient moving all extremities  Post vital signs: Reviewed and stable  Last Vitals:  Vitals Value Taken Time  BP 119/70 05/30/22 1415  Temp    Pulse 74 05/30/22 1420  Resp 15 05/30/22 1420  SpO2 100 % 05/30/22 1420  Vitals shown include unvalidated device data.  Last Pain:  Vitals:   05/30/22 0824  TempSrc: Oral  PainSc:          Complications: No notable events documented.

## 2022-05-31 DIAGNOSIS — S72001A Fracture of unspecified part of neck of right femur, initial encounter for closed fracture: Secondary | ICD-10-CM | POA: Diagnosis not present

## 2022-05-31 LAB — COMPREHENSIVE METABOLIC PANEL
ALT: 18 U/L (ref 0–44)
AST: 36 U/L (ref 15–41)
Albumin: 2.8 g/dL — ABNORMAL LOW (ref 3.5–5.0)
Alkaline Phosphatase: 47 U/L (ref 38–126)
Anion gap: 5 (ref 5–15)
BUN: 21 mg/dL (ref 8–23)
CO2: 25 mmol/L (ref 22–32)
Calcium: 8.6 mg/dL — ABNORMAL LOW (ref 8.9–10.3)
Chloride: 108 mmol/L (ref 98–111)
Creatinine, Ser: 1.23 mg/dL (ref 0.61–1.24)
GFR, Estimated: 57 mL/min — ABNORMAL LOW (ref >60)
Glucose, Bld: 109 mg/dL — ABNORMAL HIGH (ref 70–99)
Potassium: 4.2 mmol/L (ref 3.5–5.1)
Sodium: 138 mmol/L (ref 135–145)
Total Bilirubin: 0.7 mg/dL (ref 0.3–1.2)
Total Protein: 5.7 g/dL — ABNORMAL LOW (ref 6.5–8.1)

## 2022-05-31 LAB — CBC
HCT: 28.7 % — ABNORMAL LOW (ref 39.0–52.0)
Hemoglobin: 9.9 g/dL — ABNORMAL LOW (ref 13.0–17.0)
MCH: 28.9 pg (ref 26.0–34.0)
MCHC: 34.5 g/dL (ref 30.0–36.0)
MCV: 83.9 fL (ref 80.0–100.0)
Platelets: 119 10*3/uL — ABNORMAL LOW (ref 150–400)
RBC: 3.42 MIL/uL — ABNORMAL LOW (ref 4.22–5.81)
RDW: 14.9 % (ref 11.5–15.5)
WBC: 12.6 10*3/uL — ABNORMAL HIGH (ref 4.0–10.5)
nRBC: 0 % (ref 0.0–0.2)

## 2022-05-31 NOTE — Plan of Care (Signed)
  Problem: Elimination: Goal: Will not experience complications related to bowel motility Outcome: Not Progressing   Problem: Nutrition: Goal: Adequate nutrition will be maintained Outcome: Not Progressing   Problem: Pain Managment: Goal: General experience of comfort will improve Outcome: Not Progressing   Problem: Safety: Goal: Ability to remain free from injury will improve Outcome: Not Progressing

## 2022-05-31 NOTE — Progress Notes (Signed)
Patient uncooperative for blood extraction, morning labs rescheduled for later extraction per phlebotomist.

## 2022-05-31 NOTE — Progress Notes (Signed)
   ORTHOPAEDIC PROGRESS NOTE  s/p Procedure(s): RIGHT HIP HEMIARTHROPLASTY REVISION on 05/30/2022 with Dr. Eulah Pont   SUBJECTIVE: Pleasantly confused. Does not complain of any pain.   OBJECTIVE: PE: General: sitting up in hospital bed, NAD RLE: dressing with bloody drainage on the distal aspect of the bandage, dressing removed, incision with no active drainage currently, new aquacel placed, tender to palpation right hip, knee immobilizer in place, he wiggles his toes slightly but has difficulty following commands, warm well perfused foot   Vitals:   05/30/22 2356 05/31/22 0419  BP: 94/67 107/84  Pulse: 93 96  Resp:  18  Temp:  98.4 F (36.9 C)  SpO2: 99% 98%    ASSESSMENT: Edwin [name] is a 88 y.o. male POD#1   PLAN: Weightbearing: WBAT RLE Insicional and dressing care: Reinforce dressings as needed. New dressing placed today.  Orthopedic device(s): None Showering: Hold for now VTE prophylaxis: Lovenox 40 mg Pain control: PRN pain medications, minimize narcotics as able  Follow - up plan: 2 weeks in office with Dr. Eulah Pont Dispo: TBD. PT/OT evals today Contact information: After hours and holidays please check [email or web address] for group call information for [facility]  Alfonse Alpers, PA-C 05/31/2022

## 2022-05-31 NOTE — Evaluation (Addendum)
Physical Therapy Evaluation Patient Details Name: Edwin [name] MRN: [id number] DOB: 02/12/1934 Today's Date: 05/31/2022  History of Present Illness  Pt is an 88 y.o. male admitted 05/28/22 after mechanical fall sustaining R periprosthetic hip fx. S/p R hip hemiarthroplasty revision 12/23. PMH includes dementia, R hip hemiarthroplasty (07/2021).   Clinical Impression  Pt presents with an overall decrease in functional mobility secondary to above. Today, pt pleasantly confused with mumbling speech difficult to understand at times, restless. Pt requiring totalA for bed mobility, actively resisting attempts to sit EOB; will require +2 assist or lift equipment for OOB. Pt unable to maintain posterior hip precautions, frequently crossing legs during session with no awareness, requiring pillow to maintain hip abduction. Pt tolerated some RLE PROM. Pt would benefit from SNF-level therapies to maximize functional mobility and decrease caregiver burden. Will follow acutely to address established goals.      Recommendations for follow up therapy are one component of a multi-disciplinary discharge planning process, led by the attending physician.  Recommendations may be updated based on patient status, additional functional criteria and insurance authorization.  Follow Up Recommendations Skilled nursing-short term rehab (<3 hours/day) Can patient physically be transported by private vehicle: No    Assistance Recommended at Discharge Frequent or constant Supervision/Assistance  Patient can return home with the following  Two people to help with walking and/or transfers;Two people to help with bathing/dressing/bathroom;Assistance with feeding    Equipment Recommendations  (defer to next venue)  Recommendations for Other Services       Functional Status Assessment Patient has had a recent decline in their functional status and demonstrates the ability to make significant improvements in function in a  reasonable and predictable amount of time.     Precautions / Restrictions Precautions Precautions: Fall;Posterior Hip Precaution Booklet Issued: No Restrictions Weight Bearing Restrictions: Yes RLE Weight Bearing: Weight bearing as tolerated      Mobility  Bed Mobility Overal bed mobility: Needs Assistance Bed Mobility: Supine to Sit     Supine to sit: Total assist     General bed mobility comments: attempted to sit EOB, pt requiring totalA for BLE management, actively resisting attempts to elevate trunk, ultimately requiring totalA for return to supine and scooting up in bed    Transfers                   General transfer comment: will require +2 assist and likely lift equipment    Ambulation/Gait                  Stairs            Wheelchair Mobility    Modified Rankin (Stroke Patients Only)       Balance                                             Pertinent Vitals/Pain Pain Assessment Pain Assessment: Faces Faces Pain Scale: Hurts little more Pain Location: RLE with PROM Pain Descriptors / Indicators: Grimacing, Guarding Pain Intervention(s): Monitored during session, Repositioned    Home Living Family/patient expects to be discharged to:: Skilled nursing facility Living Arrangements: Spouse/significant other                      Prior Function Prior Level of Function : Needs assist;Patient poor historian/Family not available  Hand Dominance        Extremity/Trunk Assessment   Upper Extremity Assessment Upper Extremity Assessment: Generalized weakness    Lower Extremity Assessment Lower Extremity Assessment: RLE deficits/detail;Generalized weakness RLE Deficits / Details: pt with minimal RLE activation noted, good tolerance for knee flex/ext PROM, poor tolerance for hip ABD/flex (limited ROM suspect pt resisting/guarding against movement combined with muscular  stiffness)       Communication   Communication: Expressive difficulties (mumbling speech difficult to understand at times)  Cognition Arousal/Alertness: Awake/alert Behavior During Therapy: Restless Overall Cognitive Status: History of cognitive impairments - at baseline                                 General Comments: h/o alzheimer's dementia (per chart, A&Ox2 baseline). today, pt would not tell me his name, but responding to "Xion", unable to state wife's name. pt confused, difficult to redirect for mobility participation, actively resisting movement and not following simple commands. pt restless attempting to remove soft mitts entirety of session. responding appropriately when presented with water and applesauce (feeder)        General Comments General comments (skin integrity, edema, etc.): pt unable to comprehend posterior hip precautions; R KI doffed since saturated with urine (RN notified), pt repeatedly crossing LLE over RLE with KI on and off, therefore placed pillow between legs to prevent RLE ADD/IR. pt dependent for drinking water and eating applesauce; RN notified pt's dinner tray present, pt will need assist to eat    Exercises Other Exercises Other Exercises: gentle PROM hip flex (<90'), abd (very limited), knee flex/ext - pt inconsistently resisting movement or becoming irritated by it   Assessment/Plan    PT Assessment Patient needs continued PT services  PT Problem List Decreased strength;Decreased range of motion;Decreased activity tolerance;Decreased balance;Decreased mobility;Decreased coordination;Decreased cognition;Decreased knowledge of use of DME;Decreased safety awareness;Decreased knowledge of precautions;Pain       PT Treatment Interventions DME instruction;Gait training;Functional mobility training;Therapeutic activities;Therapeutic exercise;Balance training;Cognitive remediation;Patient/family education;Wheelchair mobility training    PT  Goals (Current goals can be found in the Care Plan section)  Acute Rehab PT Goals PT Goal Formulation: Patient unable to participate in goal setting Time For Goal Achievement: 06/14/22 Potential to Achieve Goals: Fair    Frequency Min 3X/week     Co-evaluation               AM-PAC PT "6 Clicks" Mobility  Outcome Measure Help needed turning from your back to your side while in a flat bed without using bedrails?: Total Help needed moving from lying on your back to sitting on the side of a flat bed without using bedrails?: Total Help needed moving to and from a bed to a chair (including a wheelchair)?: Total Help needed standing up from a chair using your arms (e.g., wheelchair or bedside chair)?: Total Help needed to walk in hospital room?: Total Help needed climbing 3-5 steps with a railing? : Total 6 Click Score: 6    End of Session   Activity Tolerance: Patient limited by pain;Other (comment) (limited by cognition) Patient left: in bed;with call bell/phone within reach;with bed alarm set Nurse Communication: Mobility status PT Visit Diagnosis: Other abnormalities of gait and mobility (R26.89);Muscle weakness (generalized) (M62.81);Pain Pain - Right/Left: Right Pain - part of body: Leg    Time: 6222-9798 PT Time Calculation (min) (ACUTE ONLY): 25 min   Charges:   PT Evaluation $PT Eval Moderate Complexity:  1 Mod        Ina Homes, PT, DPT Acute Rehabilitation Services  Personal: Secure Chat Rehab Office: [phone]  Malachy Chamber 05/31/2022, 4:47 PM

## 2022-05-31 NOTE — Progress Notes (Signed)
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 Dion Saucier was consulted  Assessment & Plan:   Principal Problem:   Closed right hip fracture [facility]) Active Problems:   Dementia without behavioral disturbance (HCC)  Right periprosthetic hip fracture: This post right hip hemiarthroplasty revision on 05/30/2022 by Dr. Eulah Pont.  Patient doing well.  Management per orthopedics.  PT OT to assess.  Moderate dementia -Pleasantly confused and looks well.  Not on any medications at home.  DVT prophylaxis: enoxaparin (LOVENOX) injection 40 mg Start: 05/31/22 0800 SCDs Start: 05/30/22 1515 SCDs Start: 05/29/22 0228   Code Status: Full Code  Family Communication:  None present at bedside.   Status is: Inpatient Remains inpatient appropriate because: Needs PT OT assessment.   Estimated body mass index is 20.42 kg/m as calculated from the following:   Height as of this encounter: 5\' 11"  (1.803 m).   Weight as of this encounter: 66.4 kg.    Nutritional Assessment: Body mass index is 20.42 kg/m. Seen by dietician.  I agree with the assessment and plan as outlined below: Nutrition Status:        . Skin Assessment: I have examined the patient's skin and I agree with the wound assessment as performed by the wound care RN as outlined below:    Consultants:  Orthopedics  Procedures:  As above  Antimicrobials:  Anti-infectives (From admission, onward)    Start     Dose/Rate Route Frequency Ordered Stop   05/30/22 1700  ceFAZolin (ANCEF) IVPB 2g/100 mL premix        2 g 200 mL/hr over 30 Minutes Intravenous Every  6 hours 05/30/22 1514 05/31/22 0242   05/30/22 0817  ceFAZolin (ANCEF) 2-4 GM/100ML-% IVPB       Note to Pharmacy: 06/01/22 D: cabinet override      05/30/22 0817 05/30/22 1145   05/30/22 0745  ceFAZolin (ANCEF) IVPB 2g/100 mL premix        2 g 200 mL/hr over 30 Minutes Intravenous On call to O.R. 05/30/22 0647 05/30/22 1135         Subjective:  Patient seen and examined.  Patient pleasantly confused, oriented to self only.  Has hand mittens.  Objective: Vitals:   05/30/22 1540 05/30/22 2036 05/30/22 2356 05/31/22 0419  BP: 115/82 105/77 94/67 107/84  Pulse: 95 100 93 96  Resp: 16   18  Temp:  99.1 F (37.3 C)  98.4 F (36.9 C)  TempSrc:  Axillary  Axillary  SpO2: 98% 93% 99% 98%  Weight:      Height:        Intake/Output Summary (Last 24 hours) at 05/31/2022 1046 Last data filed at 05/31/2022 0423 Gross per 24 hour  Intake 2090.25 ml  Output 750 ml  Net 1340.25 ml    Filed Weights   05/29/22 0240 05/30/22 0822  Weight: 66.4 kg 66.4 kg    Examination:  General exam: Appears calm and comfortable  Respiratory system: Clear to auscultation. Respiratory effort normal. Cardiovascular system: S1 & S2 heard, RRR. No JVD, murmurs, rubs, gallops or clicks. No pedal edema. Gastrointestinal system: Abdomen is nondistended, soft and nontender. No organomegaly or masses felt. Normal  bowel sounds heard. Central nervous system: Alert and oriented x 1. No focal neurological deficits. Extremities: Symmetric 5 x 5 power. Skin: No rashes, lesions or ulcers.    Data Reviewed: I have personally reviewed following labs and imaging studies  CBC: Recent Labs  Lab 05/28/22 2240 05/29/22 0316 05/30/22 0302 05/30/22 1529  WBC 7.1 9.0 8.3 8.1  NEUTROABS 5.6  --   --   --   HGB 12.6* 12.1* 11.9* 11.9*  HCT 38.4* 38.4* 36.4* 36.0*  MCV 85.1 88.7 85.0 84.9  PLT 159 126* 122* 111*    Basic Metabolic Panel: Recent Labs  Lab 05/28/22 2240 05/29/22 0316 05/30/22 0302  05/30/22 1529  NA 141 136 139  --   K 4.6 4.3 4.7  --   CL 106 104 106  --   CO2 27 26 27   --   GLUCOSE 101* 111* 107*  --   BUN 22 23 21   --   CREATININE 0.93 1.12 1.19 0.98  CALCIUM 9.5 9.1 9.2  --   MG  --  2.4  --   --   PHOS  --  2.7  --   --     GFR: Estimated Creatinine Clearance: 50.8 mL/min (by C-G formula based on SCr of 0.98 mg/dL). Liver Function Tests: Recent Labs  Lab 05/29/22 0316 05/30/22 0302  AST 32  31 30  ALT 25  25 24   ALKPHOS 63  57 60  BILITOT 0.8  0.7 1.2  PROT 7.0  6.5 6.8  ALBUMIN 3.6  3.1* 3.4*    No results for input(s): "LIPASE", "AMYLASE" in the last 168 hours. No results for input(s): "AMMONIA" in the last 168 hours. Coagulation Profile: Recent Labs  Lab 05/28/22 2240  INR 1.3*    Cardiac Enzymes: Recent Labs  Lab 05/29/22 0316  CKTOTAL 165    BNP (last 3 results) No results for input(s): "PROBNP" in the last 8760 hours. HbA1C: No results for input(s): "HGBA1C" in the last 72 hours. CBG: No results for input(s): "GLUCAP" in the last 168 hours. Lipid Profile: No results for input(s): "CHOL", "HDL", "LDLCALC", "TRIG", "CHOLHDL", "LDLDIRECT" in the last 72 hours. Thyroid Function Tests: No results for input(s): "TSH", "T4TOTAL", "FREET4", "T3FREE", "THYROIDAB" in the last 72 hours. Anemia Panel: No results for input(s): "VITAMINB12", "FOLATE", "FERRITIN", "TIBC", "IRON", "RETICCTPCT" in the last 72 hours. Sepsis Labs: No results for input(s): "PROCALCITON", "LATICACIDVEN" in the last 168 hours.  Recent Results (from the past 240 hour(s))  Surgical pcr screen     Status: None   Collection Time: 05/30/22  4:18 AM   Specimen: Nasal Mucosa; Nasal Swab  Result Value Ref Range Status   MRSA, PCR NEGATIVE NEGATIVE Final   Staphylococcus aureus NEGATIVE NEGATIVE Final    Comment: (NOTE) The Xpert SA Assay (FDA approved for NASAL specimens in patients 11 years of age and older), is one component of a  comprehensive surveillance program. It is not intended to diagnose infection nor to guide or monitor treatment. Performed at [facility], [address], [place], [facility] [address]      Radiology Studies: Pelvis Portable  Result Date: 05/30/2022 CLINICAL DATA:  History of right hip hemiarthroplasty EXAM: PORTABLE PELVIS 1-2 VIEWS COMPARISON:  Radiograph 05/28/2022 FINDINGS: Postoperative images during right hip arthroplasty revision with cerclage wire fixation of the proximal femur for a periprosthetic fracture. Improved fracture alignment. Hardware is intact without evidence of immediate complication. Expected soft tissue changes. IMPRESSION: Postsurgical changes of right hip  arthroplasty revision and cerclage wire fixation of the proximal femur for a periprosthetic fracture. Improved fracture alignment. No evidence of immediate complication. Electronically Signed   By: Caprice Renshaw M.D.   On: 05/30/2022 15:45   DG HIP UNILAT WITH PELVIS 1V RIGHT  Result Date: 05/30/2022 CLINICAL DATA:  Fluoroscopic assistance for internal fixation EXAM: DG HIP (WITH OR WITHOUT PELVIS) 1V RIGHT COMPARISON:  Previous studies including the examination done on 05/28/2022 FINDINGS: There is interval removal of arthroplasty hardware. There is interval reduction and internal fixation of displaced fracture of proximal right femur with multiple metallic wires. Fluoroscopic time 1.7 seconds. Radiation dose 0.8 mGy. IMPRESSION: Fluoroscopic assistance was provided for internal fixation of displaced fracture of proximal right femur. There is interval removal of right arthroplasty hardware. Electronically Signed   By: Ernie Avena M.D.   On: 05/30/2022 14:42   DG C-Arm 1-60 Min-No Report  Result Date: 05/30/2022 Fluoroscopy was utilized by the requesting physician.  No radiographic interpretation.    Scheduled Meds:  docusate sodium  100 mg Oral BID   enoxaparin (LOVENOX) injection  40 mg Subcutaneous  Q24H   Continuous Infusions:  sodium chloride 50 mL/hr at 05/30/22 2345   methocarbamol (ROBAXIN) IV       LOS: 3 days   Hughie Closs, MD [facility]  05/31/2022, 10:46 AM   *Please note that this is a verbal dictation therefore any spelling or grammatical errors are due to the "[employer]" system interpretation.  Please page via Amion and do not message via secure chat for urgent patient care matters. Secure chat can be used for non urgent patient care matters.  How to contact the [facility] Attending or Consulting provider 7A - 7P or covering provider during after hours 7P -7A, for this patient?  Check the care team in [facility] and look for a) attending/consulting TRH provider listed and b) the [facility] team listed. Page or secure chat 7A-7P. Log into [email or web address] and use [facility]'s universal password to access. If you do not have the password, please contact the hospital operator. Locate the [facility] provider you are looking for under [facility] and page to a number that you can be directly reached. If you still have difficulty reaching the provider, please page the [facility] (Director on Call) for the Hospitalists listed on amion for assistance.

## 2022-06-01 DIAGNOSIS — D649 Anemia, unspecified: Secondary | ICD-10-CM | POA: Diagnosis present

## 2022-06-01 DIAGNOSIS — I959 Hypotension, unspecified: Secondary | ICD-10-CM | POA: Diagnosis present

## 2022-06-01 DIAGNOSIS — S72001A Fracture of unspecified part of neck of right femur, initial encounter for closed fracture: Secondary | ICD-10-CM | POA: Diagnosis not present

## 2022-06-01 LAB — CBC
HCT: 27.3 % — ABNORMAL LOW (ref 39.0–52.0)
Hemoglobin: 9.3 g/dL — ABNORMAL LOW (ref 13.0–17.0)
MCH: 28.3 pg (ref 26.0–34.0)
MCHC: 34.1 g/dL (ref 30.0–36.0)
MCV: 83 fL (ref 80.0–100.0)
Platelets: UNDETERMINED 10*3/uL (ref 150–400)
RBC: 3.29 MIL/uL — ABNORMAL LOW (ref 4.22–5.81)
RDW: 14.6 % (ref 11.5–15.5)
WBC: 9.5 10*3/uL (ref 4.0–10.5)
nRBC: 0 % (ref 0.0–0.2)

## 2022-06-01 LAB — COMPREHENSIVE METABOLIC PANEL
ALT: 15 U/L (ref 0–44)
AST: 40 U/L (ref 15–41)
Albumin: 2.6 g/dL — ABNORMAL LOW (ref 3.5–5.0)
Alkaline Phosphatase: 40 U/L (ref 38–126)
Anion gap: 7 (ref 5–15)
BUN: 18 mg/dL (ref 8–23)
CO2: 24 mmol/L (ref 22–32)
Calcium: 8.6 mg/dL — ABNORMAL LOW (ref 8.9–10.3)
Chloride: 108 mmol/L (ref 98–111)
Creatinine, Ser: 1.28 mg/dL — ABNORMAL HIGH (ref 0.61–1.24)
GFR, Estimated: 55 mL/min — ABNORMAL LOW (ref >60)
Glucose, Bld: 90 mg/dL (ref 70–99)
Potassium: 4.2 mmol/L (ref 3.5–5.1)
Sodium: 139 mmol/L (ref 135–145)
Total Bilirubin: 0.8 mg/dL (ref 0.3–1.2)
Total Protein: 5.4 g/dL — ABNORMAL LOW (ref 6.5–8.1)

## 2022-06-01 LAB — LACTIC ACID, PLASMA
Lactic Acid, Venous: 0.8 mmol/L (ref 0.5–1.9)
Lactic Acid, Venous: 1.1 mmol/L (ref 0.5–1.9)

## 2022-06-01 MED ORDER — SODIUM CHLORIDE 0.9 % IV BOLUS
1000.0000 mL | Freq: Once | INTRAVENOUS | Status: AC
  Administered 2022-06-01: 1000 mL via INTRAVENOUS

## 2022-06-01 NOTE — Plan of Care (Signed)

## 2022-06-01 NOTE — Progress Notes (Signed)
PROGRESS NOTE    Edwin [name]  TKW:[id number] DOB: 1933-09-24 DOA: 05/28/2022 PCP: [name], DO   Brief Narrative:  88 year old white male known history of dementia Prior mechanical hip replacement status post fall 07/2021 Relatively high functioning despite dementia soft, sundowning Developed a mechanical fall had difficulty bearing weight and was found to have a periprosthetic fracture Orthopedics Dr. Dion Saucier was consulted  Assessment & Plan:   Principal Problem:   Closed right hip fracture (HCC) Active Problems:   Dementia without behavioral disturbance (HCC)   Hypotension   Chronic anemia  Right periprosthetic hip fracture: This post right hip hemiarthroplasty revision on 05/30/2022 by Dr. Eulah Pont.  Patient doing well.  Management per orthopedics.  PT OT to assess.  Moderate dementia -Pleasantly confused and looks well.  Not on any medications at home.  Low-grade postop fever with hypotension: Had low-grade fever of 100.5 around 3:30 PM and blood pressure this morning was 74/46.  Patient is already getting IV fluids but we will give him IV fluid bolus of 1 L over 2 hours since patient is slightly sleepy and lethargic which could be due to low blood pressure and has slightly elevated creatinine as well.  Will check lactic acid.  No leukocytosis.  Chronic anemia: Stable.  DVT prophylaxis: enoxaparin (LOVENOX) injection 40 mg Start: 05/31/22 0800 SCDs Start: 05/30/22 1515 SCDs Start: 05/29/22 0228   Code Status: Full Code  Family Communication:  None present at bedside.   Status is: Inpatient Remains inpatient appropriate because: Needs PT OT assessment.   Estimated body mass index is 20.42 kg/m as calculated from the following:   Height as of this encounter: 5\' 11"  (1.803 m).   Weight as of this encounter: 66.4 kg.    Nutritional Assessment: Body mass index is 20.42 kg/m. Seen by dietician.  I agree with the assessment and plan as outlined  below: Nutrition Status:  Skin Assessment: I have examined the patient's skin and I agree with the wound assessment as performed by the wound care RN as outlined below:    Consultants:  Orthopedics  Procedures:  As above  Antimicrobials:  Anti-infectives (From admission, onward)    Start     Dose/Rate Route Frequency Ordered Stop   05/30/22 1700  ceFAZolin (ANCEF) IVPB 2g/100 mL premix        2 g 200 mL/hr over 30 Minutes Intravenous Every 6 hours 05/30/22 1514 05/31/22 0242   05/30/22 0817  ceFAZolin (ANCEF) 2-4 GM/100ML-% IVPB       Note to Pharmacy: 06/01/22 D: cabinet override      05/30/22 0817 05/30/22 1145   05/30/22 0745  ceFAZolin (ANCEF) IVPB 2g/100 mL premix        2 g 200 mL/hr over 30 Minutes Intravenous On call to O.R. 05/30/22 0647 05/30/22 1135         Subjective:  Patient seen and examined.  He is slightly lethargic but arousable.  Likely due to low blood pressure.  Remains confused.  Objective: Vitals:   06/01/22 0333 06/01/22 0833 06/01/22 0900 06/01/22 0937  BP: 109/66 (!) 84/50 (!) 74/46 (!) 92/56  Pulse: 90 75 78 74  Resp: 18   16  Temp: (!) 100.5 F (38.1 C) 98.4 F (36.9 C)  98.2 F (36.8 C)  TempSrc: Axillary Oral    SpO2: 96% 100%    Weight:      Height:        Intake/Output Summary (Last 24 hours) at 06/01/2022 1112 Last data  filed at 06/01/2022 0500 Gross per 24 hour  Intake 500 ml  Output 1250 ml  Net -750 ml   Filed Weights   05/29/22 0240 05/30/22 0822  Weight: 66.4 kg 66.4 kg    Examination:  General exam: Appears lethargic. Respiratory system: Clear to auscultation. Respiratory effort normal. Cardiovascular system: S1 & S2 heard, RRR. No JVD, murmurs, rubs, gallops or clicks. No pedal edema. Gastrointestinal system: Abdomen is nondistended, soft and nontender. No organomegaly or masses felt. Normal bowel sounds heard. Central nervous system: Lethargic and confused. Extremities: Symmetric 5 x 5 power. Skin:  No rashes, lesions or ulcers.     Data Reviewed: I have personally reviewed following labs and imaging studies  CBC: Recent Labs  Lab 05/28/22 2240 05/29/22 0316 05/30/22 0302 05/30/22 1529 05/31/22 1012 06/01/22 0255  WBC 7.1 9.0 8.3 8.1 12.6* 9.5  NEUTROABS 5.6  --   --   --   --   --   HGB 12.6* 12.1* 11.9* 11.9* 9.9* 9.3*  HCT 38.4* 38.4* 36.4* 36.0* 28.7* 27.3*  MCV 85.1 88.7 85.0 84.9 83.9 83.0  PLT 159 126* 122* 111* 119* PLATELET CLUMPS NOTED ON SMEAR, UNABLE TO ESTIMATE   Basic Metabolic Panel: Recent Labs  Lab 05/28/22 2240 05/29/22 0316 05/30/22 0302 05/30/22 1529 05/31/22 1012 06/01/22 0255  NA 141 136 139  --  138 139  K 4.6 4.3 4.7  --  4.2 4.2  CL 106 104 106  --  108 108  CO2 27 26 27   --  25 24  GLUCOSE 101* 111* 107*  --  109* 90  BUN 22 23 21   --  21 18  CREATININE 0.93 1.12 1.19 0.98 1.23 1.28*  CALCIUM 9.5 9.1 9.2  --  8.6* 8.6*  MG  --  2.4  --   --   --   --   PHOS  --  2.7  --   --   --   --    GFR: Estimated Creatinine Clearance: 38.9 mL/min (A) (by C-G formula based on SCr of 1.28 mg/dL (H)). Liver Function Tests: Recent Labs  Lab 05/29/22 0316 05/30/22 0302 05/31/22 1012 06/01/22 0255  AST 32  31 30 36 40  ALT 25  25 24 18 15   ALKPHOS 63  57 60 47 40  BILITOT 0.8  0.7 1.2 0.7 0.8  PROT 7.0  6.5 6.8 5.7* 5.4*  ALBUMIN 3.6  3.1* 3.4* 2.8* 2.6*   No results for input(s): "LIPASE", "AMYLASE" in the last 168 hours. No results for input(s): "AMMONIA" in the last 168 hours. Coagulation Profile: Recent Labs  Lab 05/28/22 2240  INR 1.3*   Cardiac Enzymes: Recent Labs  Lab 05/29/22 0316  CKTOTAL 165   BNP (last 3 results) No results for input(s): "PROBNP" in the last 8760 hours. HbA1C: No results for input(s): "HGBA1C" in the last 72 hours. CBG: No results for input(s): "GLUCAP" in the last 168 hours. Lipid Profile: No results for input(s): "CHOL", "HDL", "LDLCALC", "TRIG", "CHOLHDL", "LDLDIRECT" in the last 72  hours. Thyroid Function Tests: No results for input(s): "TSH", "T4TOTAL", "FREET4", "T3FREE", "THYROIDAB" in the last 72 hours. Anemia Panel: No results for input(s): "VITAMINB12", "FOLATE", "FERRITIN", "TIBC", "IRON", "RETICCTPCT" in the last 72 hours. Sepsis Labs: No results for input(s): "PROCALCITON", "LATICACIDVEN" in the last 168 hours.  Recent Results (from the past 240 hour(s))  Surgical pcr screen     Status: None   Collection Time: 05/30/22  4:18 AM   Specimen:  Nasal Mucosa; Nasal Swab  Result Value Ref Range Status   MRSA, PCR NEGATIVE NEGATIVE Final   Staphylococcus aureus NEGATIVE NEGATIVE Final    Comment: (NOTE) The Xpert SA Assay (FDA approved for NASAL specimens in patients 43 years of age and older), is one component of a comprehensive surveillance program. It is not intended to diagnose infection nor to guide or monitor treatment. Performed at [facility], [place] [address], [place], [place] [address]      Radiology Studies: Pelvis Portable  Result Date: 05/30/2022 CLINICAL DATA:  History of right hip hemiarthroplasty EXAM: PORTABLE PELVIS 1-2 VIEWS COMPARISON:  Radiograph 05/28/2022 FINDINGS: Postoperative images during right hip arthroplasty revision with cerclage wire fixation of the proximal femur for a periprosthetic fracture. Improved fracture alignment. Hardware is intact without evidence of immediate complication. Expected soft tissue changes. IMPRESSION: Postsurgical changes of right hip arthroplasty revision and cerclage wire fixation of the proximal femur for a periprosthetic fracture. Improved fracture alignment. No evidence of immediate complication. Electronically Signed   By: Maurine Simmering M.D.   On: 05/30/2022 15:45   DG HIP UNILAT WITH PELVIS 1V RIGHT  Result Date: 05/30/2022 CLINICAL DATA:  Fluoroscopic assistance for internal fixation EXAM: DG HIP (WITH OR WITHOUT PELVIS) 1V RIGHT COMPARISON:  Previous studies including the examination done on  05/28/2022 FINDINGS: There is interval removal of arthroplasty hardware. There is interval reduction and internal fixation of displaced fracture of proximal right femur with multiple metallic wires. Fluoroscopic time 1.7 seconds. Radiation dose 0.8 mGy. IMPRESSION: Fluoroscopic assistance was provided for internal fixation of displaced fracture of proximal right femur. There is interval removal of right arthroplasty hardware. Electronically Signed   By: Elmer Picker M.D.   On: 05/30/2022 14:42   DG C-Arm 1-60 Min-No Report  Result Date: 05/30/2022 Fluoroscopy was utilized by the requesting physician.  No radiographic interpretation.    Scheduled Meds:  docusate sodium  100 mg Oral BID   enoxaparin (LOVENOX) injection  40 mg Subcutaneous Q24H   Continuous Infusions:  sodium chloride 50 mL/hr at 06/01/22 [id number]   methocarbamol (ROBAXIN) IV       LOS: 4 days   Darliss Cheney, MD [facility]  06/01/2022, 11:12 AM   *Please note that this is a verbal dictation therefore any spelling or grammatical errors are due to the "[employer] [employer]" system interpretation.  Please page via [place] and do not message via secure chat for urgent patient care matters. Secure chat can be used for non urgent patient care matters.  How to contact the [facility] Attending or Consulting provider [place] or covering provider during after hours [place], for this patient?  Check the care team in [facility] and look for a) attending/consulting TRH provider listed and b) the [facility] team listed. Page or secure chat 7A-7P. Log into [email or web address] and use [facility]'s universal password to access. If you do not have the password, please contact the hospital operator. Locate the [facility] provider you are looking for under [facility] and page to a number that you can be directly reached. If you still have difficulty reaching the provider, please page the [facility] (Director on Call) for the Hospitalists listed on amion for  assistance.

## 2022-06-01 NOTE — Progress Notes (Signed)
   ORTHOPAEDIC PROGRESS NOTE  s/p Procedure(s): RIGHT HIP HEMIARTHROPLASTY REVISION on 05/30/2022 with Dr. Eulah Pont   SUBJECTIVE: Patient with very mumbled speech today. He does not respond to questions.   OBJECTIVE: PE: RLE: aquacel dressing CDI, tender to palpation right hip, he wiggles his toes slightly but has difficulty following commands, warm well perfused foot   Vitals:   06/01/22 0333 06/01/22 0833  BP: 109/66 (!) 84/50  Pulse: 90 75  Resp: 18   Temp: (!) 100.5 F (38.1 C) 98.4 F (36.9 C)  SpO2: 96% 100%    ASSESSMENT: Edwin [name] is a 88 y.o. male POD#2   PLAN: Weightbearing: WBAT RLE Insicional and dressing care: Reinforce dressings as needed.   Orthopedic device(s): None Showering: Hold for now VTE prophylaxis: Lovenox 40 mg Pain control: PRN pain medications, minimize narcotics as able  Follow - up plan: 2 weeks in office with Dr. Eulah Pont Dispo: TBD. PT/OT recommending SNF Contact information: After hours and holidays please check [email or web address] for group call information for [facility]  Alfonse Alpers, PA-C 06/01/2022

## 2022-06-02 ENCOUNTER — Encounter (HOSPITAL_COMMUNITY): Payer: Self-pay | Admitting: Orthopedic Surgery

## 2022-06-02 DIAGNOSIS — S72001A Fracture of unspecified part of neck of right femur, initial encounter for closed fracture: Secondary | ICD-10-CM | POA: Diagnosis not present

## 2022-06-02 LAB — CBC WITH DIFFERENTIAL/PLATELET
Abs Immature Granulocytes: 0.05 10*3/uL (ref 0.00–0.07)
Basophils Absolute: 0 10*3/uL (ref 0.0–0.1)
Basophils Relative: 0 %
Eosinophils Absolute: 0.2 10*3/uL (ref 0.0–0.5)
Eosinophils Relative: 2 %
HCT: 25.4 % — ABNORMAL LOW (ref 39.0–52.0)
Hemoglobin: 8.4 g/dL — ABNORMAL LOW (ref 13.0–17.0)
Immature Granulocytes: 1 %
Lymphocytes Relative: 8 %
Lymphs Abs: 0.6 10*3/uL — ABNORMAL LOW (ref 0.7–4.0)
MCH: 28.2 pg (ref 26.0–34.0)
MCHC: 33.1 g/dL (ref 30.0–36.0)
MCV: 85.2 fL (ref 80.0–100.0)
Monocytes Absolute: 0.7 10*3/uL (ref 0.1–1.0)
Monocytes Relative: 10 %
Neutro Abs: 5.7 10*3/uL (ref 1.7–7.7)
Neutrophils Relative %: 79 %
Platelets: 144 10*3/uL — ABNORMAL LOW (ref 150–400)
RBC: 2.98 MIL/uL — ABNORMAL LOW (ref 4.22–5.81)
RDW: 15 % (ref 11.5–15.5)
WBC: 7.2 10*3/uL (ref 4.0–10.5)
nRBC: 0 % (ref 0.0–0.2)

## 2022-06-02 LAB — BASIC METABOLIC PANEL
Anion gap: 7 (ref 5–15)
BUN: 14 mg/dL (ref 8–23)
CO2: 23 mmol/L (ref 22–32)
Calcium: 8.2 mg/dL — ABNORMAL LOW (ref 8.9–10.3)
Chloride: 111 mmol/L (ref 98–111)
Creatinine, Ser: 0.92 mg/dL (ref 0.61–1.24)
GFR, Estimated: >60 mL/min (ref >60)
Glucose, Bld: 121 mg/dL — ABNORMAL HIGH (ref 70–99)
Potassium: 3.8 mmol/L (ref 3.5–5.1)
Sodium: 141 mmol/L (ref 135–145)

## 2022-06-02 MED ORDER — HYDROCODONE-ACETAMINOPHEN 5-325 MG PO TABS: 1.0000 | ORAL_TABLET | Freq: Four times a day (QID) | ORAL | 0 refills | Status: DC | PRN

## 2022-06-02 MED ORDER — ENOXAPARIN SODIUM 40 MG/0.4ML IJ SOSY: 40.0000 mg | PREFILLED_SYRINGE | INTRAMUSCULAR | 0 refills | Status: DC

## 2022-06-02 NOTE — NC FL2 (Signed)
[place] MEDICAID FL2 LEVEL OF CARE FORM     IDENTIFICATION  Patient Name: Edwin [name] Birthdate: 1934/06/02 Sex: male Admission Date (Current Location): 05/28/2022  [facility] and [place] Number:  Producer, television/film/video and Address:  [facility] [place], [address], [place], [place] [address]      Provider Number: [id number]  Attending Physician Name and Address:  Hughie Closs, MD  Relative Name and Phone Number:  Zaidan, Keeble [phone]  [phone]    Current Level of Care: Hospital Recommended Level of Care: Skilled Nursing Facility Prior Approval Number:    Date Approved/Denied:   PASRR Number: [id number]  Discharge Plan: SNF    Current Diagnoses: Patient Active Problem List   Diagnosis Date Noted   Hypotension 06/01/2022   Chronic anemia 06/01/2022   Malnutrition of moderate degree 07/29/2021   Closed right hip fracture (HCC) 07/26/2021   Dementia without behavioral disturbance (HCC) 07/26/2021    Orientation RESPIRATION BLADDER Height & Weight     Self  Normal Incontinent, External catheter Weight: 146 lb 6.2 oz (66.4 kg) Height:  5\' 11"  (180.3 cm)  BEHAVIORAL SYMPTOMS/MOOD NEUROLOGICAL BOWEL NUTRITION STATUS      Incontinent Diet (see discharge summary)  AMBULATORY STATUS COMMUNICATION OF NEEDS Skin   Total Care Verbally Surgical wounds                       Personal Care Assistance Level of Assistance  Bathing, Feeding, Dressing Bathing Assistance: Maximum assistance Feeding assistance: Maximum assistance Dressing Assistance: Maximum assistance     Functional Limitations Info  Sight, Hearing, Speech Sight Info: Adequate Hearing Info: Adequate Speech Info: Adequate    SPECIAL CARE FACTORS FREQUENCY  PT (By licensed PT), OT (By licensed OT)     PT Frequency: 5x week OT Frequency: 5x week            Contractures Contractures Info: Not present    Additional Factors Info  Code Status, Allergies  Code Status Info: full Allergies Info: Milk-related Compounds, Beef-derived Products, Other           Current Medications (06/02/2022):  This is the current hospital active medication list Current Facility-Administered Medications  Medication Dose Route Frequency Provider Last Rate Last Admin   0.9 %  sodium chloride infusion   Intravenous Continuous 06/04/2022, PA-C 50 mL/hr at 06/01/22 0826 New Bag at 06/01/22 0826   acetaminophen (TYLENOL) tablet 1,000 mg  1,000 mg Oral Q6H PRN 06/03/22, PA-C   1,000 mg at 06/01/22 2239   docusate sodium (COLACE) capsule 100 mg  100 mg Oral BID 2240 M, PA-C   100 mg at 06/02/22 0814   enoxaparin (LOVENOX) injection 40 mg  40 mg Subcutaneous Q24H 06/04/22 M, PA-C   40 mg at 06/02/22 06/04/22   HYDROcodone-acetaminophen (NORCO) 7.5-325 MG per tablet 1 tablet  1 tablet Oral Q6H PRN 10-06-1976 M, PA-C       HYDROcodone-acetaminophen (NORCO/VICODIN) 5-325 MG per tablet 1 tablet  1 tablet Oral Q6H PRN M, PA-C   1 tablet at 06/01/22 2239   menthol-cetylpyridinium (CEPACOL) lozenge 3 mg  1 lozenge Oral PRN 2240 M, PA-C       Or   phenol (CHLORASEPTIC) mouth spray 1 spray  1 spray Mouth/Throat PRN Gawne, Meghan M, PA-C       methocarbamol (ROBAXIN) tablet 500 mg  500 mg Oral Q6H PRN M, PA-C  Or   methocarbamol (ROBAXIN) 500 mg in dextrose 5 % 50 mL IVPB  500 mg Intravenous Q6H PRN Gawne, Meghan M, PA-C       metoCLOPramide (REGLAN) tablet 5-10 mg  5-10 mg Oral Q8H PRN Gawne, Meghan M, PA-C       Or   metoCLOPramide (REGLAN) injection 5-10 mg  5-10 mg Intravenous Q8H PRN Gawne, Meghan M, PA-C       ondansetron (ZOFRAN) tablet 4 mg  4 mg Oral Q6H PRN Gawne, Meghan M, PA-C       Or   ondansetron (ZOFRAN) injection 4 mg  4 mg Intravenous Q6H PRN Jenne Pane, PA-C         Discharge Medications: Please see discharge summary for a list of discharge medications.  Relevant Imaging  Results:  Relevant Lab Results:   Additional Information SS# [id number]. Pt is not vaccinated for covid.  Lorri Frederick, LCSW

## 2022-06-02 NOTE — Care Management Important Message (Signed)
Important Message  Patient Details  Name: Edwin [name] MRN: [id number] Date of Birth: 1934/01/14   Medicare Important Message Given:  Yes     Lacrystal Barbe Stefan [place] 06/02/2022, 3:36 PM

## 2022-06-02 NOTE — TOC Initial Note (Signed)
Transition of Care [facility]) - Initial/Assessment Note    Patient Details  Name: Edwin [name] MRN: [id number] Date of Birth: 1934/01/03  Transition of Care [facility]) CM/SW Contact:    Lorri Frederick, LCSW Phone Number: 06/02/2022, 1:23 PM  Clinical Narrative:    Pt oriented x1, CSW spoke with wife [name] regarding DC recommendation for SNF.  Wife is agreeable to this, does not want [facility].  Pt lives at home with wife, no current services.  Pt is not vaccinated for covid.  Referral sent out in hub for SNF.               Expected Discharge Plan: Skilled Nursing Facility Barriers to Discharge: Continued Medical Work up, SNF Pending bed offer   Patient Goals and CMS Choice   CMS Medicare.gov Compare Post Acute Care list provided to:: Patient Represenative (must comment) (wife [name]) Choice offered to / list presented to : Spouse      Expected Discharge Plan and Services In-house Referral: Clinical Social Work   Post Acute Care Choice: Skilled Nursing Facility Living arrangements for the past 2 months: Single Family Home                                      Prior Living Arrangements/Services Living arrangements for the past 2 months: Single Family Home Lives with:: Spouse Patient language and need for interpreter reviewed:: Yes        Need for Family Participation in Patient Care: Yes (Comment) Care giver support system in place?: Yes (comment) Current home services: Other (comment) (none) Criminal Activity/Legal Involvement Pertinent to Current Situation/Hospitalization: No - Comment as needed  Activities of Daily Living   ADL Screening (condition at time of admission) Patient's cognitive ability adequate to safely complete daily activities?: No Is the patient deaf or have difficulty hearing?: No Does the patient have difficulty seeing, even when wearing glasses/contacts?: No Does the patient have difficulty concentrating, remembering, or making  decisions?: Yes Patient able to express need for assistance with ADLs?: No Does the patient have difficulty dressing or bathing?: Yes Independently performs ADLs?: No Communication: Needs assistance Is this a change from baseline?: Pre-admission baseline Dressing (OT): Needs assistance Is this a change from baseline?: Pre-admission baseline Grooming: Needs assistance Is this a change from baseline?: Pre-admission baseline Feeding: Independent Bathing: Needs assistance Is this a change from baseline?: Pre-admission baseline Toileting: Needs assistance Is this a change from baseline?: Pre-admission baseline In/Out Bed: Needs assistance Is this a change from baseline?: Pre-admission baseline Walks in Home: Needs assistance Is this a change from baseline?: Change from baseline, expected to last >3 days Does the patient have difficulty walking or climbing stairs?: Yes Weakness of Legs: Both Weakness of Arms/Hands: None  Permission Sought/Granted                  Emotional Assessment Appearance:: Appears stated age Attitude/Demeanor/Rapport: Engaged Affect (typically observed): Pleasant Orientation: : Oriented to Self      Admission diagnosis:  Closed right hip fracture (HCC) [S72.001A] Closed fracture of right hip, initial encounter (HCC) [S72.001A] Patient Active Problem List   Diagnosis Date Noted   Hypotension 06/01/2022   Chronic anemia 06/01/2022   Malnutrition of moderate degree 07/29/2021   Closed right hip fracture (HCC) 07/26/2021   Dementia without behavioral disturbance (HCC) 07/26/2021   PCP:  [name], DO Pharmacy:  No Pharmacies Listed  Social Determinants of Health (SDOH) Social History: SDOH Screenings   Tobacco Use: Low Risk  (05/30/2022)   SDOH Interventions:     Readmission Risk Interventions     No data to display

## 2022-06-02 NOTE — Progress Notes (Signed)
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 Dion Saucier was consulted  Assessment & Plan:   Principal Problem:   Closed right hip fracture (HCC) Active Problems:   Dementia without behavioral disturbance (HCC)   Hypotension   Chronic anemia  Right periprosthetic hip fracture: This post right hip hemiarthroplasty revision on 05/30/2022 by Dr. Eulah Pont.  Patient doing well.  Management per orthopedics.  PT OT recommended SNF.  TOC consulted.  Waiting for placement.  Moderate dementia -Pleasantly confused and looks well.  Not on any medications at home.  Low-grade postop fever with hypotension: Had low-grade fever of 100.5 around 3:30 AM on 06/01/2022 and blood pressure was 74/46, lactic acid normal, he was given IV fluids, blood pressure much better today.  He has remained afebrile since then.  Remains on gentle IV hydration.  Acute blood loss on chronic anemia: Baseline globin appears to be between 9 and 11, dropped to 8.4 likely secondary to surgery but no indication of transfusion.  Monitor daily.  DVT prophylaxis: enoxaparin (LOVENOX) injection 40 mg Start: 05/31/22 0800 SCDs Start: 05/30/22 1515 SCDs Start: 05/29/22 0228   Code Status: Full Code  Family Communication:  None present at bedside.   Status is: Inpatient Remains inpatient appropriate because: Awaiting placement   Estimated body mass index is 20.42 kg/m as calculated from the following:   Height as of this encounter: 5\' 11"  (1.803 m).   Weight as of this encounter: 66.4 kg.    Nutritional Assessment: Body mass index is 20.42 kg/m. Seen by  dietician.  I agree with the assessment and plan as outlined below: Nutrition Status:  Skin Assessment: I have examined the patient's skin and I agree with the wound assessment as performed by the wound care RN as outlined below:    Consultants:  Orthopedics  Procedures:  As above  Antimicrobials:  Anti-infectives (From admission, onward)    Start     Dose/Rate Route Frequency Ordered Stop   05/30/22 1700  ceFAZolin (ANCEF) IVPB 2g/100 mL premix        2 g 200 mL/hr over 30 Minutes Intravenous Every 6 hours 05/30/22 1514 05/31/22 0242   05/30/22 0817  ceFAZolin (ANCEF) 2-4 GM/100ML-% IVPB       Note to Pharmacy: 06/01/22 D: cabinet override      05/30/22 0817 05/30/22 1145   05/30/22 0745  ceFAZolin (ANCEF) IVPB 2g/100 mL premix        2 g 200 mL/hr over 30 Minutes Intravenous On call to O.R. 05/30/22 0647 05/30/22 1135         Subjective:  Seen and examined.  Awake and oriented to self at his baseline.  Objective: Vitals:   06/01/22 0937 06/01/22 1100 06/01/22 2002 06/02/22 0731  BP: (!) 92/56 118/71 105/65 121/70  Pulse: 74  94 (!) 51  Resp: 16  14 18   Temp: 98.2 F (36.8 C)  99.3 F (37.4 C) 98.9 F (37.2 C)  TempSrc:   Oral Oral  SpO2:   100%   Weight:      Height:       No intake or output data in the 24 hours ending 06/02/22 0941   05/29/22 0240 05/30/22 0822  Weight: 66.4 kg 66.4 kg    Examination:  General exam: Appears calm and comfortable  Respiratory system: Clear to auscultation. Respiratory effort normal. Cardiovascular system: S1 & S2 heard, RRR. No JVD, murmurs, rubs, gallops or clicks. No pedal edema. Gastrointestinal system: Abdomen is nondistended, soft and nontender. No organomegaly or masses felt. Normal bowel sounds heard. Central nervous system: Alert and oriented x 1. No focal neurological deficits. Extremities: Symmetric 5 x 5 power. Skin: No rashes, lesions or ulcers.    Data Reviewed: I have  personally reviewed following labs and imaging studies  CBC: Recent Labs  Lab 05/28/22 2240 05/29/22 0316 05/30/22 0302 05/30/22 1529 05/31/22 1012 06/01/22 0255  WBC 7.1 9.0 8.3 8.1 12.6* 9.5  NEUTROABS 5.6  --   --   --   --   --   HGB 12.6* 12.1* 11.9* 11.9* 9.9* 9.3*  HCT 38.4* 38.4* 36.4* 36.0* 28.7* 27.3*  MCV 85.1 88.7 85.0 84.9 83.9 83.0  PLT 159 126* 122* 111* 119* PLATELET CLUMPS NOTED ON SMEAR, UNABLE TO ESTIMATE    Basic Metabolic Panel: Recent Labs  Lab 05/28/22 2240 05/29/22 0316 05/30/22 0302 05/30/22 1529 05/31/22 1012 06/01/22 0255  NA 141 136 139  --  138 139  K 4.6 4.3 4.7  --  4.2 4.2  CL 106 104 106  --  108 108  CO2 27 26 27   --  25 24  GLUCOSE 101* 111* 107*  --  109* 90  BUN 22 23 21   --  21 18  CREATININE 0.93 1.12 1.19 0.98 1.23 1.28*  CALCIUM 9.5 9.1 9.2  --  8.6* 8.6*  MG  --  2.4  --   --   --   --   PHOS  --  2.7  --   --   --   --     GFR: Estimated Creatinine Clearance: 38.9 mL/min (A) (by C-G formula based on SCr of 1.28 mg/dL (H)). Liver Function Tests: Recent Labs  Lab 05/29/22 0316 05/30/22 0302 05/31/22 1012 06/01/22 0255  AST 32  31 30 36 40  ALT 25  25 24 18 15   ALKPHOS 63  57 60 47 40  BILITOT 0.8  0.7 1.2 0.7 0.8  PROT 7.0  6.5 6.8 5.7* 5.4*  ALBUMIN 3.6  3.1* 3.4* 2.8* 2.6*    No results for input(s): "LIPASE", "AMYLASE" in the last 168 hours. No results for input(s): "AMMONIA" in the last 168 hours. Coagulation Profile: Recent Labs  Lab 05/28/22 2240  INR 1.3*    Cardiac Enzymes: Recent Labs  Lab 05/29/22 0316  CKTOTAL 165    BNP (last 3 results) No results for input(s): "PROBNP" in the last 8760 hours. HbA1C: No results for input(s): "HGBA1C" in the last 72 hours. CBG: No results for input(s): "GLUCAP" in the last 168 hours. Lipid Profile: No results for input(s): "CHOL", "HDL", "LDLCALC", "TRIG", "CHOLHDL", "LDLDIRECT" in the last 72 hours. Thyroid Function Tests: No results for  input(s): "TSH", "T4TOTAL", "FREET4", "T3FREE", "THYROIDAB" in the last 72 hours. Anemia Panel: No results for input(s): "VITAMINB12", "FOLATE", "FERRITIN", "TIBC", "IRON", "RETICCTPCT" in the last 72 hours. Sepsis Labs: Recent Labs  Lab 06/01/22 1135 06/01/22 1449  LATICACIDVEN 0.8 1.1    Recent Results (from the past 240 hour(s))  Surgical pcr screen     Status: None   Collection Time: 05/30/22  4:18 AM   Specimen: Nasal Mucosa; Nasal Swab  Result Value Ref Range Status  MRSA, PCR NEGATIVE NEGATIVE Final   Staphylococcus aureus NEGATIVE NEGATIVE Final    Comment: (NOTE) The Xpert SA Assay (FDA approved for NASAL specimens in patients 50 years of age and older), is one component of a comprehensive surveillance program. It is not intended to diagnose infection nor to guide or monitor treatment. Performed at [facility], [address] [place], [place] [address]      Radiology Studies: No results found.  Scheduled Meds:  docusate sodium  100 mg Oral BID   enoxaparin (LOVENOX) injection  40 mg Subcutaneous Q24H   Continuous Infusions:  sodium chloride 50 mL/hr at 06/01/22 0093   methocarbamol (ROBAXIN) IV       LOS: 5 days   Hughie Closs, MD [facility]  06/02/2022, 9:41 AM   *Please note that this is a verbal dictation therefore any spelling or grammatical errors are due to the "Dragon Medical One" system interpretation.  Please page via Amion and do not message via secure chat for urgent patient care matters. Secure chat can be used for non urgent patient care matters.  How to contact the [facility] Attending or Consulting provider 7A - 7P or covering provider during after hours 7P -7A, for this patient?  Check the care team in [facility] and look for a) attending/consulting TRH provider listed and b) the [facility] team listed. Page or secure chat 7A-7P. Log into [email or web address] and use [place]'s universal password to access. If you do not have the password, please  contact the hospital operator. Locate the [facility] provider you are looking for under [facility] and page to a number that you can be directly reached. If you still have difficulty reaching the provider, please page the [facility] (Director on Call) for the Hospitalists listed on amion for assistance.

## 2022-06-03 DIAGNOSIS — S72001A Fracture of unspecified part of neck of right femur, initial encounter for closed fracture: Secondary | ICD-10-CM | POA: Diagnosis not present

## 2022-06-03 LAB — CBC WITH DIFFERENTIAL/PLATELET
Abs Immature Granulocytes: 0.04 10*3/uL (ref 0.00–0.07)
Basophils Absolute: 0 10*3/uL (ref 0.0–0.1)
Basophils Relative: 0 %
Eosinophils Absolute: 0.3 10*3/uL (ref 0.0–0.5)
Eosinophils Relative: 4 %
HCT: 24.4 % — ABNORMAL LOW (ref 39.0–52.0)
Hemoglobin: 8.3 g/dL — ABNORMAL LOW (ref 13.0–17.0)
Immature Granulocytes: 1 %
Lymphocytes Relative: 14 %
Lymphs Abs: 1 10*3/uL (ref 0.7–4.0)
MCH: 28.7 pg (ref 26.0–34.0)
MCHC: 34 g/dL (ref 30.0–36.0)
MCV: 84.4 fL (ref 80.0–100.0)
Monocytes Absolute: 0.8 10*3/uL (ref 0.1–1.0)
Monocytes Relative: 11 %
Neutro Abs: 5 10*3/uL (ref 1.7–7.7)
Neutrophils Relative %: 70 %
Platelets: 146 10*3/uL — ABNORMAL LOW (ref 150–400)
RBC: 2.89 MIL/uL — ABNORMAL LOW (ref 4.22–5.81)
RDW: 14.8 % (ref 11.5–15.5)
WBC: 7.1 10*3/uL (ref 4.0–10.5)
nRBC: 0.3 % — ABNORMAL HIGH (ref 0.0–0.2)

## 2022-06-03 MED ORDER — DOCUSATE SODIUM 50 MG/5ML PO LIQD
100.0000 mg | Freq: Two times a day (BID) | ORAL | Status: DC
  Administered 2022-06-03 – 2022-06-09 (×8): 100 mg via ORAL
  Filled 2022-06-03 (×9): qty 10

## 2022-06-03 NOTE — Progress Notes (Signed)
Physical Therapy Treatment Patient Details Name: Edwin [name] MRN: [id number] DOB: 08/30/1933 Today's Date: 06/03/2022   History of Present Illness Pt is an 88 y.o. male admitted 05/28/22 after mechanical fall sustaining R periprosthetic hip fx. S/p R hip hemiarthroplasty revision 12/23. PMH includes dementia, R hip hemiarthroplasty (07/2021).    PT Comments    Pt received in supine, alert/confused and with poor command following, RN clearance for session (spouse not present in room). Pt with poor command following and non-sensical speech, needing totalA +2 to transfer from supine<>EOB and actively resisting LE ROM and repositioning. Pt tolerated sitting up ~2 mins but needing max to totalA +2 for seated balance so returned to supine for pt safety. Pt IV coming out of L arm and RN aware, returning to replace it after session, mitts replaced for pt safety due to restlessness/confusion. Pt continues to benefit from PT services to progress toward functional mobility goals.  Messaged PA C McBane, as per op note from Dr Eulah Pont, no hip precautions post-op, PA reports this is correct, updated below for clarity and continuity of care.   Recommendations for follow up therapy are one component of a multi-disciplinary discharge planning process, led by the attending physician.  Recommendations may be updated based on patient status, additional functional criteria and insurance authorization.  Follow Up Recommendations  Skilled nursing-short term rehab (<3 hours/day) Can patient physically be transported by private vehicle: No   Assistance Recommended at Discharge Frequent or constant Supervision/Assistance  Patient can return home with the following Two people to help with walking and/or transfers;Two people to help with bathing/dressing/bathroom;Assistance with feeding;Help with stairs or ramp for entrance;Assist for transportation;Direct supervision/assist for medications management;Direct  supervision/assist for financial management;Assistance with cooking/housework   Equipment Recommendations   (defer to next venue; currently mechanical lift/hospital bed level)    Recommendations for Other Services       Precautions / Restrictions Precautions Precautions: Fall Precaution Booklet Issued: No Precaution Comments: spouse not in room, HEP placed on tray table to reinforce need for ROM/AAROM Required Braces or Orthoses: Knee Immobilizer - Right Knee Immobilizer - Right: Other (comment) (no instructions in ortho notes for KI, placed KI on in supine to reinforce neutral hip/knee posture at rest as pt was moving impulsively in bed at times.) Restrictions Weight Bearing Restrictions: Yes RLE Weight Bearing: Weight bearing as tolerated     Mobility  Bed Mobility Overal bed mobility: Needs Assistance Bed Mobility: Supine to Sit, Sit to Supine     Supine to sit: Total assist, +2 for physical assistance, HOB elevated Sit to supine: Total assist, +2 for physical assistance   General bed mobility comments: attempted to sit EOB, pt requiring totalA +2 via helicopter technique to rotate trunk along with BLE, pillow between his knees to prevent hip ADduction (later clarified with PA no PHPs). Pt with board-like rigid posture sitting EOB with posterior lean and unable to maintain feet on floor or assist with UE for upright trunk posture. Attempting sitting EOB ~2 mins then returned to supine for pt safety due to poor command following. Pt rigid posture throughout.    Transfers Overall transfer level: Needs assistance                 General transfer comment: anticipate totalA via maxi-move lift for OOB transfers based on totalA for bed mobility today and pt unable to follow commands.    Ambulation/Gait  Stairs             Wheelchair Mobility    Modified Rankin (Stroke Patients Only)       Balance Overall balance assessment: Needs  assistance Sitting-balance support: No upper extremity supported, Feet unsupported Sitting balance-Leahy Scale: Zero Sitting balance - Comments: +2 totalA for sitting EOB Postural control: Posterior lean     Standing balance comment: UTA, pt zero seated balance and not following commands to attempt standing safely.                            Cognition Arousal/Alertness: Awake/alert Behavior During Therapy: Restless, Flat affect Overall Cognitive Status: History of cognitive impairments - at baseline                                 General Comments: H/o alzheimer's dementia (per chart, A&Ox2 baseline). Pt not stating wife's name. Pt confused, difficult to redirect for mobility participation, actively resisting movement and not following simple commands. Pt restless attempting to remove soft mitts, once removed not following cues for UE placement. Pt responding appropriately when presented with water (feeder).        Exercises Other Exercises Other Exercises: gentle PROM hip flex (<90'), knee flex/ext - pt inconsistently resisting movement or becoming irritated by it, only able to tolerate 1-2 reps in each direction.    General Comments General comments (skin integrity, edema, etc.): BP 131/89 (101) in supine post-exertion, then 129/77 (93) ~10 mins after session with HOB >40* (PTA had re-entered his room to replace hand mitt which he had doffed and SpO2 WFL at that time as well. HR reading 49/50 bpm, RN notified). Heels floated      Pertinent Vitals/Pain Pain Assessment Pain Assessment: PAINAD Faces Pain Scale: Hurts little more Breathing: normal Negative Vocalization: occasional moan/groan, low speech, negative/disapproving quality Facial Expression: sad, frightened, frown Body Language: rigid, fists clenched, knees up, pushing/pulling away, strikes out Consolability: no need to console PAINAD Score: 4 Pain Location: RLE with PROM and bed mobility Pain  Descriptors / Indicators: Grimacing, Guarding, Sore Pain Intervention(s): Limited activity within patient's tolerance, Monitored during session, Repositioned, Other (comment) (offered ice but pt not able to state yes/no and no family in room to remove it after 10 mins, so defer at this time.)     PT Goals (current goals can now be found in the care plan section) Acute Rehab PT Goals Patient Stated Goal: pt unable to state PT Goal Formulation: Patient unable to participate in goal setting Time For Goal Achievement: 06/14/22 Progress towards PT goals: Progressing toward goals (slowly)    Frequency    Min 3X/week      PT Plan Current plan remains appropriate       AM-PAC PT "6 Clicks" Mobility   Outcome Measure  Help needed turning from your back to your side while in a flat bed without using bedrails?: Total Help needed moving from lying on your back to sitting on the side of a flat bed without using bedrails?: Total Help needed moving to and from a bed to a chair (including a wheelchair)?: Total Help needed standing up from a chair using your arms (e.g., wheelchair or bedside chair)?: Total Help needed to walk in hospital room?: Total Help needed climbing 3-5 steps with a railing? : Total 6 Click Score: 6    End of Session   Activity  Tolerance: Patient limited by pain;Other (comment) (limited by cognition) Patient left: in bed;with call bell/phone within reach;with bed alarm set;Other (comment) (bed in chair posture to promote alertness, lights on in his room, mitts donned for pt safety) Nurse Communication: Mobility status;Precautions;Need for lift equipment (will need mechanical lift for OOB) PT Visit Diagnosis: Other abnormalities of gait and mobility (R26.89);Muscle weakness (generalized) (M62.81);Pain Pain - Right/Left: Right Pain - part of body: Leg     Time: 4037-5436 PT Time Calculation (min) (ACUTE ONLY): 24 min  Charges:  $Therapeutic Activity: 23-37 mins                      Amiliana Foutz P., PTA Acute Rehabilitation Services Secure Chat Preferred 9a-5:30pm Office: [phone]    Dorathy Kinsman [facility] 06/03/2022, 4:34 PM

## 2022-06-03 NOTE — TOC Progression Note (Addendum)
Transition of Care [facility]) - Progression Note    Patient Details  Name: Edwin [name] MRN: [id number] Date of Birth: 11-20-1933  [facility]) CM/SW Contact  Lorri Frederick, LCSW Phone Number: 06/03/2022, 11:44 AM  Clinical Narrative:   Bed offers presented to pt wife over the phone, she is requesting response from [facility].  [facility] off today, left message with Josh at [facility].  1600: unable to get response from [facility].  CSW spoke with pt wife, updated her.  Discussed SNF facilities that can take [employer]et[employer] that are farther away, she would like to try [facility].  If nothing materializes, she will go with [facility] [facility].  Referral sent to [facility], reached out to [place] to review.    Expected Discharge Plan: Skilled Nursing Facility Barriers to Discharge: Continued Medical Work up, SNF Pending bed offer  Expected Discharge Plan and Services In-house Referral: Clinical Social Work   Post Acute Care Choice: Skilled Nursing Facility Living arrangements for the past 2 months: Single Family Home                                       Social Determinants of Health (SDOH) Interventions SDOH Screenings   Tobacco Use: Low Risk  (06/02/2022)    Readmission Risk Interventions     No data to display

## 2022-06-03 NOTE — Progress Notes (Signed)
PROGRESS NOTE    Edwin [name]  HWY:[id number] DOB: Mar 24, 1934 DOA: 05/28/2022 PCP: [name], DO   Brief Narrative:  88 year old white male known history of dementia Prior mechanical hip replacement status post fall 07/2021 Relatively high functioning despite dementia soft, sundowning Developed a mechanical fall had difficulty bearing weight and was found to have a periprosthetic fracture Orthopedics Dr. Dion Saucier was consulted  Assessment & Plan:   Principal Problem:   Closed right hip fracture (HCC) Active Problems:   Dementia without behavioral disturbance (HCC)   Hypotension   Chronic anemia  Right periprosthetic hip fracture: This post right hip hemiarthroplasty revision on 05/30/2022 by Dr. Eulah Pont.  Patient doing well.  Management per orthopedics.  PT OT recommended SNF.  TOC consulted.  Waiting for placement.  Moderate dementia -Pleasantly confused and looks well.  Not on any medications at home.  Low-grade postop fever with hypotension: Had low-grade fever of 100.5 around 3:30 AM on 06/01/2022 and blood pressure was 74/46, lactic acid normal, he was given IV fluids, blood pressure much better today.  He has remained afebrile since then.  Remains on gentle IV hydration.  Acute blood loss on chronic anemia: Baseline globin appears to be between 9 and 11, dropped to 8.4 likely secondary to surgery but no indication of transfusion.  Monitor daily.  DVT prophylaxis: enoxaparin (LOVENOX) injection 40 mg Start: 05/31/22 0800 SCDs Start: 05/30/22 1515 SCDs Start: 05/29/22 0228   Code Status: Full Code  Family Communication:  None present at bedside.   Status is: Inpatient Remains inpatient appropriate because: Awaiting placement   Estimated body mass index is 20.42 kg/m as calculated from the following:   Height as of this encounter: 5\' 11"  (1.803 m).   Weight as of this encounter: 66.4 kg.    Nutritional Assessment: Body mass index is 20.42 kg/m.Marland Kitchen Seen by  dietician.  I agree with the assessment and plan as outlined below: Nutrition Status:  Skin Assessment: I have examined the patient's skin and I agree with the wound assessment as performed by the wound care RN as outlined below:    Consultants:  Orthopedics  Procedures:  As above  Antimicrobials:  Anti-infectives (From admission, onward)    Start     Dose/Rate Route Frequency Ordered Stop   05/30/22 1700  ceFAZolin (ANCEF) IVPB 2g/100 mL premix        2 g 200 mL/hr over 30 Minutes Intravenous Every 6 hours 05/30/22 1514 05/31/22 0242   05/30/22 0817  ceFAZolin (ANCEF) 2-4 GM/100ML-% IVPB       Note to Pharmacy: Kathrene Bongo D: cabinet override      05/30/22 0817 05/30/22 1145   05/30/22 0745  ceFAZolin (ANCEF) IVPB 2g/100 mL premix        2 g 200 mL/hr over 30 Minutes Intravenous On call to O.R. 05/30/22 0647 05/30/22 1135         Subjective:  Seen and examined.  Alert and oriented to self only.  No complaints.  Objective: Vitals:   06/02/22 1447 06/02/22 2058 06/03/22 0520 06/03/22 0741  BP: 101/60 127/79 134/85   Pulse: 86 95 86   Resp: 19 16 16 16   Temp: 98 F (36.7 C) (!) 97.3 F (36.3 C) 98.7 F (37.1 C) 98 F (36.7 C)  TempSrc: Oral Oral Oral Oral  SpO2: 97% 100% 100%   Weight:      Height:        Intake/Output Summary (Last 24 hours) at 06/03/2022 1105 Last data filed  at 06/03/2022 0741 Gross per 24 hour  Intake 620 ml  Output 1050 ml  Net -430 ml    Filed Weights   05/29/22 0240 05/30/22 0822  Weight: 66.4 kg 66.4 kg    Examination:  General exam: Appears calm and comfortable  Respiratory system: Clear to auscultation. Respiratory effort normal. Cardiovascular system: S1 & S2 heard, RRR. No JVD, murmurs, rubs, gallops or clicks. No pedal edema. Gastrointestinal system: Abdomen is nondistended, soft and nontender. No organomegaly or masses felt. Normal bowel sounds heard. Central nervous system: Alert and oriented x 1. No focal  neurological deficits. Extremities: Symmetric 5 x 5 power. Skin: No rashes, lesions or ulcers.   Data Reviewed: I have personally reviewed following labs and imaging studies  CBC: Recent Labs  Lab 05/28/22 2240 05/29/22 0316 05/30/22 1529 05/31/22 1012 06/01/22 0255 06/02/22 0910 06/03/22 0242  WBC 7.1   < > 8.1 12.6* 9.5 7.2 7.1  NEUTROABS 5.6  --   --   --   --  5.7 5.0  HGB 12.6*   < > 11.9* 9.9* 9.3* 8.4* 8.3*  HCT 38.4*   < > 36.0* 28.7* 27.3* 25.4* 24.4*  MCV 85.1   < > 84.9 83.9 83.0 85.2 84.4  PLT 159   < > 111* 119* PLATELET CLUMPS NOTED ON SMEAR, UNABLE TO ESTIMATE 144* 146*   < > = values in this interval not displayed.    Basic Metabolic Panel: Recent Labs  Lab 05/29/22 0316 05/30/22 0302 05/30/22 1529 05/31/22 1012 06/01/22 0255 06/02/22 0910  NA 136 139  --  138 139 141  K 4.3 4.7  --  4.2 4.2 3.8  CL 104 106  --  108 108 111  CO2 26 27  --  25 24 23   GLUCOSE 111* 107*  --  109* 90 121*  BUN 23 21  --  21 18 14   CREATININE 1.12 1.19 0.98 1.23 1.28* 0.92  CALCIUM 9.1 9.2  --  8.6* 8.6* 8.2*  MG 2.4  --   --   --   --   --   PHOS 2.7  --   --   --   --   --     GFR: Estimated Creatinine Clearance: 54.1 mL/min (by C-G formula based on SCr of 0.92 mg/dL). Liver Function Tests: Recent Labs  Lab 05/29/22 0316 05/30/22 0302 05/31/22 1012 06/01/22 0255  AST 32  31 30 36 40  ALT 25  25 24 18 15   ALKPHOS 63  57 60 47 40  BILITOT 0.8  0.7 1.2 0.7 0.8  PROT 7.0  6.5 6.8 5.7* 5.4*  ALBUMIN 3.6  3.1* 3.4* 2.8* 2.6*    No results for input(s): "LIPASE", "AMYLASE" in the last 168 hours. No results for input(s): "AMMONIA" in the last 168 hours. Coagulation Profile: Recent Labs  Lab 05/28/22 2240  INR 1.3*    Cardiac Enzymes: Recent Labs  Lab 05/29/22 0316  CKTOTAL 165    BNP (last 3 results) No results for input(s): "PROBNP" in the last 8760 hours. HbA1C: No results for input(s): "HGBA1C" in the last 72 hours. CBG: No results for  input(s): "GLUCAP" in the last 168 hours. Lipid Profile: No results for input(s): "CHOL", "HDL", "LDLCALC", "TRIG", "CHOLHDL", "LDLDIRECT" in the last 72 hours. Thyroid Function Tests: No results for input(s): "TSH", "T4TOTAL", "FREET4", "T3FREE", "THYROIDAB" in the last 72 hours. Anemia Panel: No results for input(s): "VITAMINB12", "FOLATE", "FERRITIN", "TIBC", "IRON", "RETICCTPCT" in the last 72 hours.  Sepsis Labs: Recent Labs  Lab 06/01/22 1135 06/01/22 1449  LATICACIDVEN 0.8 1.1     Recent Results (from the past 240 hour(s))  Surgical pcr screen     Status: None   Collection Time: 05/30/22  4:18 AM   Specimen: Nasal Mucosa; Nasal Swab  Result Value Ref Range Status   MRSA, PCR NEGATIVE NEGATIVE Final   Staphylococcus aureus NEGATIVE NEGATIVE Final    Comment: (NOTE) The Xpert SA Assay (FDA approved for NASAL specimens in patients 64 years of age and older), is one component of a comprehensive surveillance program. It is not intended to diagnose infection nor to guide or monitor treatment. Performed at [facility], [place] [address], [place], [place] [address]      Radiology Studies: No results found.  Scheduled Meds:  docusate sodium  100 mg Oral BID   enoxaparin (LOVENOX) injection  40 mg Subcutaneous Q24H   Continuous Infusions:  sodium chloride 50 mL/hr at 06/01/22 [id number]   methocarbamol (ROBAXIN) IV       LOS: 6 days   Darliss Cheney, MD [facility]  06/03/2022, 11:05 AM   *Please note that this is a verbal dictation therefore any spelling or grammatical errors are due to the "[place] [employer]" system interpretation.  Please page via [place] and do not message via secure chat for urgent patient care matters. Secure chat can be used for non urgent patient care matters.  How to contact the [facility] Attending or Consulting provider [place] or covering provider during after hours [place], for this patient?  Check the care team in [facility] and look for a)  attending/consulting TRH provider listed and b) the [facility] team listed. Page or secure chat [phone]. Log into [email or web address] and use [place]'s universal password to access. If you do not have the password, please contact the hospital operator. Locate the [facility] provider you are looking for under [facility] and page to a number that you can be directly reached. If you still have difficulty reaching the provider, please page the [facility] (Director on Call) for the Hospitalists listed on amion for assistance.

## 2022-06-04 DIAGNOSIS — S72001A Fracture of unspecified part of neck of right femur, initial encounter for closed fracture: Secondary | ICD-10-CM | POA: Diagnosis not present

## 2022-06-04 NOTE — Progress Notes (Signed)
Pt has rested quietly at intervals, responds verbally at times. Turn and positioned with offloading. Did have temp 100.3 otherwise no noted changes this shift.

## 2022-06-04 NOTE — Anesthesia Postprocedure Evaluation (Signed)
Anesthesia Post Note  Patient: Edwin [name]  Procedure(s) Performed: RIGHT HIP HEMIARTHROPLASTY REVISION (Right: Hip)     Patient location during evaluation: PACU Anesthesia Type: General Level of consciousness: awake Pain management: pain level controlled Vital Signs Assessment: post-procedure vital signs reviewed and stable Respiratory status: spontaneous breathing, nonlabored ventilation, respiratory function stable and patient connected to nasal cannula oxygen Cardiovascular status: blood pressure returned to baseline and stable Postop Assessment: no apparent nausea or vomiting Anesthetic complications: no   No notable events documented.              Shelton Silvas

## 2022-06-04 NOTE — TOC CAGE-AID Note (Signed)
Transition of Care [facility]) - CAGE-AID Screening   Patient Details  Name: Edwin [name] MRN: [id number] Date of Birth: 1934/05/23  Transition of Care [facility]) CM/SW Contact:    Katha Hamming, RN Phone Number:[phone] 06/04/2022, 2:54 AM   CAGE-AID Screening: Substance Abuse Screening unable to be completed due to: : Patient unable to participate (hx dementia, alertx2 at baseline)

## 2022-06-04 NOTE — TOC Progression Note (Addendum)
Transition of Care [facility]) - Progression Note    Patient Details  Name: Edwin [name] MRN: [id number] Date of Birth: 01-08-1934  Transition of Care [facility]) CM/SW Contact  Lorri Frederick, LCSW Phone Number: 06/04/2022, 9:42 AM  Clinical Narrative:   CSW made another attempt to reach [facility], unsuccessful.  [facility], cannot make offer.  CSW spoke with pt wife [name] and she accepts offer at [place].  CSW waiting for confirmation that [facility] that they have bed and can start auth.  [id number]: [facility] confirms and will start auth.  Expected Discharge Plan: Skilled Nursing Facility Barriers to Discharge: Continued Medical Work up, SNF Pending bed offer  Expected Discharge Plan and Services In-house Referral: Clinical Social Work   Post Acute Care Choice: Skilled Nursing Facility Living arrangements for the past 2 months: Single Family Home                                       Social Determinants of Health (SDOH) Interventions SDOH Screenings   Tobacco Use: Low Risk  (06/02/2022)    Readmission Risk Interventions     No data to display

## 2022-06-05 ENCOUNTER — Inpatient Hospital Stay (HOSPITAL_COMMUNITY): Payer: Medicare HMO

## 2022-06-05 DIAGNOSIS — S72001A Fracture of unspecified part of neck of right femur, initial encounter for closed fracture: Secondary | ICD-10-CM | POA: Diagnosis not present

## 2022-06-05 LAB — BASIC METABOLIC PANEL
Anion gap: 6 (ref 5–15)
BUN: 9 mg/dL (ref 8–23)
CO2: 28 mmol/L (ref 22–32)
Calcium: 8.4 mg/dL — ABNORMAL LOW (ref 8.9–10.3)
Chloride: 105 mmol/L (ref 98–111)
Creatinine, Ser: 1.01 mg/dL (ref 0.61–1.24)
GFR, Estimated: >60 mL/min (ref >60)
Glucose, Bld: 104 mg/dL — ABNORMAL HIGH (ref 70–99)
Potassium: 3.9 mmol/L (ref 3.5–5.1)
Sodium: 139 mmol/L (ref 135–145)

## 2022-06-05 LAB — CBC
HCT: 28.6 % — ABNORMAL LOW (ref 39.0–52.0)
Hemoglobin: 9.3 g/dL — ABNORMAL LOW (ref 13.0–17.0)
MCH: 27.6 pg (ref 26.0–34.0)
MCHC: 32.5 g/dL (ref 30.0–36.0)
MCV: 84.9 fL (ref 80.0–100.0)
Platelets: 221 10*3/uL (ref 150–400)
RBC: 3.37 MIL/uL — ABNORMAL LOW (ref 4.22–5.81)
RDW: 14.6 % (ref 11.5–15.5)
WBC: 5.8 10*3/uL (ref 4.0–10.5)
nRBC: 0.5 % — ABNORMAL HIGH (ref 0.0–0.2)

## 2022-06-05 MED ORDER — KATE FARMS STANDARD 1.4 PO LIQD
325.0000 mL | Freq: Two times a day (BID) | ORAL | Status: DC
  Administered 2022-06-06 – 2022-06-09 (×7): 325 mL via ORAL
  Filled 2022-06-05 (×8): qty 325

## 2022-06-05 MED ORDER — ADULT MULTIVITAMIN W/MINERALS CH
1.0000 | ORAL_TABLET | Freq: Every day | ORAL | Status: DC
  Administered 2022-06-06 – 2022-06-09 (×4): 1 via ORAL
  Filled 2022-06-05 (×4): qty 1

## 2022-06-05 NOTE — Progress Notes (Signed)
Physical Therapy Treatment Patient Details Name: Edwin [name] MRN: [id number] DOB: 20-Nov-1933 Today's Date: 06/05/2022   History of Present Illness Pt is an 88 y.o. male admitted 05/28/22 after mechanical fall sustaining R periprosthetic hip fx. S/p R hip hemiarthroplasty revision 12/23. PMH includes dementia, R hip hemiarthroplasty (07/2021).    PT Comments    Pt received in supine, clearance for session per RN as pt has severe dementia and A&O to self only, pt needing totalA for bed mobility/hygiene assist today (pt with very small BM) and guarding significantly due to pain. Pt with slightly improved tolerance for BLE AAROM this date per HEP and handout left in room previous date for family to encourage him to continue mobilizing while in hospital. Pt would benefit from hoyer lift OOB daily to chair for pressure relief/improved pulmonary clearance, written on white board in his room. Pt continues to benefit from PT services to progress toward functional mobility goals.    Recommendations for follow up therapy are one component of a multi-disciplinary discharge planning process, led by the attending physician.  Recommendations may be updated based on patient status, additional functional criteria and insurance authorization.  Follow Up Recommendations  Skilled nursing-short term rehab (<3 hours/day) Can patient physically be transported by private vehicle: No   Assistance Recommended at Discharge Frequent or constant Supervision/Assistance  Patient can return home with the following Two people to help with walking and/or transfers;Two people to help with bathing/dressing/bathroom;Assistance with feeding;Help with stairs or ramp for entrance;Assist for transportation;Direct supervision/assist for medications management;Direct supervision/assist for financial management;Assistance with cooking/housework   Equipment Recommendations   (defer to next venue; currently mechanical lift/hospital bed  level)    Recommendations for Other Services       Precautions / Restrictions Precautions Precautions: Fall Precaution Booklet Issued: No Precaution Comments: spouse not in room, HEP placed on tray table 12/27 to reinforce need for ROM/AAROM Knee Immobilizer - Right: Other (comment) (no instructions in ortho notes for KI, removed to allow for A/AAROM and pt hygiene assist, RN notified KI off) Restrictions Weight Bearing Restrictions: Yes RLE Weight Bearing: Weight bearing as tolerated     Mobility  Bed Mobility Overal bed mobility: Needs Assistance Bed Mobility: Rolling Rolling: Total assist, +2 for physical assistance   Supine to sit: Total assist, +2 for physical assistance, HOB elevated Sit to supine: Total assist, +2 for physical assistance   General bed mobility comments: pt requiring totalA +2 for rolling to L/R sides and maintains rigid posture throughout. Pt rolled x2 reps in each direction during peri-care, guarding due to pain, needing lots of encouragement to participate. Pt had difficulty following cues to place/maintain hands on bed rails while rolling/sidelying. Partial transfer to long sitting from bed chair posture however pt guarding due to pain and unable to maintain upright more than a minute at a time.    Transfers Overall transfer level: Needs assistance                 General transfer comment: anticipate totalA via maxi-move lift for OOB transfers based on totalA for bed mobility today and pt unable to follow commands.    Ambulation/Gait                   Stairs             Wheelchair Mobility    Modified Rankin (Stroke Patients Only)       Balance Overall balance assessment: Needs assistance Sitting-balance support: No upper extremity supported, Feet  unsupported Sitting balance-Leahy Scale: Zero Sitting balance - Comments: +2 totalA for long sitting in bed Postural control: Posterior lean     Standing balance comment:  UTA, pt zero seated balance and not following commands to attempt standing safely.                            Cognition Arousal/Alertness: Awake/alert Behavior During Therapy: Restless, Flat affect Overall Cognitive Status: History of cognitive impairments - at baseline                                 General Comments: H/o alzheimer's dementia (per chart, A&Ox2 baseline). Pt not stating wife's name. Pt confused, difficult to redirect for mobility participation, actively resisting most movements and not following simple commands. Mitts removed during functional mobility tasks but replaced at end of session as pt starting to fiddle with lines. Pt responding appropriately when presented with water (feeder).        Exercises Other Exercises Other Exercises: supine BLE AAROM: ankle pumps, heel slides, hip abduction, LAQ (bed chair posture), SLR (PROM) x10 reps ea    General Comments        Pertinent Vitals/Pain Pain Assessment Pain Assessment: PAINAD Breathing: occasional labored breathing, short period of hyperventilation Negative Vocalization: occasional moan/groan, low speech, negative/disapproving quality Facial Expression: sad, frightened, frown Body Language: rigid, fists clenched, knees up, pushing/pulling away, strikes out Consolability: distracted or reassured by voice/touch PAINAD Score: 6 Pain Location: RLE with PROM and bed mobility/rolling Pain Descriptors / Indicators: Grimacing, Guarding, Sore Pain Intervention(s): Limited activity within patient's tolerance, Monitored during session, Repositioned    Home Living                          Prior Function            PT Goals (current goals can now be found in the care plan section) Acute Rehab PT Goals Patient Stated Goal: pt unable to state PT Goal Formulation: Patient unable to participate in goal setting Time For Goal Achievement: 06/14/22 Progress towards PT goals: Progressing  toward goals    Frequency    Min 3X/week      PT Plan Current plan remains appropriate    Co-evaluation              AM-PAC PT "6 Clicks" Mobility   Outcome Measure  Help needed turning from your back to your side while in a flat bed without using bedrails?: Total (+2 assist) Help needed moving from lying on your back to sitting on the side of a flat bed without using bedrails?: Total Help needed moving to and from a bed to a chair (including a wheelchair)?: Total Help needed standing up from a chair using your arms (e.g., wheelchair or bedside chair)?: Total Help needed to walk in hospital room?: Total Help needed climbing 3-5 steps with a railing? : Total 6 Click Score: 6    End of Session   Activity Tolerance: Patient limited by pain;Other (comment) (limited by cognition) Patient left: in bed;with call bell/phone within reach;with bed alarm set;Other (comment) (bed in chair posture to promote alertness, lights on in his room, mitts donned for pt safety) Nurse Communication: Mobility status;Precautions;Need for lift equipment (will need mechanical lift for OOB; pt pain score) PT Visit Diagnosis: Other abnormalities of gait and mobility (R26.89);Muscle weakness (generalized) (M62.81);Pain  Pain - Right/Left: Right Pain - part of body: Leg     Time: 1511-1536 PT Time Calculation (min) (ACUTE ONLY): 25 min  Charges:  $Therapeutic Exercise: 8-22 mins $Therapeutic Activity: 8-22 mins                     Vicke Plotner P., PTA Acute Rehabilitation Services Secure Chat Preferred 9a-5:30pm Office: [phone]    Dorathy Kinsman [facility] 06/05/2022, 6:18 PM

## 2022-06-05 NOTE — Hospital Course (Addendum)
88-year-old white male known history of dementia, Prior mechanical hip replacement status post fall 07/2021, Relatively high functioning despite dementia soft, sundowning Developed a mechanical fall had difficulty bearing weight and was found to have a periprosthetic fracture Orthopedics Dr. Dion Saucier was consulted and underwent right hip hemiarthroplasty revision on 05/30/2022 by Dr. Eulah Pont doing well postop working with PT OT recommending skilled nursing facility. He has baseline moderate dementia pleasantly confused. He has had low-grade postop fever with hypotension intermittently, also had acute blood loss on chronic anemia .

## 2022-06-05 NOTE — TOC Progression Note (Signed)
Transition of Care [facility]) - Progression Note    Patient Details  Name: Edwin [name] MRN: [id number] Date of Birth: 05-05-1934  Transition of Care [facility]) CM/SW Contact  Lorri Frederick, LCSW Phone Number: 06/05/2022, 11:47 AM  Clinical Narrative:   Per [facility], insurance auth still pending.  They can accept pt over the weekend if the auth is approved.  Adella Nissen is the weekend contact.     Expected Discharge Plan: Skilled Nursing Facility Barriers to Discharge: Continued Medical Work up, SNF Pending bed offer  Expected Discharge Plan and Services In-house Referral: Clinical Social Work   Post Acute Care Choice: Skilled Nursing Facility Living arrangements for the past 2 months: Single Family Home                                       Social Determinants of Health (SDOH) Interventions SDOH Screenings   Tobacco Use: Low Risk  (06/02/2022)    Readmission Risk Interventions     No data to display

## 2022-06-05 NOTE — Progress Notes (Incomplete)
Noted pt coughing and attempting to clear throat  running low grade temps. Placed consult for speech to eval

## 2022-06-05 NOTE — Progress Notes (Signed)
Initial Nutrition Assessment  DOCUMENTATION CODES:   Non-severe (moderate) malnutrition in context of chronic illness  INTERVENTION:   - [employer] 1.4 PO BID, each supplement provides 455 kcal and 20 grams protein  - MVI with minerals daily  NUTRITION DIAGNOSIS:   Moderate Malnutrition related to chronic illness (dementia) as evidenced by moderate fat depletion, moderate muscle depletion.  GOAL:   Patient will meet greater than or equal to 90% of their needs  MONITOR:   PO intake, Supplement acceptance, Labs, Weight trends  REASON FOR ASSESSMENT:   Malnutrition Screening Tool    ASSESSMENT:   88 year old male who presented to the ED on 12/21 after a fall. PMH of dementia, R hip fx s/p hemiarthroplasty. Pt admitted with R periprosthetic hip fx.  12/23 - s/p R hip hemiarthroplasty revision  Discussed pt with NT who reports pt ate fairly well at breakfast this morning. Meal completion of 50% documented for breakfast. NT had not yet been in to feed pt lunch at time of RD visit.  Met with pt at bedside. Pt unable to provide any nutrition history. Per review of notes, pt follows a vegetarian diet at home. Pt does not consume dairy, beef-derived products, eggs or egg-derived products. RD to order vegan oral nutrition supplement to aid pt in meeting kcal and protein needs. Will also order daily MVI with minerals.  Reviewed weight history in chart. Current weight is up ~3 kg compared to weights from February 2023. Based on NFPE, pt meets criteria for moderate malnutrition.  Meal Completion: 10-80%  Medications reviewed and include: colace IVF: NS @ 50 ml/hr  Labs reviewed: hemoglobin 9.3  NUTRITION - FOCUSED PHYSICAL EXAM:  Flowsheet Row Most Recent Value  Orbital Region Moderate depletion  Upper Arm Region Mild depletion  Thoracic and Lumbar Region Moderate depletion  Buccal Region Mild depletion  Temple Region Moderate depletion  Clavicle Bone Region Moderate  depletion  Clavicle and Acromion Bone Region Moderate depletion  Scapular Bone Region Moderate depletion  Dorsal Hand Mild depletion  Patellar Region Moderate depletion  Anterior Thigh Region Moderate depletion  Posterior Calf Region Unable to assess  Edema (RD Assessment) None  Hair Reviewed  Eyes Reviewed  Mouth Reviewed  Skin Reviewed  Nails Reviewed       Diet Order:   Diet Order             Diet vegetarian Room service appropriate? Yes; Fluid consistency: Thin  Diet effective now                   EDUCATION NEEDS:   Not appropriate for education at this time  Skin:  Skin Assessment: Skin Integrity Issues: Incisions: closed incision leg  Last BM:  06/04/22 large type 6  Height:   Ht Readings from Last 1 Encounters:  05/30/22 _0  (1.803 m)    Weight:   Wt Readings from Last 1 Encounters:  05/30/22 66.4 kg    BMI:  Body mass index is 20.42 kg/m.  Estimated Nutritional Needs:   Kcal:  1800-2000  Protein:  85-100 grams  Fluid:  1.8-2.0 L    Gustavus Bryant, MS, RD, LDN Inpatient Clinical Dietitian Please see AMiON for contact information.

## 2022-06-05 NOTE — Care Management Important Message (Signed)
Important Message  Patient Details  Name: Edwin [name] MRN: [id number] Date of Birth: 02/22/1934   Medicare Important Message Given:  Yes     Sherilyn Banker 06/05/2022, 11:00 AM

## 2022-06-05 NOTE — Progress Notes (Signed)
PROGRESS NOTE Edwin [name]  JQZ:[id number] DOB: 17-Jan-1934 DOA: 05/28/2022 PCP: [name], DO   Brief Narrative/Hospital Course: 88-year-old white male known history of dementia, Prior mechanical hip replacement status post fall 07/2021, Relatively high functioning despite dementia soft, sundowning Developed a mechanical fall had difficulty bearing weight and was found to have a periprosthetic fracture Orthopedics Dr. Dion Saucier was consulted and underwent right hip hemiarthroplasty revision on 05/30/2022 by Dr. Eulah Pont doing well postop working with PT OT recommending skilled nursing facility. He has baseline moderate dementia pleasantly confused. He has had low-grade postop fever with hypotension intermittently, also had acute blood loss on chronic anemia .    Subjective: Seen and examined this morning He is alert awake able to tell me his name He does not know he is in the hospital Had temp of 101.3 at 125pm 12/28, afebrile overnight Labs reviewed from 12/27 with chronic anemia 8.3 g and stable renal function on 12/26 Chest x-ray respiratory viral panels and influenza swab ordered for fever   Assessment and Plan: Principal Problem:   Closed right hip fracture (HCC) Active Problems:   Dementia without behavioral disturbance (HCC)   Hypotension   Chronic anemia  Right periprosthetic hip fracture:  underwent right hip hemiarthroplasty revision on 05/30/2022 by Dr. Eulah Pont doing well postop working with PT OT recommending skilled nursing facility  Moderate dementia: Alert oriented x 1 only.  Minimize delirium, continue supportive care fall precaution.  Low-grade postop fever with hypotension: Had low-grade fever of 100.5 around 3:30 AM on 06/01/2022 and blood pressure was 74/46, lactic acid normal, he was given IV fluids, blood pressure improved.  Febrile overnight 12/28 again> CBC this morning stable  Chest x-ray no acute finding.  Sending respiratory virus panel.  Acute blood  loss in the setting of chronic anemia transfuse for less than 7 g.  Monitor  Recent Labs  Lab 05/31/22 1012 06/01/22 0255 06/02/22 0910 06/03/22 0242 06/05/22 0959  HGB 9.9* 9.3* 8.4* 8.3* 9.3*  HCT 28.7* 27.3* 25.4* 24.4* 28.6*     DVT prophylaxis: enoxaparin (LOVENOX) injection 40 mg Start: 05/31/22 0800 SCDs Start: 05/30/22 1515 SCDs Start: 05/29/22 0228 Code Status:   Code Status: Full Code Family Communication: plan of care discussed with patient at bedside. Patient status is: Inpatient because of awaiting placement Level of care: Med-Surg   Dispo: Anticipated disposition: SNF Objective: Vitals last 24 hrs: Vitals:   06/04/22 2000 06/05/22 0900 06/05/22 1025 06/05/22 1057  BP:   (!) 84/51 110/66  Pulse:      Resp:      Temp: 100 F (37.8 C)     TempSrc: Axillary     SpO2:  100%    Weight:      Height:       Weight change:   Physical Examination: General exam: alert awake oriented to self only, older than stated age HEENT:Oral mucosa moist, Ear/Nose WNL grossly Respiratory system: bilaterally clear BS, no use of accessory muscle Cardiovascular system: S1 & S2 +, No JVD. Gastrointestinal system: Abdomen soft,NT,ND, BS+ Nervous System:Alert, awake, moving extremities. Extremities: LE edema neg,distal peripheral pulses palpable.  Skin: No rashes,no icterus. MSK: Normal muscle bulk,tone, power  Medications reviewed:  Scheduled Meds:  docusate  100 mg Oral BID   enoxaparin (LOVENOX) injection  40 mg Subcutaneous Q24H   Continuous Infusions:  sodium chloride 50 mL/hr at 06/04/22 1521   methocarbamol (ROBAXIN) IV      Diet Order  Diet vegetarian Room service appropriate? Yes; Fluid consistency: Thin  Diet effective now                  Intake/Output Summary (Last 24 hours) at 06/05/2022 1357 Last data filed at 06/05/2022 0951 Gross per 24 hour  Intake 120 ml  Output --  Net 120 ml   Net IO Since Admission: -1,313.72 mL [06/05/22 1357]   Wt Readings from Last 3 Encounters:  05/30/22 66.4 kg  08/05/21 63.5 kg  07/27/21 63.7 kg     Unresulted Labs (From admission, onward)     Start     Ordered   06/06/22 0500  Creatinine, serum  (enoxaparin (LOVENOX)  CrCl >/= 30 mL/min  )  Weekly,   R     Comments: while on enoxaparin therapy.    05/30/22 1514   06/05/22 0940  Respiratory (~20 pathogens) panel by PCR  (Respiratory panel by PCR (~20 pathogens, ~24 hr TAT)  w precautions)  Once,   R        06/05/22 0941          Data Reviewed: I have personally reviewed following labs and imaging studies CBC: Recent Labs  Lab 05/31/22 1012 06/01/22 0255 06/02/22 0910 06/03/22 0242 06/05/22 0959  WBC 12.6* 9.5 7.2 7.1 5.8  NEUTROABS  --   --  5.7 5.0  --   HGB 9.9* 9.3* 8.4* 8.3* 9.3*  HCT 28.7* 27.3* 25.4* 24.4* 28.6*  MCV 83.9 83.0 85.2 84.4 84.9  PLT 119* PLATELET CLUMPS NOTED ON SMEAR, UNABLE TO ESTIMATE 144* 146* 221   Basic Metabolic Panel: Recent Labs  Lab 05/30/22 0302 05/30/22 1529 05/31/22 1012 06/01/22 0255 06/02/22 0910 06/05/22 0959  NA 139  --  138 139 141 139  K 4.7  --  4.2 4.2 3.8 3.9  CL 106  --  108 108 111 105  CO2 27  --  25 24 23 28   GLUCOSE 107*  --  109* 90 121* 104*  BUN 21  --  21 18 14 9   CREATININE 1.19 0.98 1.23 1.28* 0.92 1.01  CALCIUM 9.2  --  8.6* 8.6* 8.2* 8.4*   GFR: Estimated Creatinine Clearance: 49.3 mL/min (by C-G formula based on SCr of 1.01 mg/dL). Liver Function Tests: Recent Labs  Lab 05/30/22 0302 05/31/22 1012 06/01/22 0255  AST 30 36 40  ALT 24 18 15   ALKPHOS 60 47 40  BILITOT 1.2 0.7 0.8  PROT 6.8 5.7* 5.4*  ALBUMIN 3.4* 2.8* 2.6*   Recent Results (from the past 240 hour(s))  Surgical pcr screen     Status: None   Collection Time: 05/30/22  4:18 AM   Specimen: Nasal Mucosa; Nasal Swab  Result Value Ref Range Status   MRSA, PCR NEGATIVE NEGATIVE Final   Staphylococcus aureus NEGATIVE NEGATIVE Final    Comment: (NOTE) The Xpert SA Assay (FDA  approved for NASAL specimens in patients 51 years of age and older), is one component of a comprehensive surveillance program. It is not intended to diagnose infection nor to guide or monitor treatment. Performed at [facility], [address] [place], [facility] [address]     Antimicrobials: Anti-infectives (From admission, onward)    Start     Dose/Rate Route Frequency Ordered Stop   05/30/22 1700  ceFAZolin (ANCEF) IVPB 2g/100 mL premix        2 g 200 mL/hr over 30 Minutes Intravenous Every 6 hours 05/30/22 1514 05/31/22 0242   05/30/22  5643  ceFAZolin (ANCEF) 2-4 GM/100ML-% IVPB       Note to Pharmacy: Kathrene Bongo D: cabinet override      05/30/22 0817 05/30/22 1145   05/30/22 0745  ceFAZolin (ANCEF) IVPB 2g/100 mL premix        2 g 200 mL/hr over 30 Minutes Intravenous On call to O.R. 05/30/22 0647 05/30/22 1135      Culture/Microbiology No results found for: "SDES", "SPECREQUEST", "CULT", "REPTSTATUS"   radiology Studies: DG Chest Port 1 View  Result Date: 06/05/2022 CLINICAL DATA:  Fever EXAM: PORTABLE CHEST 1 VIEW COMPARISON:  Chest radiograph 05/27/2022 FINDINGS: The heart is enlarged. The upper mediastinum is prominent measuring up to 10.4 cm transverse. There is no focal consolidation or pulmonary edema. There is no pleural effusion or pneumothorax. Overall, no significant interval change in lung aeration. There is no acute osseous abnormality. IMPRESSION: 1. No significant interval change in lung aeration since 03/28/2022. No new or worsening focal airspace disease. 2. Prominent upper mediastinum. Consider nonemergent CT chest to evaluate for aortic aneurysm. Electronically Signed   By: Lesia Hausen M.D.   On: 06/05/2022 10:59     LOS: 8 days   Lanae Boast, MD [facility]  06/05/2022, 1:57 PM

## 2022-06-05 NOTE — Evaluation (Signed)
Clinical/Bedside Swallow Evaluation Patient Details  Name: Edwin [name] MRN: [id number] Date of Birth: January 12, 1934  Today's Date: 06/05/2022 Time: SLP Start Time (ACUTE ONLY): 1257 SLP Stop Time (ACUTE ONLY): 1313 SLP Time Calculation (min) (ACUTE ONLY): 16 min  Past Medical History:  Past Medical History:  Diagnosis Date   Dementia [facility])    Past Surgical History:  Past Surgical History:  Procedure Laterality Date   HIP ARTHROPLASTY Right 07/27/2021   Procedure: ARTHROPLASTY BIPOLAR HIP (HEMIARTHROPLASTY);  Surgeon: Sheral Apley, MD;  Location: [facility] ORS;  Service: Orthopedics;  Laterality: Right;   HIP ARTHROPLASTY Right 05/30/2022   Procedure: RIGHT HIP HEMIARTHROPLASTY REVISION;  Surgeon: Sheral Apley, MD;  Location: [facility] OR;  Service: Orthopedics;  Laterality: Right;   HPI:  Pt is an 88 y.o. male admitted 05/28/22 after mechanical fall sustaining R periprosthetic hip fx. S/p R hip hemiarthroplasty revision 12/23. PMH includes dementia, R hip hemiarthroplasty (07/2021).    Assessment / Plan / Recommendation  Clinical Impression  Pt presents with quite functional swallowing. He had difficulty following commands and demonstrated mild oral holding, all behaviors consistent with dementia, but he showed good oral attention, recognition of food items and appropriate motor patterns to shift between solids and liquids. He drank thin water with and without straw with no concerns for aspiration.   He may have occasional throat-clearing which is normal and should not cause alarm. If he is able, would encourage self-feeding as much as possible with assistance from staff - pts who rely on others to be fed are at greater risk for aspiration and its adverse consequences.  There are no further acute care SLP needs - continue on current diet; give meds whole in puree if he coughs when taking them with liquid. Our service will sign off. SLP Visit Diagnosis: Dysphagia, unspecified (R13.10)     Aspiration Risk  Mild aspiration risk    Diet Recommendation   Vegetarian diet; thin liquids  Medication Administration: Whole meds with liquid    Other  Recommendations Oral Care Recommendations: Oral care BID    Recommendations for follow up therapy are one component of a multi-disciplinary discharge planning process, led by the attending physician.  Recommendations may be updated based on patient status, additional functional criteria and insurance authorization.  Follow up Recommendations No SLP follow up        Swallow Study   General HPI: Pt is an 88 y.o. male admitted 05/28/22 after mechanical fall sustaining R periprosthetic hip fx. S/p R hip hemiarthroplasty revision 12/23. PMH includes dementia, R hip hemiarthroplasty (07/2021). Type of Study: Bedside Swallow Evaluation Previous Swallow Assessment: no Diet Prior to this Study: Regular;Thin liquids (vegetarian diet) Temperature Spikes Noted: Yes Respiratory Status: Room air History of Recent Intubation: No Behavior/Cognition: Alert;Cooperative;Pleasant mood Oral Cavity Assessment: Within Functional Limits Oral Care Completed by SLP: No Oral Cavity - Dentition: Adequate natural dentition Self-Feeding Abilities: Needs assist Patient Positioning: Upright in bed Baseline Vocal Quality: Normal Volitional Cough: Cognitively unable to elicit Volitional Swallow: Unable to elicit    Oral/Motor/Sensory Function Overall Oral Motor/Sensory Function: Within functional limits   Ice Chips Ice chips: Within functional limits   Thin Liquid Thin Liquid: Within functional limits    Nectar Thick Nectar Thick Liquid: Not tested   Honey Thick Honey Thick Liquid: Not tested   Puree Puree: Within functional limits   Solid     Solid: Within functional limits      Blenda Mounts Laurice 06/05/2022,1:17 PM  Damel Querry L. Samson Fred[place], [place]  CCC/SLP Clinical Specialist - Acute Care SLP Acute Rehabilitation Services Office number  [phone]

## 2022-06-06 DIAGNOSIS — S72001A Fracture of unspecified part of neck of right femur, initial encounter for closed fracture: Secondary | ICD-10-CM | POA: Diagnosis not present

## 2022-06-06 LAB — CREATININE, SERUM
Creatinine, Ser: 1 mg/dL (ref 0.61–1.24)
GFR, Estimated: >60 mL/min (ref >60)

## 2022-06-06 NOTE — Plan of Care (Signed)
  Problem: Health Behavior/Discharge Planning: Goal: Ability to manage health-related needs will improve Outcome: Progressing   Problem: Clinical Measurements: Goal: Will remain free from infection Outcome: Progressing   Problem: Activity: Goal: Risk for activity intolerance will decrease Outcome: Progressing   Problem: Nutrition: Goal: Adequate nutrition will be maintained Outcome: Progressing   

## 2022-06-06 NOTE — Progress Notes (Signed)
PROGRESS NOTE Edwin [name]  [id number] DOB: 1933-08-10 DOA: 05/28/2022 PCP: Sherle Poe, DO   Brief Narrative/Hospital Course: 88-year-old white male known history of dementia, Prior mechanical hip replacement status post fall 07/2021, Relatively high functioning despite dementia soft, sundowning Developed a mechanical fall had difficulty bearing weight and was found to have a periprosthetic fracture Orthopedics Dr. Mardelle Matte was consulted and underwent right hip hemiarthroplasty revision on 05/30/2022 by Dr. Percell Miller doing well postop working with PT OT recommending skilled nursing facility. He has baseline moderate dementia pleasantly confused. He has had low-grade postop fever with hypotension intermittently, also had acute blood loss on chronic anemia .  Subjective: Seen and examined.  Pleasantly confused alert awake oriented to self Has no complaint Patient has been afebrile since 12/28 Labs reviewed creatinine stable 1.0.  Hemoglobin stable 9.3 on 12/29  Assessment and Plan: Principal Problem:   Closed right hip fracture [facility]) Active Problems:   Dementia without behavioral disturbance (HCC)   Hypotension   Chronic anemia  Right periprosthetic hip fracture:  underwent right hip hemiarthroplasty revision on 05/30/2022 by Dr. Percell Miller doing well postop working with PT OT recommending skilled nursing facility  Moderate dementia: Patient is alert awake oriented x 1 only.  Continue on delirium precaution, continue supportive care fall precaution.  Low-grade postop fever with hypotension: Had low-grade fever of 100.5 around 3:30 AM on 06/01/2022 and blood pressure was 74/46, lactic acid normal, he was given IV fluids, blood pressure improved.  Febrile overnight 12/28 again> CBC 12/29stable  Chest x-ray no acute finding. pending respiratory virus panel.  Acute blood loss in the setting of chronic anemia transfuse for less than 7 g and monitor Recent Labs  Lab 05/31/22 1012  06/01/22 0255 06/02/22 0910 06/03/22 0242 06/05/22 0959  HGB 9.9* 9.3* 8.4* 8.3* 9.3*  HCT 28.7* 27.3* 25.4* 24.4* 28.6*  DVT prophylaxis: enoxaparin (LOVENOX) injection 40 mg Start: 05/31/22 0800 SCDs Start: 05/30/22 1515 SCDs Start: 05/29/22 0228 Code Status:   Code Status: Full Code Family Communication: plan of care discussed with patient at bedside. Patient status is: Inpatient because of awaiting placement Level of care: Med-Surg   Dispo: Anticipated disposition: SNF Objective: Vitals last 24 hrs: Vitals:   06/05/22 1057 06/05/22 2200 06/06/22 0625 06/06/22 0900  BP: 110/66 109/76 114/76 114/76  Pulse:  81  98  Resp:  16  20  Temp:  100.3 F (37.9 C)  99.6 F (37.6 C)  TempSrc:  Axillary  Axillary  SpO2:  100%  96%  Weight:      Height:       Weight change:   Physical Examination: General exam: AAox1, weak,older appearing HEENT:Oral mucosa moist, Ear/Nose WNL grossly, dentition normal. Respiratory system: bilaterally clearBS, no use of accessory muscle Cardiovascular system: S1 & S2 +, regular rate. Gastrointestinal system: Abdomen soft,NT,ND,BS+ Nervous System:Alert, awake, moving extremities and grossly nonfocal Extremities: LE ankle edema neg, right hip with Aquacel dressing in place Skin: No rashes,no icterus. MSK: Normal muscle bulk,tone, power   Medications reviewed:  Scheduled Meds:  docusate  100 mg Oral BID   enoxaparin (LOVENOX) injection  40 mg Subcutaneous Q24H   feeding supplement (KATE FARMS STANDARD 1.4)  325 mL Oral BID BM   multivitamin with minerals  1 tablet Oral Daily   Continuous Infusions:  sodium chloride 50 mL/hr at 06/06/22 0906   methocarbamol (ROBAXIN) IV      Diet Order             Diet vegetarian Room service  appropriate? Yes; Fluid consistency: Thin  Diet effective now                  Intake/Output Summary (Last 24 hours) at 06/06/2022 1204 Last data filed at 06/06/2022 1000 Gross per 24 hour  Intake 240 ml   Output 1400 ml  Net -1160 ml   Net IO Since Admission: -2,353.72 mL [06/06/22 1204]  Wt Readings from Last 3 Encounters:  05/30/22 66.4 kg  08/05/21 63.5 kg  07/27/21 63.7 kg     Unresulted Labs (From admission, onward)     Start     Ordered   06/06/22 0500  Creatinine, serum  (enoxaparin (LOVENOX)  CrCl >/= 30 mL/min  )  Weekly,   R     Comments: while on enoxaparin therapy.    05/30/22 1514   06/05/22 0940  Respiratory (~20 pathogens) panel by PCR  (Respiratory panel by PCR (~20 pathogens, ~24 hr TAT)  w precautions)  Once,   R        06/05/22 0941          Data Reviewed: I have personally reviewed following labs and imaging studies CBC: Recent Labs  Lab 05/31/22 1012 06/01/22 0255 06/02/22 0910 06/03/22 0242 06/05/22 0959  WBC 12.6* 9.5 7.2 7.1 5.8  NEUTROABS  --   --  5.7 5.0  --   HGB 9.9* 9.3* 8.4* 8.3* 9.3*  HCT 28.7* 27.3* 25.4* 24.4* 28.6*  MCV 83.9 83.0 85.2 84.4 84.9  PLT 119* PLATELET CLUMPS NOTED ON SMEAR, UNABLE TO ESTIMATE 144* 146* 221   Basic Metabolic Panel: Recent Labs  Lab 05/31/22 1012 06/01/22 0255 06/02/22 0910 06/05/22 0959 06/06/22 0556  NA 138 139 141 139  --   K 4.2 4.2 3.8 3.9  --   CL 108 108 111 105  --   CO2 25 24 23 28   --   GLUCOSE 109* 90 121* 104*  --   BUN 21 18 14 9   --   CREATININE 1.23 1.28* 0.92 1.01 1.00  CALCIUM 8.6* 8.6* 8.2* 8.4*  --    GFR: Estimated Creatinine Clearance: 49.8 mL/min (by C-G formula based on SCr of 1 mg/dL). Liver Function Tests: Recent Labs  Lab 05/31/22 1012 06/01/22 0255  AST 36 40  ALT 18 15  ALKPHOS 47 40  BILITOT 0.7 0.8  PROT 5.7* 5.4*  ALBUMIN 2.8* 2.6*   Recent Results (from the past 240 hour(s))  Surgical pcr screen     Status: None   Collection Time: 05/30/22  4:18 AM   Specimen: Nasal Mucosa; Nasal Swab  Result Value Ref Range Status   MRSA, PCR NEGATIVE NEGATIVE Final   Staphylococcus aureus NEGATIVE NEGATIVE Final    Comment: (NOTE) The Xpert SA Assay (FDA  approved for NASAL specimens in patients 16 years of age and older), is one component of a comprehensive surveillance program. It is not intended to diagnose infection nor to guide or monitor treatment. Performed at [facility], [address] [place], [address] [place]     Antimicrobials: Anti-infectives (From admission, onward)    Start     Dose/Rate Route Frequency Ordered Stop   05/30/22 1700  ceFAZolin (ANCEF) IVPB 2g/100 mL premix        2 g 200 mL/hr over 30 Minutes Intravenous Every 6 hours 05/30/22 1514 05/31/22 0242   05/30/22 0817  ceFAZolin (ANCEF) 2-4 GM/100ML-% IVPB       Note to Pharmacy: 06/02/22 D: cabinet override  05/30/22 0817 05/30/22 1145   05/30/22 0745  ceFAZolin (ANCEF) IVPB 2g/100 mL premix        2 g 200 mL/hr over 30 Minutes Intravenous On call to O.R. 05/30/22 0647 05/30/22 1135      Culture/Microbiology No results found for: "SDES", "SPECREQUEST", "CULT", "REPTSTATUS"   radiology Studies: DG Chest Port 1 View  Result Date: 06/05/2022 CLINICAL DATA:  Fever EXAM: PORTABLE CHEST 1 VIEW COMPARISON:  Chest radiograph 05/27/2022 FINDINGS: The heart is enlarged. The upper mediastinum is prominent measuring up to 10.4 cm transverse. There is no focal consolidation or pulmonary edema. There is no pleural effusion or pneumothorax. Overall, no significant interval change in lung aeration. There is no acute osseous abnormality. IMPRESSION: 1. No significant interval change in lung aeration since 03/28/2022. No new or worsening focal airspace disease. 2. Prominent upper mediastinum. Consider nonemergent CT chest to evaluate for aortic aneurysm. Electronically Signed   By: Valetta Mole M.D.   On: 06/05/2022 10:59     LOS: 9 days   Antonieta Pert, MD [facility]  06/06/2022, 12:04 PM

## 2022-06-07 DIAGNOSIS — S72001A Fracture of unspecified part of neck of right femur, initial encounter for closed fracture: Secondary | ICD-10-CM | POA: Diagnosis not present

## 2022-06-07 LAB — RESPIRATORY PANEL BY PCR

## 2022-06-07 NOTE — Progress Notes (Signed)
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 Dion Saucier was consulted and underwent right hip hemiarthroplasty revision on 05/30/2022 by Dr. Eulah Pont doing well postop working with PT OT recommending skilled nursing facility. He has baseline moderate dementia pleasantly confused. He has had low-grade postop fever with hypotension intermittently, also had acute blood loss on chronic anemia .  Subjective: Seen examined this morning resting comfortably.  No complaints.   Labs reviewed creatinine stable 1.0.  Hemoglobin stable 9.3 on 12/29 He has been afebrile since 12/28 Assessment and Plan: Principal Problem:   Closed right hip fracture [facility]) Active Problems:   Dementia without behavioral disturbance (HCC)   Hypotension   Chronic anemia  Right periprosthetic hip fracture:  underwent right hip hemiarthroplasty revision on 05/30/2022 by Dr. Eulah Pont doing well postop working with PT OT recommending skilled nursing facility  Moderate dementia: Patient is alert awake oriented x 1 only.  Continue on delirium precaution, continue supportive care fall precaution.  Low-grade postop fever with hypotension: Had low-grade fever of 100.5 around 3:30 AM on 06/01/2022 and blood pressure was 74/46, lactic acid normal, he was given IV fluids, blood pressure improved.  Febrile overnight 12/28 and afebrile since, CBC 12/29 stable  Chest x-ray no acute finding.  Negative respiratory virus panel.  Acute blood loss in the setting of chronic anemia transfuse for less than 7 g and monitor Recent Labs  Lab 06/01/22 0255 06/02/22 0910  06/03/22 0242 06/05/22 0959  HGB 9.3* 8.4* 8.3* 9.3*  HCT 27.3* 25.4* 24.4* 28.6*   DVT prophylaxis: enoxaparin (LOVENOX) injection 40 mg Start: 05/31/22 0800 SCDs Start: 05/30/22 1515 SCDs Start: 05/29/22 0228 Code Status:   Code Status: Full Code Family Communication: plan of care discussed with patient at bedside. Called his wife Edwin [name] and updated. She had some foot injury and has not been visiting him.  Patient status is: Inpatient because of awaiting placement Level of care: Med-Surg   Dispo: Anticipated disposition:SNF Objective: Vitals last 24 hrs: Vitals:   06/06/22 0625 06/06/22 0900 06/06/22 2012 06/07/22 0434  BP: 114/76 114/76 (!) 142/77 101/67  Pulse:  98  90  Resp:  20 20 18   Temp:  99.6 F (37.6 C) 97.9 F (36.6 C) 98.8 F (37.1 C)  TempSrc:  Axillary Oral Oral  SpO2:  96% 100% 98%  Weight:      Height:       Weight change:   Physical Examination: General exam: Sleepy but able to move his extremities HEENT:Oral mucosa moist, Ear/Nose WNL grossly, dentition normal. Respiratory system: bilaterally clear BS, no use of accessory muscle Cardiovascular system: S1 & S2 +, regular rate. Gastrointestinal system: Abdomen soft,NT,ND,BS+ Nervous System: sleeping, moving extremities and grossly nonfocal Extremities: LE ankle edema neg, rt hip incision site with Aquacel dressing, lower extremities warm Skin: No rashes,no icterus. MSK: Normal muscle bulk,tone, power    Medications reviewed:  Scheduled Meds:  docusate  100 mg Oral BID   enoxaparin (LOVENOX) injection  40 mg Subcutaneous Q24H   feeding supplement (KATE FARMS STANDARD 1.4)  325 mL Oral BID BM   multivitamin with minerals  1 tablet Oral Daily   Continuous Infusions:  sodium chloride 50 mL/hr at 06/07/22 0436  methocarbamol (ROBAXIN) IV      Diet Order             Diet vegetarian Room service appropriate? Yes; Fluid consistency: Thin  Diet effective now                  Intake/Output  Summary (Last 24 hours) at 06/07/2022 1047 Last data filed at 06/07/2022 0436 Gross per 24 hour  Intake 120 ml  Output 1350 ml  Net -1230 ml    Net IO Since Admission: -3,583.72 mL [06/07/22 1047]  Wt Readings from Last 3 Encounters:  05/30/22 66.4 kg  08/05/21 63.5 kg  07/27/21 63.7 kg     Unresulted Labs (From admission, onward)     Start     Ordered   06/06/22 0500  Creatinine, serum  (enoxaparin (LOVENOX)  CrCl >/= 30 mL/min  )  Weekly,   R     Comments: while on enoxaparin therapy.    05/30/22 1514          Data Reviewed: I have personally reviewed following labs and imaging studies CBC: Recent Labs  Lab 06/01/22 0255 06/02/22 0910 06/03/22 0242 06/05/22 0959  WBC 9.5 7.2 7.1 5.8  NEUTROABS  --  5.7 5.0  --   HGB 9.3* 8.4* 8.3* 9.3*  HCT 27.3* 25.4* 24.4* 28.6*  MCV 83.0 85.2 84.4 84.9  PLT PLATELET CLUMPS NOTED ON SMEAR, UNABLE TO ESTIMATE 144* 146* 221    Basic Metabolic Panel: Recent Labs  Lab 06/01/22 0255 06/02/22 0910 06/05/22 0959 06/06/22 0556  NA 139 141 139  --   K 4.2 3.8 3.9  --   CL 108 111 105  --   CO2 24 23 28   --   GLUCOSE 90 121* 104*  --   BUN 18 14 9   --   CREATININE 1.28* 0.92 1.01 1.00  CALCIUM 8.6* 8.2* 8.4*  --     GFR: Estimated Creatinine Clearance: 49.8 mL/min (by C-G formula based on SCr of 1 mg/dL). Liver Function Tests: Recent Labs  Lab 06/01/22 0255  AST 40  ALT 15  ALKPHOS 40  BILITOT 0.8  PROT 5.4*  ALBUMIN 2.6*    Recent Results (from the past 240 hour(s))  Surgical pcr screen     Status: None   Collection Time: 05/30/22  4:18 AM   Specimen: Nasal Mucosa; Nasal Swab  Result Value Ref Range Status   MRSA, PCR NEGATIVE NEGATIVE Final   Staphylococcus aureus NEGATIVE NEGATIVE Final    Comment: (NOTE) The Xpert SA Assay (FDA approved for NASAL specimens in patients 96 years of age and older), is one component of a comprehensive surveillance program. It is not intended to diagnose infection nor  to guide or monitor treatment. Performed at [facility], [address] [place], [address] [place]   Respiratory (~20 pathogens) panel by PCR     Status: None   Collection Time: 06/06/22  6:00 PM   Specimen: Nasopharyngeal Swab; Respiratory  Result Value Ref Range Status   Adenovirus NOT DETECTED NOT DETECTED Final   Coronavirus 229E NOT DETECTED NOT DETECTED Final    Comment: (NOTE) The Coronavirus on the Respiratory Panel, DOES NOT test for the novel  Coronavirus (2019 nCoV)    Coronavirus HKU1 NOT DETECTED NOT DETECTED Final   Coronavirus NL63 NOT DETECTED NOT DETECTED Final   Coronavirus OC43 NOT DETECTED NOT DETECTED Final   Metapneumovirus NOT DETECTED NOT DETECTED Final   Rhinovirus / Enterovirus NOT DETECTED  NOT DETECTED Final   Influenza A NOT DETECTED NOT DETECTED Final   Influenza B NOT DETECTED NOT DETECTED Final   Parainfluenza Virus 1 NOT DETECTED NOT DETECTED Final   Parainfluenza Virus 2 NOT DETECTED NOT DETECTED Final   Parainfluenza Virus 3 NOT DETECTED NOT DETECTED Final   Parainfluenza Virus 4 NOT DETECTED NOT DETECTED Final   Respiratory Syncytial Virus NOT DETECTED NOT DETECTED Final   Bordetella pertussis NOT DETECTED NOT DETECTED Final   Bordetella Parapertussis NOT DETECTED NOT DETECTED Final   Chlamydophila pneumoniae NOT DETECTED NOT DETECTED Final   Mycoplasma pneumoniae NOT DETECTED NOT DETECTED Final    Comment: Performed at [facility], [address], [place], [place] [address]    Antimicrobials: Anti-infectives (From admission, onward)    Start     Dose/Rate Route Frequency Ordered Stop   05/30/22 1700  ceFAZolin (ANCEF) IVPB 2g/100 mL premix        2 g 200 mL/hr over 30 Minutes Intravenous Every 6 hours 05/30/22 1514 05/31/22 0242   05/30/22 0817  ceFAZolin (ANCEF) 2-4 GM/100ML-% IVPB       Note to Pharmacy: Kathrene Bongo D: cabinet override      05/30/22 0817 05/30/22 1145   05/30/22 0745  ceFAZolin (ANCEF) IVPB 2g/100 mL  premix        2 g 200 mL/hr over 30 Minutes Intravenous On call to O.R. 05/30/22 0647 05/30/22 1135      Culture/Microbiology No results found for: "SDES", "SPECREQUEST", "CULT", "REPTSTATUS"   radiology Studies: No results found.   LOS: 10 days   Lanae Boast, MD [facility]  06/07/2022, 10:47 AM

## 2022-06-08 MED ORDER — KATE FARMS STANDARD 1.4 PO LIQD: 325.0000 mL | Freq: Two times a day (BID) | ORAL | Status: DC

## 2022-06-08 MED ORDER — ADULT MULTIVITAMIN W/MINERALS CH: 1.0000 | ORAL_TABLET | Freq: Every day | ORAL | Status: DC

## 2022-06-08 MED ORDER — DOCUSATE SODIUM 50 MG/5ML PO LIQD: 100.0000 mg | Freq: Two times a day (BID) | ORAL | 0 refills | Status: DC

## 2022-06-08 NOTE — Progress Notes (Signed)
PROGRESS NOTE Edwin [name]  KGY:[id number] DOB: [birth year] DOA: 05/28/2022 PCP: Sherle Poe, DO   Brief Narrative/Hospital Course: 89-year-old white male known history of dementia, Prior mechanical hip replacement status post fall 07/2021, Relatively high functioning despite dementia soft, sundowning Developed a mechanical fall had difficulty bearing weight and was found to have a periprosthetic fracture Orthopedics Dr. Mardelle Matte was consulted and underwent right hip hemiarthroplasty revision on 05/30/2022 by Dr. Percell Miller doing well postop working with PT OT recommending skilled nursing facility. He has baseline moderate dementia pleasantly confused. He has had low-grade postop fever with hypotension intermittently, also had acute blood loss on chronic anemia .  Subjective: Seen and examined.  He is alert awake oriented to self, no complaints, mittens in place.    Assessment and Plan: Principal Problem:   Closed right hip fracture (HCC) Active Problems:   Dementia without behavioral disturbance (HCC)   Hypotension   Chronic anemia  Right periprosthetic hip fracture:  underwent right hip hemiarthroplasty revision on 05/30/2022 by Dr. Percell Miller doing well postop working with PT OT recommending skilled nursing facility  Moderate dementia: Patient is alert awake oriented x 1 only.  Continue on delirium precaution, continue supportive care fall precaution.  Low-grade postop fever with hypotension: Had low-grade fever of 100.5 around 3:30 AM on 06/01/2022 and blood pressure was 74/46, lactic acid normal, he was given IV fluids, blood pressure improved.  Febrile overnight 12/28 and afebrile since, CBC 12/29 stable  Chest x-ray no acute finding.  Negative respiratory virus panel.  Acute blood loss in the setting of chronic anemia transfuse for less than 7 g and monitor Recent Labs  Lab 06/02/22 0910 06/03/22 0242 06/05/22 0959  HGB 8.4* 8.3* 9.3*  HCT 25.4* 24.4* 28.6*   DVT prophylaxis:  enoxaparin (LOVENOX) injection 40 mg Start: 05/31/22 0800 SCDs Start: 05/30/22 1515 SCDs Start: 05/29/22 0228 Code Status:   Code Status: Full Code Family Communication: plan of care discussed with patient at bedside. Called his wife Sunday Spillers and updated 12/31. She had some foot injury and has not been visiting him.  Patient status is: Inpatient because of awaiting placement Level of care: Med-Surg   Dispo: Anticipated disposition:SNF Objective: Vitals last 24 hrs: Vitals:   06/06/22 2012 06/07/22 0434 06/07/22 2247 06/08/22 0929  BP: (!) 142/77 101/67 125/75 111/72  Pulse:  90 81 78  Resp: 20 18 16 16   Temp: 97.9 F (36.6 C) 98.8 F (37.1 C) 99.1 F (37.3 C) 98.1 F (36.7 C)  TempSrc: Oral Oral Oral Axillary  SpO2: 100% 98% 100% 100%  Weight:      Height:       Weight change:   Physical Examination: General exam: AAox1, weak,older appearing HEENT:Oral mucosa moist, Ear/Nose WNL grossly, dentition normal. Respiratory system: bilaterally diminished BS,no use of accessory muscle Cardiovascular system: S1 & S2 +, regular rate. Gastrointestinal system: Abdomen soft,NT,ND,BS+ Nervous System:Alert, awake, moving extremities and grossly nonfocal Extremities: LE ankle edema neg, rt hip incision site with Aquacel dressing, lower extremities warm Skin: No rashes,no icterus. MSK: Normal muscle bulk,tone, power   Medications reviewed:  Scheduled Meds:  docusate  100 mg Oral BID   enoxaparin (LOVENOX) injection  40 mg Subcutaneous Q24H   feeding supplement (KATE FARMS STANDARD 1.4)  325 mL Oral BID BM   multivitamin with minerals  1 tablet Oral Daily   Continuous Infusions:  sodium chloride 50 mL/hr at 06/07/22 1416   methocarbamol (ROBAXIN) IV      Diet Order  Diet vegetarian Room service appropriate? Yes; Fluid consistency: Thin  Diet effective now                  Intake/Output Summary (Last 24 hours) at 06/08/2022 1110 Last data filed at 06/08/2022  0500 Gross per 24 hour  Intake --  Output 1000 ml  Net -1000 ml    Net IO Since Admission: -4,583.72 mL [06/08/22 1110]  Wt Readings from Last 3 Encounters:  05/30/22 66.4 kg  08/05/21 63.5 kg  07/27/21 63.7 kg     Unresulted Labs (From admission, onward)     Start     Ordered   06/06/22 0500  Creatinine, serum  (enoxaparin (LOVENOX)  CrCl >/= 30 mL/min  )  Weekly,   R     Comments: while on enoxaparin therapy.    05/30/22 1514          Data Reviewed: I have personally reviewed following labs and imaging studies CBC: Recent Labs  Lab 06/02/22 0910 06/03/22 0242 06/05/22 0959  WBC 7.2 7.1 5.8  NEUTROABS 5.7 5.0  --   HGB 8.4* 8.3* 9.3*  HCT 25.4* 24.4* 28.6*  MCV 85.2 84.4 84.9  PLT 144* 146* 221    Basic Metabolic Panel: Recent Labs  Lab 06/02/22 0910 06/05/22 0959 06/06/22 0556  NA 141 139  --   K 3.8 3.9  --   CL 111 105  --   CO2 23 28  --   GLUCOSE 121* 104*  --   BUN 14 9  --   CREATININE 0.92 1.01 1.00  CALCIUM 8.2* 8.4*  --     GFR: Estimated Creatinine Clearance: 49.8 mL/min (by C-G formula based on SCr of 1 mg/dL). Liver Function Tests: No results for input(s): "AST", "ALT", "ALKPHOS", "BILITOT", "PROT", "ALBUMIN" in the last 168 hours.  Recent Results (from the past 240 hour(s))  Surgical pcr screen     Status: None   Collection Time: 05/30/22  4:18 AM   Specimen: Nasal Mucosa; Nasal Swab  Result Value Ref Range Status   MRSA, PCR NEGATIVE NEGATIVE Final   Staphylococcus aureus NEGATIVE NEGATIVE Final    Comment: (NOTE) The Xpert SA Assay (FDA approved for NASAL specimens in patients 50 years of age and older), is one component of a comprehensive surveillance program. It is not intended to diagnose infection nor to guide or monitor treatment. Performed at [facility], [address], [place], [place] [address]   Respiratory (~20 pathogens) panel by PCR     Status: None   Collection Time: 06/06/22  6:00 PM   Specimen:  Nasopharyngeal Swab; Respiratory  Result Value Ref Range Status   Adenovirus NOT DETECTED NOT DETECTED Final   Coronavirus 229E NOT DETECTED NOT DETECTED Final    Comment: (NOTE) The Coronavirus on the Respiratory Panel, DOES NOT test for the novel  Coronavirus (2019 nCoV)    Coronavirus HKU1 NOT DETECTED NOT DETECTED Final   Coronavirus NL63 NOT DETECTED NOT DETECTED Final   Coronavirus OC43 NOT DETECTED NOT DETECTED Final   Metapneumovirus NOT DETECTED NOT DETECTED Final   Rhinovirus / Enterovirus NOT DETECTED NOT DETECTED Final   Influenza A NOT DETECTED NOT DETECTED Final   Influenza B NOT DETECTED NOT DETECTED Final   Parainfluenza Virus 1 NOT DETECTED NOT DETECTED Final   Parainfluenza Virus 2 NOT DETECTED NOT DETECTED Final   Parainfluenza Virus 3 NOT DETECTED NOT DETECTED Final   Parainfluenza Virus 4 NOT DETECTED NOT DETECTED Final  Respiratory Syncytial Virus NOT DETECTED NOT DETECTED Final   Bordetella pertussis NOT DETECTED NOT DETECTED Final   Bordetella Parapertussis NOT DETECTED NOT DETECTED Final   Chlamydophila pneumoniae NOT DETECTED NOT DETECTED Final   Mycoplasma pneumoniae NOT DETECTED NOT DETECTED Final    Comment: Performed at [facility], [place] [address] [place], [place] [address]    Antimicrobials: Anti-infectives (From admission, onward)    Start     Dose/Rate Route Frequency Ordered Stop   05/30/22 1700  ceFAZolin (ANCEF) IVPB 2g/100 mL premix        2 g 200 mL/hr over 30 Minutes Intravenous Every 6 hours 05/30/22 1514 05/31/22 0242   05/30/22 0817  ceFAZolin (ANCEF) 2-4 GM/100ML-% IVPB       Note to Pharmacy: Nyoka Cowden D: cabinet override      05/30/22 0817 05/30/22 1145   05/30/22 0745  ceFAZolin (ANCEF) IVPB 2g/100 mL premix        2 g 200 mL/hr over 30 Minutes Intravenous On call to O.R. 05/30/22 0647 05/30/22 1135      Culture/Microbiology No results found for: "SDES", "SPECREQUEST", "CULT", "REPTSTATUS"   radiology  Studies: No results found.   LOS: 11 days   Antonieta Pert, MD [facility]  06/08/2022, 11:10 AM

## 2022-06-08 NOTE — Plan of Care (Signed)
  Problem: Education: Goal: Knowledge of General Education information will improve Description: Including pain rating scale, medication(s)/side effects and non-pharmacologic comfort measures Outcome: Not Progressing   Problem: Health Behavior/Discharge Planning: Goal: Ability to manage health-related needs will improve Outcome: Not Progressing  PATIENT IS CONFUSED

## 2022-06-08 NOTE — Plan of Care (Signed)

## 2022-06-08 NOTE — TOC Progression Note (Signed)
Transition of Care [facility]) - Progression Note    Patient Details  Name: Edwin [name] MRN: [id number] Date of Birth: [birth year]  [facility] CM/SW Contact  Joanne Chars, LCSW Phone Number: 06/08/2022, 1:23 PM  Clinical Narrative:   Message from [facility].  Pt Josem Kaufmann has been approved.  She has to check with administrator if they can admit him today.  1300: CSW has not received response, reached out to [facility] again to see if they can admit pt today.     Expected Discharge Plan: [place] Barriers to Discharge: Continued Medical Work up, SNF Pending bed offer  Expected Discharge Plan and Services In-house Referral: Clinical Social Work   Post Acute Care Choice: [place] Living arrangements for the past 2 months: Single Family Home                                       Social Determinants of Health (SDOH) Interventions SDOH Screenings   Tobacco Use: Low Risk  (06/02/2022)    Readmission Risk Interventions     No data to display

## 2022-06-08 NOTE — Discharge Summary (Signed)
Physician Discharge Summary  Edwin [name] ZOX:[id number] DOB: 11-Mar-1934 DOA: 05/28/2022  PCP: Sherle Poe, DO  Admit date: 05/28/2022 Discharge date: 06/08/2022 Recommendations for Outpatient Follow-up:  Follow up with PCP in 1 weeks-call for appointment Please obtain BMP/CBC in one week  Discharge Dispo: SNF Discharge Condition: Stable Code Status:   Code Status: Full Code Diet recommendation:  Diet Order             Diet vegetarian Room service appropriate? 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 Mardelle Matte was consulted and underwent right hip hemiarthroplasty revision on 05/30/2022 by Dr. Percell Miller doing well postop working with PT OT recommending skilled nursing facility. He has baseline moderate dementia pleasantly confused. He has had low-grade postop fever with hypotension intermittently, also had acute blood loss on chronic anemia .  He is medically stable discharge to skilled nursing facility.  Discharge Diagnoses:  Principal Problem:   Closed right hip fracture (HCC) Active Problems:   Dementia without behavioral disturbance (HCC)   Hypotension   Chronic anemia  Right periprosthetic hip fracture:  underwent right hip hemiarthroplasty revision on 05/30/2022 by Dr. Percell Miller doing well postop working with PT OT recommending skilled nursing facility   Moderate dementia: Patient is alert awake oriented x 1 only.  Continue on delirium precaution, continue supportive care fall precaution.   Low-grade postop fever with hypotension: Had low-grade fever of 100.5 around 3:30 AM on 06/01/2022 and blood pressure was 74/46, lactic acid  normal, he was given IV fluids, blood pressure improved.  Febrile overnight 12/28 and afebrile since, CBC 12/29 stable  Chest x-ray no acute finding.  Negative respiratory virus panel.   Acute blood loss in the setting of chronic anemia transfuse for less than 7 g and monitor Recent Labs  Lab 06/02/22 0910 06/03/22 0242 06/05/22 0959  HGB 8.4* 8.3* 9.3*  HCT 25.4* 24.4* 28.6*    Consults: ortho Subjective: AaoX1, no complaints  Discharge Exam: Vitals:   06/07/22 2247 06/08/22 0929  BP: 125/75 111/72  Pulse: 81 78  Resp: 16 16  Temp: 99.1 F (37.3 C) 98.1 F (36.7 C)  SpO2: 100% 100%   General: Pt is alert, awake oriented x1, not in acute distress Cardiovascular: RRR, S1/S2 +, no rubs, no gallops Respiratory: CTA bilaterally, no wheezing, no rhonchi Abdominal: Soft, NT, ND, bowel sounds + Extremities: no edema, no cyanosis  Discharge Instructions  Discharge Instructions     Discharge instructions   Complete by: As directed    Follow-up with orthopedics for wound check in 2 weeks postop  Please call call MD or return to ER for similar or worsening recurring problem that brought you to hospital or if any fever,nausea/vomiting,abdominal pain, uncontrolled pain, chest pain,  shortness of breath or any other alarming symptoms.  Please follow-up your doctor as instructed in a week time and call the office for appointment.  Please avoid alcohol, smoking, or any other illicit substance and maintain healthy habits including taking your regular medications as prescribed.  You were cared for by a hospitalist during your hospital stay. If you have any questions about your discharge medications or the care you received  while you were in the hospital after you are discharged, you can call the unit and ask to speak with the hospitalist on call if the hospitalist that took care of you is not available.  Once you are discharged, your primary care physician will handle any further  medical issues. Please note that NO REFILLS for any discharge medications will be authorized once you are discharged, as it is imperative that you return to your primary care physician (or establish a relationship with a primary care physician if you do not have one) for your aftercare needs so that they can reassess your need for medications and monitor your lab values   Discharge wound care:   Complete by: As directed    Reinforce Aquacel dressing as needed until seen by orthopedics 2 weeks postop   Increase activity slowly   Complete by: As directed       Allergies as of 06/08/2022       Reactions   Milk-related Compounds Other (See Comments)   NO DAIRY, NO YOGURT, NO ICE CREAM, NO CHEESE, NO MILK (PREFERENCE)   Beef-derived Products Other (See Comments)   Requested by wife.   Eggs Or Egg-derived Products Other (See Comments)   Patient wife states they are vegetarian, eggs are not apart of their diet.    Other Other (See Comments)   Unnamed antibiotic- "Made him sick"- no further elaboration Patient prefers to not take medication, in general        Medication List     STOP taking these medications    acetaminophen 500 MG tablet Commonly known as: TYLENOL   aspirin EC 81 MG tablet       TAKE these medications    docusate 50 MG/5ML liquid Commonly known as: COLACE Take 10 mLs (100 mg total) by mouth 2 (two) times daily.   enoxaparin 40 MG/0.4ML injection Commonly known as: LOVENOX Inject 0.4 mLs (40 mg total) into the skin daily for 18 days.   feeding supplement (KATE FARMS STANDARD 1.4) Liqd liquid Take 325 mLs by mouth 2 (two) times daily between meals.   HYDROcodone-acetaminophen 5-325 MG tablet Commonly known as: NORCO/VICODIN Take 1 tablet by mouth every 6 (six) hours as needed for moderate pain (pain score 4-6).   multivitamin with minerals Tabs tablet Take 1 tablet by mouth daily. Start taking on: June 09, 2022               Discharge Care  Instructions  (From admission, onward)           Start     Ordered   06/08/22 0000  Discharge wound care:       Comments: Reinforce Aquacel dressing as needed until seen by orthopedics 2 weeks postop   06/08/22 1146            Contact information for follow-up providers     Renette Butters, MD. Schedule an appointment as soon as possible for a visit in 2 week(s).   Specialty: Orthopedic Surgery Contact information: [address] [place] [address] [phone]         Sherle Poe, DO Follow up in 1 week(s).   Specialty: Family Medicine             Contact information for after-discharge care     Destination     [facility] .   Service: Skilled Chiropodist information: [address] [place] [phone]  Allergies  Allergen Reactions   Milk-Related Compounds Other (See Comments)    NO DAIRY, NO YOGURT, NO ICE CREAM, NO CHEESE, NO MILK (PREFERENCE)   Beef-Derived Products Other (See Comments)    Requested by wife.   Eggs Or Egg-Derived Products Other (See Comments)    Patient wife states they are vegetarian, eggs are not apart of their diet.    Other Other (See Comments)    Unnamed antibiotic- "Made him sick"- no further elaboration  Patient prefers to not take medication, in general    The results of significant diagnostics from this hospitalization (including imaging, microbiology, ancillary and laboratory) are listed below for reference.    Microbiology: Recent Results (from the past 240 hour(s))  Surgical pcr screen     Status: None   Collection Time: 05/30/22  4:18 AM   Specimen: Nasal Mucosa; Nasal Swab  Result Value Ref Range Status   MRSA, PCR NEGATIVE NEGATIVE Final   Staphylococcus aureus NEGATIVE NEGATIVE Final    Comment: (NOTE) The Xpert SA Assay (FDA approved for NASAL specimens in patients 87 years of age and older), is one  component of a comprehensive surveillance program. It is not intended to diagnose infection nor to guide or monitor treatment. Performed at [facility], [place] [address], [place], [place] [address]   Respiratory (~20 pathogens) panel by PCR     Status: None   Collection Time: 06/06/22  6:00 PM   Specimen: Nasopharyngeal Swab; Respiratory  Result Value Ref Range Status   Adenovirus NOT DETECTED NOT DETECTED Final   Coronavirus 229E NOT DETECTED NOT DETECTED Final    Comment: (NOTE) The Coronavirus on the Respiratory Panel, DOES NOT test for the novel  Coronavirus (2019 nCoV)    Coronavirus HKU1 NOT DETECTED NOT DETECTED Final   Coronavirus NL63 NOT DETECTED NOT DETECTED Final   Coronavirus OC43 NOT DETECTED NOT DETECTED Final   Metapneumovirus NOT DETECTED NOT DETECTED Final   Rhinovirus / Enterovirus NOT DETECTED NOT DETECTED Final   Influenza A NOT DETECTED NOT DETECTED Final   Influenza B NOT DETECTED NOT DETECTED Final   Parainfluenza Virus 1 NOT DETECTED NOT DETECTED Final   Parainfluenza Virus 2 NOT DETECTED NOT DETECTED Final   Parainfluenza Virus 3 NOT DETECTED NOT DETECTED Final   Parainfluenza Virus 4 NOT DETECTED NOT DETECTED Final   Respiratory Syncytial Virus NOT DETECTED NOT DETECTED Final   Bordetella pertussis NOT DETECTED NOT DETECTED Final   Bordetella Parapertussis NOT DETECTED NOT DETECTED Final   Chlamydophila pneumoniae NOT DETECTED NOT DETECTED Final   Mycoplasma pneumoniae NOT DETECTED NOT DETECTED Final    Comment: Performed at [facility], [place]. [address], [place], [place] [address]    Procedures/Studies: DG Chest Port 1 View  Result Date: 06/05/2022 CLINICAL DATA:  Fever EXAM: PORTABLE CHEST 1 VIEW COMPARISON:  Chest radiograph 05/27/2022 FINDINGS: The heart is enlarged. The upper mediastinum is prominent measuring up to 10.4 cm transverse. There is no focal consolidation or pulmonary edema. There is no pleural effusion or pneumothorax.  Overall, no significant interval change in lung aeration. There is no acute osseous abnormality. IMPRESSION: 1. No significant interval change in lung aeration since 03/28/2022. No new or worsening focal airspace disease. 2. Prominent upper mediastinum. Consider nonemergent CT chest to evaluate for aortic aneurysm. Electronically Signed   By: Valetta Mole M.D.   On: 06/05/2022 10:59   Pelvis Portable  Result Date: 05/30/2022 CLINICAL DATA:  History of right hip hemiarthroplasty EXAM:  PORTABLE PELVIS 1-2 VIEWS COMPARISON:  Radiograph 05/28/2022 FINDINGS: Postoperative images during right hip arthroplasty revision with cerclage wire fixation of the proximal femur for a periprosthetic fracture. Improved fracture alignment. Hardware is intact without evidence of immediate complication. Expected soft tissue changes. IMPRESSION: Postsurgical changes of right hip arthroplasty revision and cerclage wire fixation of the proximal femur for a periprosthetic fracture. Improved fracture alignment. No evidence of immediate complication. Electronically Signed   By: Maurine Simmering M.D.   On: 05/30/2022 15:45   DG HIP UNILAT WITH PELVIS 1V RIGHT  Result Date: 05/30/2022 CLINICAL DATA:  Fluoroscopic assistance for internal fixation EXAM: DG HIP (WITH OR WITHOUT PELVIS) 1V RIGHT COMPARISON:  Previous studies including the examination done on 05/28/2022 FINDINGS: There is interval removal of arthroplasty hardware. There is interval reduction and internal fixation of displaced fracture of proximal right femur with multiple metallic wires. Fluoroscopic time 1.7 seconds. Radiation dose 0.8 mGy. IMPRESSION: Fluoroscopic assistance was provided for internal fixation of displaced fracture of proximal right femur. There is interval removal of right arthroplasty hardware. Electronically Signed   By: Elmer Picker M.D.   On: 05/30/2022 14:42   DG C-Arm 1-60 Min-No Report  Result Date: 05/30/2022 Fluoroscopy was utilized by the  requesting physician.  No radiographic interpretation.   DG Chest Portable 1 View  Result Date: 05/28/2022 CLINICAL DATA:  Hip fracture, preoperative assessment. EXAM: PORTABLE CHEST 1 VIEW COMPARISON:  07/26/2021. FINDINGS: The heart is enlarged and the mediastinal contour is stable. No consolidation, effusion, or pneumothorax. No acute osseous abnormality. IMPRESSION: No active disease. Electronically Signed   By: Brett Fairy M.D.   On: 05/28/2022 22:37   DG Hip Unilat W or Wo Pelvis 2-3 Views Right  Result Date: 05/28/2022 CLINICAL DATA:  Insert streak EXAM: DG HIP (WITH OR WITHOUT PELVIS) 2-3V RIGHT COMPARISON:  X-ray right hip 07/27/2021 FINDINGS: Total right hip arthroplasty with acute periprostatic proximal femoral fracture involving the greater trochanter and proximal shaft. No dislocation of the surgical hardware. Frontal view left hip demonstrates no acute displaced fracture or dislocation. Mild degenerative changes of the left hip. No acute displaced fracture or diastasis of the bones of the pelvis. IMPRESSION: Total right hip arthroplasty with acute periprostatic proximal femoral fracture involving the greater trochanter and proximal shaft. Electronically Signed   By: Iven Finn M.D.   On: 05/28/2022 22:11    Labs: BNP (last 3 results) No results for input(s): "BNP" in the last 8760 hours. Basic Metabolic Panel: Recent Labs  Lab 06/02/22 0910 06/05/22 0959 06/06/22 0556  NA 141 139  --   K 3.8 3.9  --   CL 111 105  --   CO2 23 28  --   GLUCOSE 121* 104*  --   BUN 14 9  --   CREATININE 0.92 1.01 1.00  CALCIUM 8.2* 8.4*  --    Liver Function Tests: No results for input(s): "AST", "ALT", "ALKPHOS", "BILITOT", "PROT", "ALBUMIN" in the last 168 hours. No results for input(s): "LIPASE", "AMYLASE" in the last 168 hours. No results for input(s): "AMMONIA" in the last 168 hours. CBC: Recent Labs  Lab 06/02/22 0910 06/03/22 0242 06/05/22 0959  WBC 7.2 7.1 5.8   NEUTROABS 5.7 5.0  --   HGB 8.4* 8.3* 9.3*  HCT 25.4* 24.4* 28.6*  MCV 85.2 84.4 84.9  PLT 144* 146* 221   Cardiac Enzymes: No results for input(s): "CKTOTAL", "CKMB", "CKMBINDEX", "TROPONINI" in the last 168 hours. BNP: Invalid input(s): "POCBNP" CBG: No results for  input(s): "GLUCAP" in the last 168 hours. D-Dimer No results for input(s): "DDIMER" in the last 72 hours. Hgb A1c No results for input(s): "HGBA1C" in the last 72 hours. Lipid Profile No results for input(s): "CHOL", "HDL", "LDLCALC", "TRIG", "CHOLHDL", "LDLDIRECT" in the last 72 hours. Thyroid function studies No results for input(s): "TSH", "T4TOTAL", "T3FREE", "THYROIDAB" in the last 72 hours.  Invalid input(s): "FREET3" Anemia work up No results for input(s): "VITAMINB12", "FOLATE", "FERRITIN", "TIBC", "IRON", "RETICCTPCT" in the last 72 hours. Urinalysis    Component Value Date/Time   COLORURINE AMBER (A) 08/05/2021 1942   APPEARANCEUR CLOUDY (A) 08/05/2021 1942   LABSPEC 1.013 08/05/2021 1942   PHURINE 6.0 08/05/2021 1942   GLUCOSEU NEGATIVE 08/05/2021 1942   HGBUR NEGATIVE 08/05/2021 1942   BILIRUBINUR NEGATIVE 08/05/2021 1942   KETONESUR NEGATIVE 08/05/2021 1942   PROTEINUR NEGATIVE 08/05/2021 1942   NITRITE NEGATIVE 08/05/2021 1942   LEUKOCYTESUR NEGATIVE 08/05/2021 1942   Sepsis Labs Recent Labs  Lab 06/02/22 0910 06/03/22 0242 06/05/22 0959  WBC 7.2 7.1 5.8   Microbiology Recent Results (from the past 240 hour(s))  Surgical pcr screen     Status: None   Collection Time: 05/30/22  4:18 AM   Specimen: Nasal Mucosa; Nasal Swab  Result Value Ref Range Status   MRSA, PCR NEGATIVE NEGATIVE Final   Staphylococcus aureus NEGATIVE NEGATIVE Final    Comment: (NOTE) The Xpert SA Assay (FDA approved for NASAL specimens in patients 43 years of age and older), is one component of a comprehensive surveillance program. It is not intended to diagnose infection nor to guide or monitor  treatment. Performed at [facility], [place] [address] [place], [place] [address]   Respiratory (~20 pathogens) panel by PCR     Status: None   Collection Time: 06/06/22  6:00 PM   Specimen: Nasopharyngeal Swab; Respiratory  Result Value Ref Range Status   Adenovirus NOT DETECTED NOT DETECTED Final   Coronavirus 229E NOT DETECTED NOT DETECTED Final    Comment: (NOTE) The Coronavirus on the Respiratory Panel, DOES NOT test for the novel  Coronavirus (2019 nCoV)    Coronavirus HKU1 NOT DETECTED NOT DETECTED Final   Coronavirus NL63 NOT DETECTED NOT DETECTED Final   Coronavirus OC43 NOT DETECTED NOT DETECTED Final   Metapneumovirus NOT DETECTED NOT DETECTED Final   Rhinovirus / Enterovirus NOT DETECTED NOT DETECTED Final   Influenza A NOT DETECTED NOT DETECTED Final   Influenza B NOT DETECTED NOT DETECTED Final   Parainfluenza Virus 1 NOT DETECTED NOT DETECTED Final   Parainfluenza Virus 2 NOT DETECTED NOT DETECTED Final   Parainfluenza Virus 3 NOT DETECTED NOT DETECTED Final   Parainfluenza Virus 4 NOT DETECTED NOT DETECTED Final   Respiratory Syncytial Virus NOT DETECTED NOT DETECTED Final   Bordetella pertussis NOT DETECTED NOT DETECTED Final   Bordetella Parapertussis NOT DETECTED NOT DETECTED Final   Chlamydophila pneumoniae NOT DETECTED NOT DETECTED Final   Mycoplasma pneumoniae NOT DETECTED NOT DETECTED Final    Comment: Performed at [facility], [place]. [address] [place], [place] [address]     Time coordinating discharge: 25 minutes  SIGNED: Antonieta Pert, MD  [facility] 06/08/2022, 11:46 AM  If 7PM-7AM, please contact night-coverage [email or web address]

## 2022-06-09 DIAGNOSIS — S72001A Fracture of unspecified part of neck of right femur, initial encounter for closed fracture: Secondary | ICD-10-CM | POA: Diagnosis not present

## 2022-06-09 DIAGNOSIS — F0390 Unspecified dementia without behavioral disturbance: Secondary | ICD-10-CM | POA: Diagnosis not present

## 2022-06-09 NOTE — TOC Transition Note (Signed)
Transition of Care [facility]) - CM/SW Discharge Note   Patient Details  Name: Edwin [name] MRN: [id number] Date of Birth: 1933/12/05  [facility] CM/SW Contact:  Joanne Chars, LCSW Phone Number: 06/09/2022, 1:35 PM   Clinical Narrative:   PT discharging to [facility].  RN call report to [phone].    1100: CSW confirmed with [facility] that they can receive pt today.    Final next level of care: Skilled Nursing Facility Barriers to Discharge: Barriers Resolved   Patient Goals and CMS Choice CMS Medicare.gov Compare Post Acute Care list provided to:: Patient Represenative (must comment) (wife Edwin [name]) Choice offered to / list presented to : Spouse  Discharge Placement                Patient chooses bed at:  [name]) Patient to be transferred to facility by: [name] Name of family member notified: wife Edwin [name] Patient and family notified of of transfer: 06/09/22  Discharge Plan and Services Additional resources added to the After Visit Summary for   In-house Referral: Clinical Social Work   Post Acute Care Choice: [facility]                               Social Determinants of Health ([place]) Interventions SDOH Screenings   Tobacco Use: Low Risk  (06/02/2022)     Readmission Risk Interventions     No data to display

## 2022-06-09 NOTE — Progress Notes (Signed)
Tried Insurance risk surveyor at [employer] to give report. No answer will call back later.

## 2022-06-09 NOTE — Discharge Summary (Signed)
Physician Discharge Summary   Edwin [name] [id number] DOB: 1933/12/19 DOA: 05/28/2022  PCP: Edwin Poe, [name]  Admit date: 05/28/2022 Discharge date:  06/09/2022 Barriers to discharge:   Admitted From: Home Disposition:  SNF Discharging physician: Edwin Dee, [name]  Recommendations for Outpatient Follow-up:  Follow up with ortho Check BMP/CBC in 1 week   Discharge Condition: stable CODE STATUS: Full Diet recommendation:  Diet Orders (From admission, onward)     Start     Ordered   06/03/22 1333  Diet vegetarian Room service appropriate? Yes; Fluid consistency: Thin  Diet effective now       Question Answer Comment  Room service appropriate? 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  He presented s/p mechanical fall  and difficulty bearing weight and was found to have a periprosthetic fracture Orthopedics was consulted and underwent right hip hemiarthroplasty revision on 05/30/2022 by Edwin [name]. PT recommended SNF post top. He has baseline moderate dementia pleasantly confused.  Assessment and Plan:  Right periprosthetic hip fracture:  underwent right hip hemiarthroplasty revision on 05/30/2022 by Edwin [name] doing well postop working with PT OT recommending skilled nursing facility   Moderate dementia: Patient is alert awake oriented x 1 only.  Continue on delirium precaution, continue supportive care fall precaution.   Low-grade postop fever with hypotension: Had low-grade fever of 100.5 around 3:30 AM on 06/01/2022 and blood pressure was 74/46, lactic acid normal, he was given IV fluids, blood pressure improved.  Febrile overnight 12/28 and afebrile since, CBC 12/29 stable  Chest x-ray no acute finding.  Negative respiratory virus panel. - suspect some atelectasis  postop - continue PT/OT   Acute blood loss in the setting of chronic anemia  - Hgb stable   Principal Diagnosis: Closed right hip fracture [facility])  Discharge Diagnoses: Active Hospital Problems   Diagnosis Date Noted   Closed right hip fracture ([facility]) 07/26/2021   Hypotension 06/01/2022   Chronic anemia 06/01/2022   Dementia without behavioral disturbance ([facility]) 07/26/2021    Resolved Hospital Problems  No resolved problems to display.     Discharge Instructions     Discharge instructions   Complete by: As directed    Follow-up with orthopedics for wound check in 2 weeks postop  Please call call [name] or return to ER for similar or worsening recurring problem that brought you to hospital or if any fever,nausea/vomiting,abdominal pain, uncontrolled pain, chest pain,  shortness of breath or any other alarming symptoms.  Please follow-up your doctor as instructed in a week time and call the office for appointment.  Please avoid alcohol, smoking, or any other illicit substance and maintain healthy habits including taking your regular medications as prescribed.  You were cared for by a hospitalist during your hospital stay. If you have any questions about your discharge medications or the care you received while you were in the hospital after you are discharged, you can call the unit and ask to speak with the hospitalist on call if the hospitalist that took care of you is not available.  Once you are discharged, your primary care physician will handle any further medical issues. Please note that NO REFILLS for any discharge medications will be authorized once you are discharged, as it is imperative that  you return to your primary care physician (or establish a relationship with a primary care physician if you [name] not have one) for your aftercare needs so that they can reassess your need for medications and monitor your lab values   Discharge wound care:   Complete by: As directed     Reinforce Aquacel dressing as needed until seen by orthopedics 2 weeks postop   Increase activity slowly   Complete by: As directed       Allergies as of 06/09/2022       Reactions   Milk-related Compounds Other (See Comments)   NO DAIRY, NO YOGURT, NO ICE CREAM, NO CHEESE, NO MILK (PREFERENCE)   Beef-derived Products Other (See Comments)   Requested by wife.   Eggs Or Egg-derived Products Other (See Comments)   Patient wife states they are vegetarian, eggs are not apart of their diet.    Other Other (See Comments)   Unnamed antibiotic- "Made him sick"- no further elaboration Patient prefers to not take medication, in general        Medication List     STOP taking these medications    acetaminophen 500 MG tablet Commonly known as: TYLENOL   aspirin EC 81 MG tablet       TAKE these medications    docusate 50 MG/5ML liquid Commonly known as: COLACE Take 10 mLs (100 mg total) by mouth 2 (two) times daily.   enoxaparin 40 MG/0.4ML injection Commonly known as: LOVENOX Inject 0.4 mLs (40 mg total) into the skin daily for 18 days.   feeding supplement (KATE FARMS STANDARD 1.4) Liqd liquid Take 325 mLs by mouth 2 (two) times daily between meals.   HYDROcodone-acetaminophen 5-325 MG tablet Commonly known as: NORCO/VICODIN Take 1 tablet by mouth every 6 (six) hours as needed for moderate pain (pain score 4-6).   multivitamin with minerals Tabs tablet Take 1 tablet by mouth daily.               Discharge Care Instructions  (From admission, onward)           Start     Ordered   06/08/22 0000  Discharge wound care:       Comments: Reinforce Aquacel dressing as needed until seen by orthopedics 2 weeks postop   06/08/22 1146            Contact information for follow-up providers     Edwin [name]. Schedule an appointment as soon as possible for a visit in 2 week(s).   Specialty: Orthopedic Surgery Contact information: [address] [place] [phone]         Edwin [name] Follow up in 1 week(s).   Specialty: Family Medicine             Contact information for after-discharge care     Destination     [facility] .   Service: Skilled Nursing Contact information: [address] [place] [phone]                    Allergies  Allergen Reactions   Milk-Related Compounds Other (See Comments)    NO DAIRY, NO YOGURT, NO ICE CREAM, NO CHEESE, NO MILK (PREFERENCE)   Beef-Derived Products Other (See Comments)    Requested by wife.   Eggs Or Egg-Derived Products Other (See Comments)    Patient wife states they are vegetarian, eggs are not apart of their diet.  Other Other (See Comments)    Unnamed antibiotic- "Made him sick"- no further elaboration  Patient prefers to not take medication, in general    Consultations: Orthopedic surgery  Procedures: 05/30/22: RIGHT HIP HEMIARTHROPLASTY REVISION   Discharge Exam: BP 105/71   Pulse 73   Temp 98.6 F (37 C) (Oral)   Resp 16   Ht 5\' 11"  (1.803 m)   Wt 66.4 kg   SpO2 91%   BMI 20.42 kg/m  Physical Exam Constitutional:      General: He is not in acute distress.    Appearance: He is not ill-appearing.     Comments: Pleasantly demented  HENT:     Head: Normocephalic and atraumatic.     Mouth/Throat:     Mouth: Mucous membranes are moist.  Eyes:     Extraocular Movements: Extraocular movements intact.  Cardiovascular:     Rate and Rhythm: Normal rate and regular rhythm.  Pulmonary:     Effort: Pulmonary effort is normal.     Breath sounds: Normal breath sounds.  Abdominal:     General: Bowel sounds are normal. There is no distension.     Palpations: Abdomen is soft.     Tenderness: There is no abdominal tenderness.  Musculoskeletal:     Cervical back: Normal range of motion and neck supple.     Comments: Right hip surgical dressing in  place; compartments soft  Skin:    General: Skin is warm and dry.  Neurological:     Mental Status: Mental status is at baseline. He is disoriented.     Comments: Dementia appreciated      The results of significant diagnostics from this hospitalization (including imaging, microbiology, ancillary and laboratory) are listed below for reference.   Microbiology: Recent Results (from the past 240 hour(s))  Respiratory (~20 pathogens) panel by PCR     Status: None   Collection Time: 06/06/22  6:00 PM   Specimen: Nasopharyngeal Swab; Respiratory  Result Value Ref Range Status   Adenovirus NOT DETECTED NOT DETECTED Final   Coronavirus 229E NOT DETECTED NOT DETECTED Final    Comment: (NOTE) The Coronavirus on the Respiratory Panel, DOES NOT test for the novel  Coronavirus (2019 nCoV)    Coronavirus HKU1 NOT DETECTED NOT DETECTED Final   Coronavirus NL63 NOT DETECTED NOT DETECTED Final   Coronavirus OC43 NOT DETECTED NOT DETECTED Final   Metapneumovirus NOT DETECTED NOT DETECTED Final   Rhinovirus / Enterovirus NOT DETECTED NOT DETECTED Final   Influenza A NOT DETECTED NOT DETECTED Final   Influenza B NOT DETECTED NOT DETECTED Final   Parainfluenza Virus 1 NOT DETECTED NOT DETECTED Final   Parainfluenza Virus 2 NOT DETECTED NOT DETECTED Final   Parainfluenza Virus 3 NOT DETECTED NOT DETECTED Final   Parainfluenza Virus 4 NOT DETECTED NOT DETECTED Final   Respiratory Syncytial Virus NOT DETECTED NOT DETECTED Final   Bordetella pertussis NOT DETECTED NOT DETECTED Final   Bordetella Parapertussis NOT DETECTED NOT DETECTED Final   Chlamydophila pneumoniae NOT DETECTED NOT DETECTED Final   Mycoplasma pneumoniae NOT DETECTED NOT DETECTED Final    Comment: Performed at [facility], [place]. [address] [place], [place] [address]     Labs: BNP (last 3 results) No results for input(s): "BNP" in the last 8760 hours. Basic Metabolic Panel: Recent Labs  Lab 06/05/22 0959 06/06/22 0556   NA 139  --   K 3.9  --   CL 105  --   CO2 28  --  GLUCOSE 104*  --   BUN 9  --   CREATININE 1.01 1.00  CALCIUM 8.4*  --    Liver Function Tests: No results for input(s): "AST", "ALT", "ALKPHOS", "BILITOT", "PROT", "ALBUMIN" in the last 168 hours. No results for input(s): "LIPASE", "AMYLASE" in the last 168 hours. No results for input(s): "AMMONIA" in the last 168 hours. CBC: Recent Labs  Lab 06/03/22 0242 06/05/22 0959  WBC 7.1 5.8  NEUTROABS 5.0  --   HGB 8.3* 9.3*  HCT 24.4* 28.6*  MCV 84.4 84.9  PLT 146* 221   Cardiac Enzymes: No results for input(s): "CKTOTAL", "CKMB", "CKMBINDEX", "TROPONINI" in the last 168 hours. BNP: Invalid input(s): "POCBNP" CBG: No results for input(s): "GLUCAP" in the last 168 hours. D-Dimer No results for input(s): "DDIMER" in the last 72 hours. Hgb A1c No results for input(s): "HGBA1C" in the last 72 hours. Lipid Profile No results for input(s): "CHOL", "HDL", "LDLCALC", "TRIG", "CHOLHDL", "LDLDIRECT" in the last 72 hours. Thyroid function studies No results for input(s): "TSH", "T4TOTAL", "T3FREE", "THYROIDAB" in the last 72 hours.  Invalid input(s): "FREET3" Anemia work up No results for input(s): "VITAMINB12", "FOLATE", "FERRITIN", "TIBC", "IRON", "RETICCTPCT" in the last 72 hours. Urinalysis    Component Value Date/Time   COLORURINE AMBER (A) 08/05/2021 1942   APPEARANCEUR CLOUDY (A) 08/05/2021 1942   LABSPEC 1.013 08/05/2021 1942   PHURINE 6.0 08/05/2021 1942   GLUCOSEU NEGATIVE 08/05/2021 1942   HGBUR NEGATIVE 08/05/2021 1942   BILIRUBINUR NEGATIVE 08/05/2021 1942   KETONESUR NEGATIVE 08/05/2021 1942   PROTEINUR NEGATIVE 08/05/2021 1942   NITRITE NEGATIVE 08/05/2021 1942   LEUKOCYTESUR NEGATIVE 08/05/2021 1942   Sepsis Labs Recent Labs  Lab 06/03/22 0242 06/05/22 0959  WBC 7.1 5.8   Microbiology Recent Results (from the past 240 hour(s))  Respiratory (~20 pathogens) panel by PCR     Status: None   Collection  Time: 06/06/22  6:00 PM   Specimen: Nasopharyngeal Swab; Respiratory  Result Value Ref Range Status   Adenovirus NOT DETECTED NOT DETECTED Final   Coronavirus 229E NOT DETECTED NOT DETECTED Final    Comment: (NOTE) The Coronavirus on the Respiratory Panel, DOES NOT test for the novel  Coronavirus (2019 nCoV)    Coronavirus HKU1 NOT DETECTED NOT DETECTED Final   Coronavirus NL63 NOT DETECTED NOT DETECTED Final   Coronavirus OC43 NOT DETECTED NOT DETECTED Final   Metapneumovirus NOT DETECTED NOT DETECTED Final   Rhinovirus / Enterovirus NOT DETECTED NOT DETECTED Final   Influenza A NOT DETECTED NOT DETECTED Final   Influenza B NOT DETECTED NOT DETECTED Final   Parainfluenza Virus 1 NOT DETECTED NOT DETECTED Final   Parainfluenza Virus 2 NOT DETECTED NOT DETECTED Final   Parainfluenza Virus 3 NOT DETECTED NOT DETECTED Final   Parainfluenza Virus 4 NOT DETECTED NOT DETECTED Final   Respiratory Syncytial Virus NOT DETECTED NOT DETECTED Final   Bordetella pertussis NOT DETECTED NOT DETECTED Final   Bordetella Parapertussis NOT DETECTED NOT DETECTED Final   Chlamydophila pneumoniae NOT DETECTED NOT DETECTED Final   Mycoplasma pneumoniae NOT DETECTED NOT DETECTED Final    Comment: Performed at [facility], [place] [address], [place], [place] [address]    Procedures/Studies: DG Chest Port 1 View  Result Date: 06/05/2022 CLINICAL DATA:  Fever EXAM: PORTABLE CHEST 1 VIEW COMPARISON:  Chest radiograph 05/27/2022 FINDINGS: The heart is enlarged. The upper mediastinum is prominent measuring up to 10.4 cm transverse. There is no focal consolidation or pulmonary edema. There is  no pleural effusion or pneumothorax. Overall, no significant interval change in lung aeration. There is no acute osseous abnormality. IMPRESSION: 1. No significant interval change in lung aeration since 03/28/2022. No new or worsening focal airspace disease. 2. Prominent upper mediastinum. Consider nonemergent CT chest to  evaluate for aortic aneurysm. Electronically Signed   By: Valetta Mole M.D.   On: 06/05/2022 10:59   Pelvis Portable  Result Date: 05/30/2022 CLINICAL DATA:  History of right hip hemiarthroplasty EXAM: PORTABLE PELVIS 1-2 VIEWS COMPARISON:  Radiograph 05/28/2022 FINDINGS: Postoperative images during right hip arthroplasty revision with cerclage wire fixation of the proximal femur for a periprosthetic fracture. Improved fracture alignment. Hardware is intact without evidence of immediate complication. Expected soft tissue changes. IMPRESSION: Postsurgical changes of right hip arthroplasty revision and cerclage wire fixation of the proximal femur for a periprosthetic fracture. Improved fracture alignment. No evidence of immediate complication. Electronically Signed   By: Maurine Simmering M.D.   On: 05/30/2022 15:45   DG HIP UNILAT WITH PELVIS 1V RIGHT  Result Date: 05/30/2022 CLINICAL DATA:  Fluoroscopic assistance for internal fixation EXAM: DG HIP (WITH OR WITHOUT PELVIS) 1V RIGHT COMPARISON:  Previous studies including the examination done on 05/28/2022 FINDINGS: There is interval removal of arthroplasty hardware. There is interval reduction and internal fixation of displaced fracture of proximal right femur with multiple metallic wires. Fluoroscopic time 1.7 seconds. Radiation dose 0.8 mGy. IMPRESSION: Fluoroscopic assistance was provided for internal fixation of displaced fracture of proximal right femur. There is interval removal of right arthroplasty hardware. Electronically Signed   By: Elmer Picker M.D.   On: 05/30/2022 14:42   DG C-Arm 1-60 Min-No Report  Result Date: 05/30/2022 Fluoroscopy was utilized by the requesting physician.  No radiographic interpretation.   DG Chest Portable 1 View  Result Date: 05/28/2022 CLINICAL DATA:  Hip fracture, preoperative assessment. EXAM: PORTABLE CHEST 1 VIEW COMPARISON:  07/26/2021. FINDINGS: The heart is enlarged and the mediastinal contour is  stable. No consolidation, effusion, or pneumothorax. No acute osseous abnormality. IMPRESSION: No active disease. Electronically Signed   By: Brett Fairy M.D.   On: 05/28/2022 22:37   DG Hip Unilat W or Wo Pelvis 2-3 Views Right  Result Date: 05/28/2022 CLINICAL DATA:  Insert streak EXAM: DG HIP (WITH OR WITHOUT PELVIS) 2-3V RIGHT COMPARISON:  X-ray right hip 07/27/2021 FINDINGS: Total right hip arthroplasty with acute periprostatic proximal femoral fracture involving the greater trochanter and proximal shaft. No dislocation of the surgical hardware. Frontal view left hip demonstrates no acute displaced fracture or dislocation. Mild degenerative changes of the left hip. No acute displaced fracture or diastasis of the bones of the pelvis. IMPRESSION: Total right hip arthroplasty with acute periprostatic proximal femoral fracture involving the greater trochanter and proximal shaft. Electronically Signed   By: Iven Finn M.D.   On: 05/28/2022 22:11     Time coordinating discharge: Over 30 minutes    Edwin Dee, [name]  [facility] 06/09/2022, 12:44 PM

## 2022-06-09 NOTE — Progress Notes (Signed)
Physical Therapy Treatment Patient Details Name: Edwin [name] MRN: [id number] DOB: [birth year] Today's Date: 06/09/2022   History of Present Illness Pt is an 89 y.o. male admitted 05/28/22 after mechanical fall sustaining R periprosthetic hip fx. S/p R hip hemiarthroplasty revision 12/23. PMH includes dementia, R hip hemiarthroplasty (07/2021).    PT Comments    Pt received in supine, initially alert and oriented x0-1 and with increased lethargy as session progressed. Pt requiring +2 maxA to totalA for rolling to L/R sides and totalA for hygiene assist, NT in room to assist as PTA assist to remain sidelying to his L for clean-up. Pt with some participation in supine BLE AA/PROM per HEP and once bed placed in more upright chair posture, pt falling asleep, unable to participate in feeding assist when dinner tray arrived, NT notified. Pt continues to benefit from PT services to progress toward functional mobility goals.    Recommendations for follow up therapy are one component of a multi-disciplinary discharge planning process, led by the attending physician.  Recommendations may be updated based on patient status, additional functional criteria and insurance authorization.  Follow Up Recommendations  Skilled nursing-short term rehab (<3 hours/day) Can patient physically be transported by private vehicle: No   Assistance Recommended at Discharge Frequent or constant Supervision/Assistance  Patient can return home with the following Two people to help with walking and/or transfers;Two people to help with bathing/dressing/bathroom;Assistance with feeding;Help with stairs or ramp for entrance;Assist for transportation;Direct supervision/assist for medications management;Direct supervision/assist for financial management;Assistance with cooking/housework   Equipment Recommendations  Other (comment) (defer to next venue; currently mechanical lift/hospital bed level)    Recommendations for Other  Services       Precautions / Restrictions Precautions Precautions: Fall Precaution Booklet Issued: No Precaution Comments: spouse not in room, HEP placed on tray table 12/27 to reinforce need for ROM/AAROM Knee Immobilizer - Right: Other (comment) (no instructions in ortho notes for KI so kept it off) Restrictions Weight Bearing Restrictions: Yes RLE Weight Bearing: Weight bearing as tolerated     Mobility  Bed Mobility Overal bed mobility: Needs Assistance Bed Mobility: Rolling Rolling: +2 for physical assistance, Max assist, Total assist         General bed mobility comments: pt requiring totalA +2 for rolling to R side and maxA +2 to roll to his L side. Pt maintains rigid posture throughout sidelying. Pt rolled x2 reps in each direction during peri-care, guarding due to pain, needing lots of encouragement to participate and to keep hands on rail in sidelying after multimodal cues given. Bed up in chair posture but defer EOB/OOB attempts due to increased lethargy/falling asleep and not alert enough to take bites of his dinner.    Transfers Overall transfer level: Needs assistance                 General transfer comment: anticipate totalA via maxi-move lift for OOB transfers based on totalA for bed mobility today and pt unable to follow commands, too lethargic to eat dinner even with feeding assist/max coaxing. Falling asleep.    Ambulation/Gait                      Balance Overall balance assessment: Needs assistance Sitting-balance support: No upper extremity supported, Feet unsupported Sitting balance-Leahy Scale: Zero Sitting balance - Comments: +2 totalA for long sitting in bed/rolling Postural control: Posterior lean     Standing balance comment: too lethargic today to attempt  Cognition Arousal/Alertness:  (initially alert but closing eyes more toward end of session) Behavior During Therapy: Flat affect,  Anxious Overall Cognitive Status: History of cognitive impairments - at baseline                                 General Comments: H/o alzheimer's dementia (per chart, A&Ox2 baseline).Pt confused, difficult to redirect for mobility participation, actively resisting most movements with RLE and not following simple commands. Mitts removed during functional mobility tasks but replaced at end of session as pt starting to fiddle with lines. Pt with slightly improved tolerance with rolling toward his L side today. Pt refusing to drink water or take bites of his lunch despite multiple prompts and bed up in chair posture/lights on to promote alertness        Exercises Other Exercises Other Exercises: supine BLE AAROM: ankle pumps, heel slides, hip abduction, LAQ (bed chair posture), hip flexion, SLR (AAROM to PROM on RLE) x10 reps ea    General Comments        Pertinent Vitals/Pain Pain Assessment Pain Assessment: PAINAD Breathing: occasional labored breathing, short period of hyperventilation Negative Vocalization: occasional moan/groan, low speech, negative/disapproving quality Facial Expression: facial grimacing Body Language: rigid, fists clenched, knees up, pushing/pulling away, strikes out Consolability: distracted or reassured by voice/touch PAINAD Score: 7 Pain Location: RLE with PROM/AAROM and bed mobility/rolling Pain Descriptors / Indicators: Grimacing, Guarding, Sore Pain Intervention(s): Limited activity within patient's tolerance, Premedicated before session, Monitored during session, Repositioned     PT Goals (current goals can now be found in the care plan section) Acute Rehab PT Goals Patient Stated Goal: pt unable to state PT Goal Formulation: Patient unable to participate in goal setting Time For Goal Achievement: 06/14/22 Progress towards PT goals: Not progressing toward goals - comment    Frequency    Min 3X/week      PT Plan Current plan  remains appropriate       AM-PAC PT "6 Clicks" Mobility   Outcome Measure  Help needed turning from your back to your side while in a flat bed without using bedrails?: Total (+2 assist) Help needed moving from lying on your back to sitting on the side of a flat bed without using bedrails?: Total Help needed moving to and from a bed to a chair (including a wheelchair)?: Total Help needed standing up from a chair using your arms (e.g., wheelchair or bedside chair)?: Total Help needed to walk in hospital room?: Total Help needed climbing 3-5 steps with a railing? : Total 6 Click Score: 6    End of Session   Activity Tolerance: Patient limited by pain;Other (comment);Patient limited by lethargy (limited by cognition) Patient left: in bed;with call bell/phone within reach;with bed alarm set;Other (comment) (bed in chair posture to promote alertness, lights on in his room, mitts donned for pt safety) Nurse Communication: Mobility status;Precautions;Need for lift equipment (will need mechanical lift for OOB; pt too drowsy to eat dinner when tray arrived during session) PT Visit Diagnosis: Other abnormalities of gait and mobility (R26.89);Muscle weakness (generalized) (M62.81);Pain Pain - Right/Left: Right Pain - part of body: Leg     Time: 1702-1720 PT Time Calculation (min) (ACUTE ONLY): 18 min  Charges:  $Therapeutic Exercise: 8-22 mins                     Kennia Vanvorst P., PTA Acute Rehabilitation Services Secure Chat Preferred 9a-5:30pm  Office: [place] 06/09/2022, 8:00 PM

## 2022-06-10 NOTE — Progress Notes (Signed)
PROGRESS NOTE    Edwin [name]  VPX:[id number] DOB: [birth year] DOA: 05/28/2022 PCP: [name], DO   Brief Narrative:  89 year old white male known history of dementia Prior mechanical hip replacement status post fall 07/2021 Relatively high functioning despite dementia soft, sundowning Developed a mechanical fall had difficulty bearing weight and was found to have a periprosthetic fracture Orthopedics Dr. Dion Saucier was consulted  Assessment & Plan:   Principal Problem:   Closed right hip fracture (HCC) Active Problems:   Dementia without behavioral disturbance (HCC)   Hypotension   Chronic anemia  Right periprosthetic hip fracture: This post right hip hemiarthroplasty revision on 05/30/2022 by Dr. Eulah Pont.  Patient doing well.  Management per orthopedics.  PT OT recommended SNF.  TOC consulted.  Waiting for placement.  Moderate dementia -Pleasantly confused and looks well.  Not on any medications at home.  Low-grade postop fever with hypotension: Had low-grade fever of 100.5 around 3:30 AM on 06/01/2022 and blood pressure was 74/46, lactic acid normal, he was given IV fluids, blood pressure much better today.  He has remained afebrile since then.  Remains on gentle IV hydration.  Acute blood loss on chronic anemia: Baseline globin appears to be between 9 and 11, dropped to 8.4 likely secondary to surgery but no indication of transfusion.  Monitor daily.  DVT prophylaxis:    Code Status: Prior  Family Communication:  None present at bedside.   Status is: Inpatient Remains inpatient appropriate because: Awaiting placement   Estimated body mass index is 20.42 kg/m as calculated from the following:   Height as of this encounter: 5\' 11"  (1.803 m).   Weight as of this encounter: 66.4 kg.    Nutritional Assessment: Body mass index is 20.42 kg/m. Seen by dietician.  I agree with the assessment and plan as outlined below: Nutrition Status:  Skin Assessment: I have  examined the patient's skin and I agree with the wound assessment as performed by the wound care RN as outlined below:    Consultants:  Orthopedics  Procedures:  As above  Antimicrobials:  Anti-infectives (From admission, onward)    Start     Dose/Rate Route Frequency Ordered Stop   05/30/22 1700  ceFAZolin (ANCEF) IVPB 2g/100 mL premix        2 g 200 mL/hr over 30 Minutes Intravenous Every 6 hours 05/30/22 1514 05/31/22 0242   05/30/22 0817  ceFAZolin (ANCEF) 2-4 GM/100ML-% IVPB       Note to Pharmacy: 06/01/22 D: cabinet override      05/30/22 0817 05/30/22 1145   05/30/22 0745  ceFAZolin (ANCEF) IVPB 2g/100 mL premix        2 g 200 mL/hr over 30 Minutes Intravenous On call to O.R. 05/30/22 0647 05/30/22 1135         Subjective:  Patient seen and examined.  Alert and oriented to self only.  No complaints.  Objective: Vitals:   06/08/22 2057 06/09/22 0353 06/09/22 0723 06/09/22 1401  BP: 112/81 127/70 105/71 117/75  Pulse: 78 65 73 88  Resp: 17 16    Temp: 98 F (36.7 C) 98 F (36.7 C) 98.6 F (37 C) 98.2 F (36.8 C)  TempSrc: Axillary Axillary Oral Oral  SpO2: 98% 99% 91% 100%  Weight:      Height:       No intake or output data in the 24 hours ending 06/10/22 1446  Filed Weights   05/29/22 0240 05/30/22 0822  Weight: 66.4 kg 66.4 kg  Examination:  General exam: Appears calm and comfortable  Respiratory system: Clear to auscultation. Respiratory effort normal. Cardiovascular system: S1 & S2 heard, RRR. No JVD, murmurs, rubs, gallops or clicks. No pedal edema. Gastrointestinal system: Abdomen is nondistended, soft and nontender. No organomegaly or masses felt. Normal bowel sounds heard. Central nervous system: Alert and oriented x 1. No focal neurological deficits. Extremities: Symmetric 5 x 5 power. Skin: No rashes, lesions or ulcers.   Data Reviewed: I have personally reviewed following labs and imaging studies  CBC: Recent Labs  Lab  06/05/22 0959  WBC 5.8  HGB 9.3*  HCT 28.6*  MCV 84.9  PLT 643    Basic Metabolic Panel: Recent Labs  Lab 06/05/22 0959 06/06/22 0556  NA 139  --   K 3.9  --   CL 105  --   CO2 28  --   GLUCOSE 104*  --   BUN 9  --   CREATININE 1.01 1.00  CALCIUM 8.4*  --     GFR: Estimated Creatinine Clearance: 49.8 mL/min (by C-G formula based on SCr of 1 mg/dL). Liver Function Tests: No results for input(s): "AST", "ALT", "ALKPHOS", "BILITOT", "PROT", "ALBUMIN" in the last 168 hours.  No results for input(s): "LIPASE", "AMYLASE" in the last 168 hours. No results for input(s): "AMMONIA" in the last 168 hours. Coagulation Profile: No results for input(s): "INR", "PROTIME" in the last 168 hours.  Cardiac Enzymes: No results for input(s): "CKTOTAL", "CKMB", "CKMBINDEX", "TROPONINI" in the last 168 hours.  BNP (last 3 results) No results for input(s): "PROBNP" in the last 8760 hours. HbA1C: No results for input(s): "HGBA1C" in the last 72 hours. CBG: No results for input(s): "GLUCAP" in the last 168 hours. Lipid Profile: No results for input(s): "CHOL", "HDL", "LDLCALC", "TRIG", "CHOLHDL", "LDLDIRECT" in the last 72 hours. Thyroid Function Tests: No results for input(s): "TSH", "T4TOTAL", "FREET4", "T3FREE", "THYROIDAB" in the last 72 hours. Anemia Panel: No results for input(s): "VITAMINB12", "FOLATE", "FERRITIN", "TIBC", "IRON", "RETICCTPCT" in the last 72 hours. Sepsis Labs: No results for input(s): "PROCALCITON", "LATICACIDVEN" in the last 168 hours.   Recent Results (from the past 240 hour(s))  Respiratory (~20 pathogens) panel by PCR     Status: None   Collection Time: 06/06/22  6:00 PM   Specimen: Nasopharyngeal Swab; Respiratory  Result Value Ref Range Status   Adenovirus NOT DETECTED NOT DETECTED Final   Coronavirus 229E NOT DETECTED NOT DETECTED Final    Comment: (NOTE) The Coronavirus on the Respiratory Panel, DOES NOT test for the novel  Coronavirus (2019  nCoV)    Coronavirus HKU1 NOT DETECTED NOT DETECTED Final   Coronavirus NL63 NOT DETECTED NOT DETECTED Final   Coronavirus OC43 NOT DETECTED NOT DETECTED Final   Metapneumovirus NOT DETECTED NOT DETECTED Final   Rhinovirus / Enterovirus NOT DETECTED NOT DETECTED Final   Influenza A NOT DETECTED NOT DETECTED Final   Influenza B NOT DETECTED NOT DETECTED Final   Parainfluenza Virus 1 NOT DETECTED NOT DETECTED Final   Parainfluenza Virus 2 NOT DETECTED NOT DETECTED Final   Parainfluenza Virus 3 NOT DETECTED NOT DETECTED Final   Parainfluenza Virus 4 NOT DETECTED NOT DETECTED Final   Respiratory Syncytial Virus NOT DETECTED NOT DETECTED Final   Bordetella pertussis NOT DETECTED NOT DETECTED Final   Bordetella Parapertussis NOT DETECTED NOT DETECTED Final   Chlamydophila pneumoniae NOT DETECTED NOT DETECTED Final   Mycoplasma pneumoniae NOT DETECTED NOT DETECTED Final    Comment: Performed at [facility]  Lab, [address], [place], [place] [address]     Radiology Studies: No results found.  Scheduled Meds:   Continuous Infusions:     LOS: 12 days   Darliss Cheney, MD [facility]  06/10/2022, 2:46 PM   *Please note that this is a verbal dictation therefore any spelling or grammatical errors are due to the "[place] [facility]" system interpretation.  Please page via [place] and do not message via secure chat for urgent patient care matters. Secure chat can be used for non urgent patient care matters.  How to contact the [facility] Attending or Consulting provider [place] or covering provider during after hours [place], for this patient?  Check the care team in [facility] and look for a) attending/consulting [facility] provider listed and b) the [facility] team listed. Page or secure chat [phone]. Log into [email or web address] and use [place]'s universal password to access. If you do not have the password, please contact the hospital operator. Locate the [facility] provider you are looking for under [facility] and page to a number that you can be directly reached. If you still have difficulty reaching the provider, please page the [facility] (Director on Call) for the Hospitalists listed on amion for assistance.

## 2022-11-29 ENCOUNTER — Other Ambulatory Visit: Payer: Self-pay

## 2022-11-29 ENCOUNTER — Emergency Department (HOSPITAL_BASED_OUTPATIENT_CLINIC_OR_DEPARTMENT_OTHER)
Admission: EM | Admit: 2022-11-29 | Discharge: 2022-11-29 | Disposition: A | Discharge status: Home / Self Care | Payer: Medicare HMO | Attending: Emergency Medicine | Admitting: Emergency Medicine

## 2022-11-29 ENCOUNTER — Emergency Department (HOSPITAL_BASED_OUTPATIENT_CLINIC_OR_DEPARTMENT_OTHER): Payer: Medicare HMO

## 2022-11-29 DIAGNOSIS — D696 Thrombocytopenia, unspecified: Secondary | ICD-10-CM | POA: Diagnosis not present

## 2022-11-29 DIAGNOSIS — X58XXXA Exposure to other specified factors, initial encounter: Secondary | ICD-10-CM | POA: Insufficient documentation

## 2022-11-29 DIAGNOSIS — R059 Cough, unspecified: Secondary | ICD-10-CM | POA: Diagnosis present

## 2022-11-29 DIAGNOSIS — F0390 Unspecified dementia without behavioral disturbance: Secondary | ICD-10-CM | POA: Diagnosis not present

## 2022-11-29 DIAGNOSIS — S81801A Unspecified open wound, right lower leg, initial encounter: Secondary | ICD-10-CM | POA: Insufficient documentation

## 2022-11-29 DIAGNOSIS — Z1152 Encounter for screening for COVID-19: Secondary | ICD-10-CM | POA: Insufficient documentation

## 2022-11-29 LAB — BASIC METABOLIC PANEL
Anion gap: 8 (ref 5–15)
BUN: 10 mg/dL (ref 8–23)
CO2: 27 mmol/L (ref 22–32)
Calcium: 9.8 mg/dL (ref 8.9–10.3)
Chloride: 102 mmol/L (ref 98–111)
Creatinine, Ser: 0.82 mg/dL (ref 0.61–1.24)
GFR, Estimated: >60 mL/min (ref >60)
Glucose, Bld: 134 mg/dL — ABNORMAL HIGH (ref 70–99)
Potassium: 3.7 mmol/L (ref 3.5–5.1)
Sodium: 137 mmol/L (ref 135–145)

## 2022-11-29 LAB — RESP PANEL BY RT-PCR (RSV, FLU A&B, COVID)  RVPGX2
Influenza A by PCR: NEGATIVE
Influenza B by PCR: NEGATIVE
Resp Syncytial Virus by PCR: NEGATIVE
SARS Coronavirus 2 by RT PCR: NEGATIVE

## 2022-11-29 LAB — CBC
HCT: 33.8 % — ABNORMAL LOW (ref 39.0–52.0)
Hemoglobin: 11 g/dL — ABNORMAL LOW (ref 13.0–17.0)
MCH: 24.8 pg — ABNORMAL LOW (ref 26.0–34.0)
MCHC: 32.5 g/dL (ref 30.0–36.0)
MCV: 76.1 fL — ABNORMAL LOW (ref 80.0–100.0)
Platelets: 80 10*3/uL — ABNORMAL LOW (ref 150–400)
RBC: 4.44 MIL/uL (ref 4.22–5.81)
RDW: 17.6 % — ABNORMAL HIGH (ref 11.5–15.5)
WBC: 8.7 10*3/uL (ref 4.0–10.5)
nRBC: 0 % (ref 0.0–0.2)

## 2022-11-29 NOTE — ED Notes (Signed)
-  Called [facility] at 833pm for transportation.

## 2022-11-29 NOTE — ED Provider Notes (Signed)
[name] Provider Note   CSN: [id number] Arrival date & time: 11/29/22  1851     History  Chief Complaint  Patient presents with   Cough    Edwin [name] is a 89 y.o. male.  Is an 89 year old male with a past medical history of dementia presenting to the emergency department with a cough.  Patient lives at home with his wife who is his POA and reports that last night he had a mild cough that increased this morning.  She states that he has had no fevers, no vomiting and has not complained of any pain.  She states that he has had a decreased appetite and has just been spitting up a lot of mucus when he tries to eat.  She states that she did try to give him oral honey and a natural cough supplement but did not have much improvement.  She states that he does have a known wound to his right leg but states that this is seen by wound care and has regular dressing changes and states that it has been improving.  She denies any increased drainage or redness around his wound.  The history is provided by the spouse. History limited by: Level 5 caveat for dementia.  Cough      Home Medications Prior to Admission medications   Medication Sig Start Date End Date Taking? Authorizing Provider  docusate (COLACE) 50 MG/5ML liquid Take 10 mLs (100 mg total) by mouth 2 (two) times daily. 06/08/22   Lanae Boast, MD  enoxaparin (LOVENOX) 40 MG/0.4ML injection Inject 0.4 mLs (40 mg total) into the skin daily for 18 days. 06/03/22 06/21/22  Hughie Closs, MD  HYDROcodone-acetaminophen (NORCO/VICODIN) 5-325 MG tablet Take 1 tablet by mouth every 6 (six) hours as needed for moderate pain (pain score 4-6). 06/02/22   Hughie Closs, MD  Multiple Vitamin (MULTIVITAMIN WITH MINERALS) TABS tablet Take 1 tablet by mouth daily. 06/09/22   Lanae Boast, MD  Nutritional Supplements (FEEDING SUPPLEMENT, KATE FARMS STANDARD 1.4,) LIQD liquid Take 325 mLs by mouth 2 (two) times  daily between meals. 06/08/22   Lanae Boast, MD      Allergies    Milk-related compounds, Beef-derived products, Egg-derived products, and Other    Review of Systems   Review of Systems  Respiratory:  Positive for cough.     Physical Exam Updated Vital Signs BP 125/86   Pulse 97   Temp (!) 97.1 F (36.2 C) (Temporal)   Resp 16   SpO2 96%  Physical Exam Vitals and nursing note reviewed.  Constitutional:      General: He is not in acute distress.    Appearance: Normal appearance.  HENT:     Head: Normocephalic and atraumatic.     Nose: Nose normal.     Mouth/Throat:     Mouth: Mucous membranes are moist.     Comments: Clear sputum in the posterior oropharynx patient is coughing up Eyes:     Extraocular Movements: Extraocular movements intact.     Conjunctiva/sclera: Conjunctivae normal.  Cardiovascular:     Rate and Rhythm: Normal rate and regular rhythm.     Heart sounds: Normal heart sounds.  Pulmonary:     Effort: Pulmonary effort is normal.     Breath sounds: Normal breath sounds.  Abdominal:     General: Abdomen is flat.     Palpations: Abdomen is soft.     Tenderness: There is no abdominal tenderness.  Musculoskeletal:  General: Normal range of motion.     Cervical back: Normal range of motion.  Skin:    General: Skin is warm and dry.     Comments: ~3x5 cm wound to R posterior calf with mild serous drainage and small area of necrotic skin overlying, no surrounding erythema or warmth  Neurological:     General: No focal deficit present.     Mental Status: He is alert. Mental status is at baseline.  Psychiatric:        Mood and Affect: Mood normal.        Behavior: Behavior normal.     ED Results / Procedures / Treatments   Labs (all labs ordered are listed, but only abnormal results are displayed) Labs Reviewed  BASIC METABOLIC PANEL - Abnormal; Notable for the following components:      Result Value   Glucose, Bld 134 (*)    All other components  within normal limits  CBC - Abnormal; Notable for the following components:   Hemoglobin 11.0 (*)    HCT 33.8 (*)    MCV 76.1 (*)    MCH 24.8 (*)    RDW 17.6 (*)    Platelets 80 (*)    All other components within normal limits  RESP PANEL BY RT-PCR (RSV, FLU A&B, COVID)  [id number]    EKG None  Radiology DG Chest Portable 1 View  Result Date: 11/29/2022 CLINICAL DATA:  Cough, shortness of breath. EXAM: PORTABLE CHEST 1 VIEW COMPARISON:  Chest radiograph dated June 05, 2022 FINDINGS: The heart size and mediastinal contours are within normal limits. No focal consolidation or large pleural effusion. Osteopenia and bilateral glenohumeral osteoarthritis. Thoracic spondylosis. IMPRESSION: No active disease. Electronically Signed   By: Larose Hires D.O.   On: 11/29/2022 19:37    Procedures Procedures    Medications Ordered in ED Medications - No data to display  ED Course/ Medical Decision Making/ A&P Clinical Course as of 11/29/22 2027  [name] Nov 29, 2022  2024 Labs with thrombocytopenia, mildly lower than baseline but does have a history of thrombocytopenia and his labs in the past.  Chest x-ray is without acute disease and viral swab is negative.  He was recommended continued symptomatic management.  The patient's wife requested natural treatments only and was recommended raw honey and using a humidifier.  They are recommended close primary care follow-up. [VK]    Clinical Course User Index [VK] [name], DO                             Medical Decision Making This patient presents to the ED with chief complaint(s) of cough with pertinent past medical history of dementia which further complicates the presenting complaint. The complaint involves an extensive differential diagnosis and also carries with it a high risk of complications and morbidity.    The differential diagnosis includes pneumonia, pneumothorax, pulmonary edema, pleural effusion, viral  syndrome  Additional history obtained: Additional history obtained from spouse Records reviewed N/A  ED Course and Reassessment: On patient's arrival to the emergency department he is hemodynamically stable in no acute distress.  He is following commands in all 4 extremities and does appear to be at his neurologic baseline.  He does have a lot of mucus that he is spitting up that appears to be clear.  Patient will follow-up labs, chest x-ray and viral swab to evaluate for cause of his symptoms and will be closely  reassessed.  Independent labs interpretation:  The following labs were independently interpreted: Thrombocytopenia otherwise within normal range  Independent visualization of imaging: - I independently visualized the following imaging with scope of interpretation limited to determining acute life threatening conditions related to emergency care: Chest x-ray, which revealed no acute disease  Consultation: - Consulted or discussed management/test interpretation w/ external professional: N/A  Consideration for admission or further workup: Patient has no emergent conditions requiring admission or further work-up at this time and is stable for discharge home with primary care follow-up  Social Determinants of health: N/A    Amount and/or Complexity of Data Reviewed Labs: ordered. Radiology: ordered.          Final Clinical Impression(s) / ED Diagnoses Final diagnoses:  Cough, unspecified type  Thrombocytopenia (HCC)  Wound of right lower extremity, initial encounter    Rx / DC Orders ED Discharge Orders     None         [name], DO 11/29/22 2027

## 2022-11-29 NOTE — Discharge Instructions (Signed)
You were seen in the emergency department for your cough.  Your workup showed no signs of pneumonia and you tested negative for COVID, flu and RSV.  You may have another type of viral infection and can continue symptomatic treatment with raw honey and using a humidifier or steam to help keep your mucus loose.  You can follow-up with your primary doctor to have your symptoms rechecked.  Your platelet count was incidentally found to be low as well which has been in the past and this can also be monitored by your primary doctor as well as have your wound monitored.  You should return to the emergency department if you are having fevers, difficulty breathing or any other new or concerning symptoms.

## 2022-11-29 NOTE — ED Notes (Signed)
Patients wife has been called to make her aware that [facility] is taking pt back home. Pt wife is verbalizes understanding and will be waiting for pt.

## 2022-11-29 NOTE — ED Triage Notes (Signed)
Patient BIB [facility] from yesterday PM.   Notes Cough since Last PM.   Oriented to baseline but more lethargic. No Known Fevers. VSS with EMS (Mildly Hypertensive with EMS 160 Systolic).  NAD Noted during Triage. BIB Stretcher.

## 2022-12-18 ENCOUNTER — Emergency Department (HOSPITAL_COMMUNITY): Payer: Medicare HMO

## 2022-12-18 ENCOUNTER — Encounter (HOSPITAL_COMMUNITY): Payer: Self-pay

## 2022-12-18 ENCOUNTER — Other Ambulatory Visit: Payer: Self-pay

## 2022-12-18 ENCOUNTER — Inpatient Hospital Stay (HOSPITAL_COMMUNITY)
Admission: EM | Admit: 2022-12-18 | Discharge: 2023-01-07 | DRG: 853 | Disposition: E | Payer: Medicare HMO | Attending: Internal Medicine | Admitting: Internal Medicine

## 2022-12-18 DIAGNOSIS — F0390 Unspecified dementia without behavioral disturbance: Secondary | ICD-10-CM | POA: Diagnosis present

## 2022-12-18 DIAGNOSIS — F419 Anxiety disorder, unspecified: Secondary | ICD-10-CM | POA: Diagnosis not present

## 2022-12-18 DIAGNOSIS — I358 Other nonrheumatic aortic valve disorders: Secondary | ICD-10-CM | POA: Diagnosis present

## 2022-12-18 DIAGNOSIS — M4628 Osteomyelitis of vertebra, sacral and sacrococcygeal region: Secondary | ICD-10-CM | POA: Insufficient documentation

## 2022-12-18 DIAGNOSIS — Z66 Do not resuscitate: Secondary | ICD-10-CM | POA: Diagnosis not present

## 2022-12-18 DIAGNOSIS — L89899 Pressure ulcer of other site, unspecified stage: Secondary | ICD-10-CM

## 2022-12-18 DIAGNOSIS — J189 Pneumonia, unspecified organism: Secondary | ICD-10-CM | POA: Diagnosis present

## 2022-12-18 DIAGNOSIS — L89154 Pressure ulcer of sacral region, stage 4: Secondary | ICD-10-CM | POA: Diagnosis present

## 2022-12-18 DIAGNOSIS — Z6820 Body mass index (BMI) 20.0-20.9, adult: Secondary | ICD-10-CM

## 2022-12-18 DIAGNOSIS — L89159 Pressure ulcer of sacral region, unspecified stage: Secondary | ICD-10-CM

## 2022-12-18 DIAGNOSIS — I2699 Other pulmonary embolism without acute cor pulmonale: Secondary | ICD-10-CM | POA: Diagnosis not present

## 2022-12-18 DIAGNOSIS — I2609 Other pulmonary embolism with acute cor pulmonale: Secondary | ICD-10-CM | POA: Diagnosis not present

## 2022-12-18 DIAGNOSIS — L8994 Pressure ulcer of unspecified site, stage 4: Secondary | ICD-10-CM | POA: Diagnosis not present

## 2022-12-18 DIAGNOSIS — R64 Cachexia: Secondary | ICD-10-CM | POA: Diagnosis present

## 2022-12-18 DIAGNOSIS — Z681 Body mass index (BMI) 19 or less, adult: Secondary | ICD-10-CM

## 2022-12-18 DIAGNOSIS — B952 Enterococcus as the cause of diseases classified elsewhere: Secondary | ICD-10-CM | POA: Diagnosis present

## 2022-12-18 DIAGNOSIS — R609 Edema, unspecified: Secondary | ICD-10-CM | POA: Diagnosis not present

## 2022-12-18 DIAGNOSIS — E440 Moderate protein-calorie malnutrition: Secondary | ICD-10-CM | POA: Diagnosis present

## 2022-12-18 DIAGNOSIS — G9341 Metabolic encephalopathy: Secondary | ICD-10-CM | POA: Diagnosis present

## 2022-12-18 DIAGNOSIS — I82403 Acute embolism and thrombosis of unspecified deep veins of lower extremity, bilateral: Secondary | ICD-10-CM | POA: Diagnosis present

## 2022-12-18 DIAGNOSIS — R52 Pain, unspecified: Secondary | ICD-10-CM

## 2022-12-18 DIAGNOSIS — F0394 Unspecified dementia, unspecified severity, with anxiety: Secondary | ICD-10-CM | POA: Diagnosis present

## 2022-12-18 DIAGNOSIS — Z7189 Other specified counseling: Secondary | ICD-10-CM

## 2022-12-18 DIAGNOSIS — A419 Sepsis, unspecified organism: Principal | ICD-10-CM | POA: Diagnosis present

## 2022-12-18 DIAGNOSIS — R54 Age-related physical debility: Secondary | ICD-10-CM | POA: Diagnosis present

## 2022-12-18 DIAGNOSIS — M9701XA Periprosthetic fracture around internal prosthetic right hip joint, initial encounter: Secondary | ICD-10-CM | POA: Diagnosis present

## 2022-12-18 DIAGNOSIS — Z515 Encounter for palliative care: Secondary | ICD-10-CM

## 2022-12-18 DIAGNOSIS — R4589 Other symptoms and signs involving emotional state: Secondary | ICD-10-CM

## 2022-12-18 DIAGNOSIS — I96 Gangrene, not elsewhere classified: Secondary | ICD-10-CM | POA: Diagnosis present

## 2022-12-18 DIAGNOSIS — I3139 Other pericardial effusion (noninflammatory): Secondary | ICD-10-CM | POA: Diagnosis present

## 2022-12-18 DIAGNOSIS — Z7401 Bed confinement status: Secondary | ICD-10-CM

## 2022-12-18 DIAGNOSIS — B9620 Unspecified Escherichia coli [E. coli] as the cause of diseases classified elsewhere: Secondary | ICD-10-CM | POA: Diagnosis present

## 2022-12-18 DIAGNOSIS — E162 Hypoglycemia, unspecified: Secondary | ICD-10-CM | POA: Diagnosis present

## 2022-12-18 DIAGNOSIS — D638 Anemia in other chronic diseases classified elsewhere: Secondary | ICD-10-CM | POA: Diagnosis present

## 2022-12-18 DIAGNOSIS — E876 Hypokalemia: Secondary | ICD-10-CM | POA: Diagnosis present

## 2022-12-18 DIAGNOSIS — Z91012 Allergy to eggs: Secondary | ICD-10-CM

## 2022-12-18 DIAGNOSIS — L97913 Non-pressure chronic ulcer of unspecified part of right lower leg with necrosis of muscle: Secondary | ICD-10-CM | POA: Insufficient documentation

## 2022-12-18 DIAGNOSIS — I2694 Multiple subsegmental pulmonary emboli without acute cor pulmonale: Secondary | ICD-10-CM | POA: Diagnosis present

## 2022-12-18 DIAGNOSIS — R5381 Other malaise: Secondary | ICD-10-CM | POA: Diagnosis present

## 2022-12-18 DIAGNOSIS — Z79899 Other long term (current) drug therapy: Secondary | ICD-10-CM

## 2022-12-18 DIAGNOSIS — Z7901 Long term (current) use of anticoagulants: Secondary | ICD-10-CM

## 2022-12-18 DIAGNOSIS — Z91014 Allergy to mammalian meats: Secondary | ICD-10-CM

## 2022-12-18 DIAGNOSIS — Z96641 Presence of right artificial hip joint: Secondary | ICD-10-CM | POA: Diagnosis present

## 2022-12-18 DIAGNOSIS — Z888 Allergy status to other drugs, medicaments and biological substances status: Secondary | ICD-10-CM

## 2022-12-18 DIAGNOSIS — Z91011 Allergy to milk products: Secondary | ICD-10-CM

## 2022-12-18 DIAGNOSIS — D649 Anemia, unspecified: Secondary | ICD-10-CM | POA: Diagnosis present

## 2022-12-18 DIAGNOSIS — R6521 Severe sepsis with septic shock: Secondary | ICD-10-CM | POA: Diagnosis present

## 2022-12-18 DIAGNOSIS — R7989 Other specified abnormal findings of blood chemistry: Secondary | ICD-10-CM | POA: Diagnosis present

## 2022-12-18 LAB — CBC WITH DIFFERENTIAL/PLATELET
Abs Immature Granulocytes: 0.06 10*3/uL (ref 0.00–0.07)
Basophils Absolute: 0 10*3/uL (ref 0.0–0.1)
Basophils Relative: 0 %
Eosinophils Absolute: 0 10*3/uL (ref 0.0–0.5)
Eosinophils Relative: 0 %
HCT: 29.2 % — ABNORMAL LOW (ref 39.0–52.0)
Hemoglobin: 9.4 g/dL — ABNORMAL LOW (ref 13.0–17.0)
Immature Granulocytes: 1 %
Lymphocytes Relative: 6 %
Lymphs Abs: 0.5 10*3/uL — ABNORMAL LOW (ref 0.7–4.0)
MCH: 24.1 pg — ABNORMAL LOW (ref 26.0–34.0)
MCHC: 32.2 g/dL (ref 30.0–36.0)
MCV: 74.9 fL — ABNORMAL LOW (ref 80.0–100.0)
Monocytes Absolute: 0.1 10*3/uL (ref 0.1–1.0)
Monocytes Relative: 1 %
Neutro Abs: 8.4 10*3/uL — ABNORMAL HIGH (ref 1.7–7.7)
Neutrophils Relative %: 92 %
Platelets: 153 10*3/uL (ref 150–400)
RBC: 3.9 MIL/uL — ABNORMAL LOW (ref 4.22–5.81)
RDW: 17.2 % — ABNORMAL HIGH (ref 11.5–15.5)
WBC: 9.1 10*3/uL (ref 4.0–10.5)
nRBC: 0.4 % — ABNORMAL HIGH (ref 0.0–0.2)

## 2022-12-18 LAB — PROTIME-INR
INR: 1.3 — ABNORMAL HIGH (ref 0.8–1.2)
Prothrombin Time: 16.7 seconds — ABNORMAL HIGH (ref 11.4–15.2)

## 2022-12-18 LAB — TROPONIN I (HIGH SENSITIVITY): Troponin I (High Sensitivity): 59 ng/L — ABNORMAL HIGH (ref <18)

## 2022-12-18 LAB — COMPREHENSIVE METABOLIC PANEL
ALT: 28 U/L (ref 0–44)
AST: 43 U/L — ABNORMAL HIGH (ref 15–41)
Albumin: 2.2 g/dL — ABNORMAL LOW (ref 3.5–5.0)
Alkaline Phosphatase: 73 U/L (ref 38–126)
Anion gap: 9 (ref 5–15)
BUN: 11 mg/dL (ref 8–23)
CO2: 24 mmol/L (ref 22–32)
Calcium: 9.5 mg/dL (ref 8.9–10.3)
Chloride: 104 mmol/L (ref 98–111)
Creatinine, Ser: 0.81 mg/dL (ref 0.61–1.24)
GFR, Estimated: >60 mL/min (ref >60)
Glucose, Bld: 111 mg/dL — ABNORMAL HIGH (ref 70–99)
Potassium: 3.1 mmol/L — ABNORMAL LOW (ref 3.5–5.1)
Sodium: 137 mmol/L (ref 135–145)
Total Bilirubin: 1 mg/dL (ref 0.3–1.2)
Total Protein: 7.2 g/dL (ref 6.5–8.1)

## 2022-12-18 LAB — LACTIC ACID, PLASMA: Lactic Acid, Venous: 3.5 mmol/L (ref 0.5–1.9)

## 2022-12-18 LAB — APTT: aPTT: 28 seconds (ref 24–36)

## 2022-12-18 IMAGING — CR DG HIP (WITH OR WITHOUT PELVIS) 2-3V*R*
3 series · 3 of 3 positions shown · non-contrast
Comparison: None.

CLINICAL DATA: Trauma, fall

EXAM:
DG HIP (WITH OR WITHOUT PELVIS) 2-3V RIGHT

[t pelvis ap]
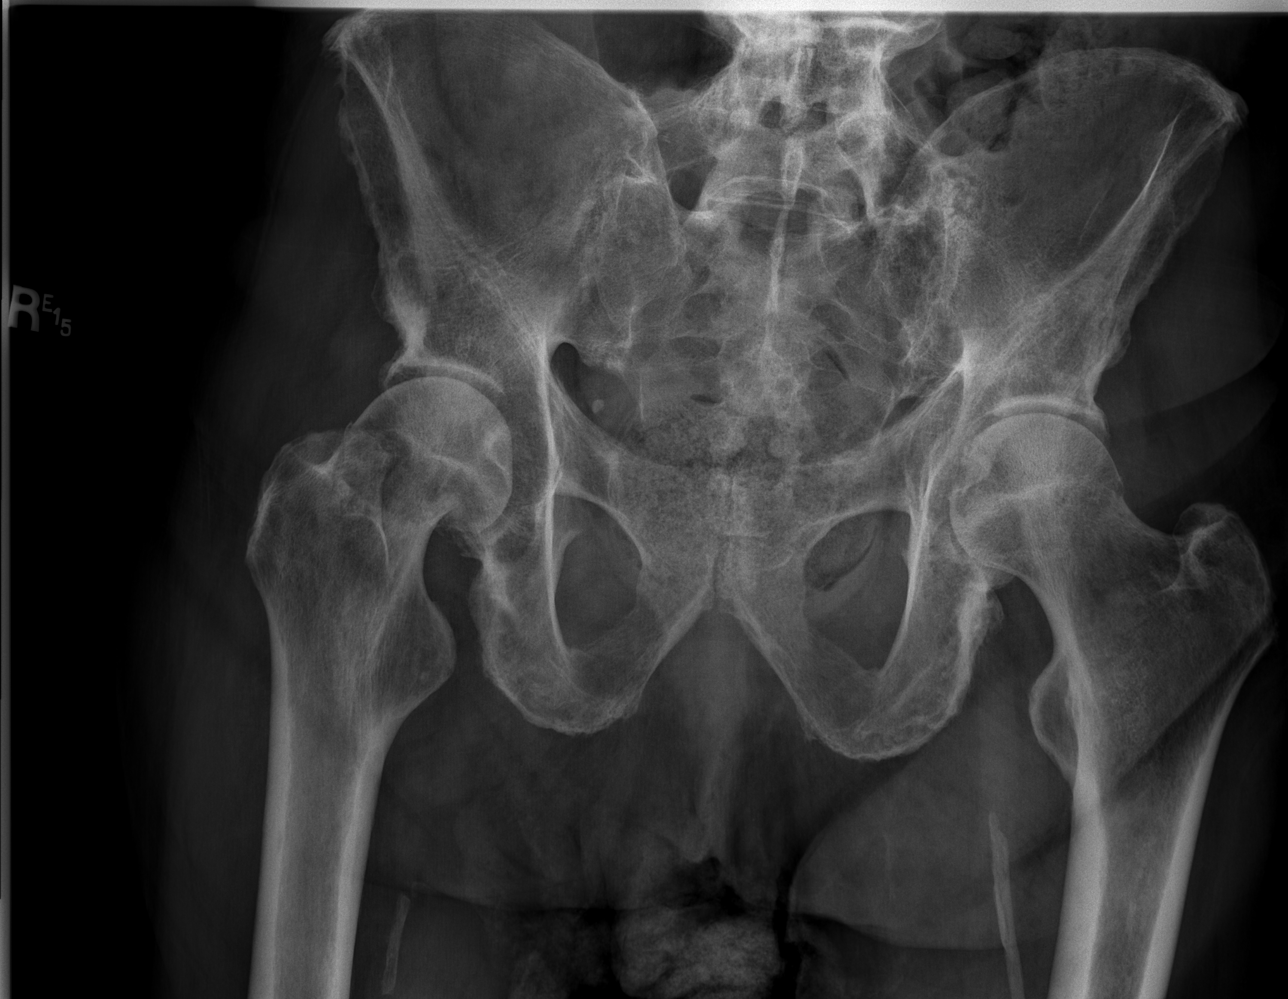

[t hip ap right]
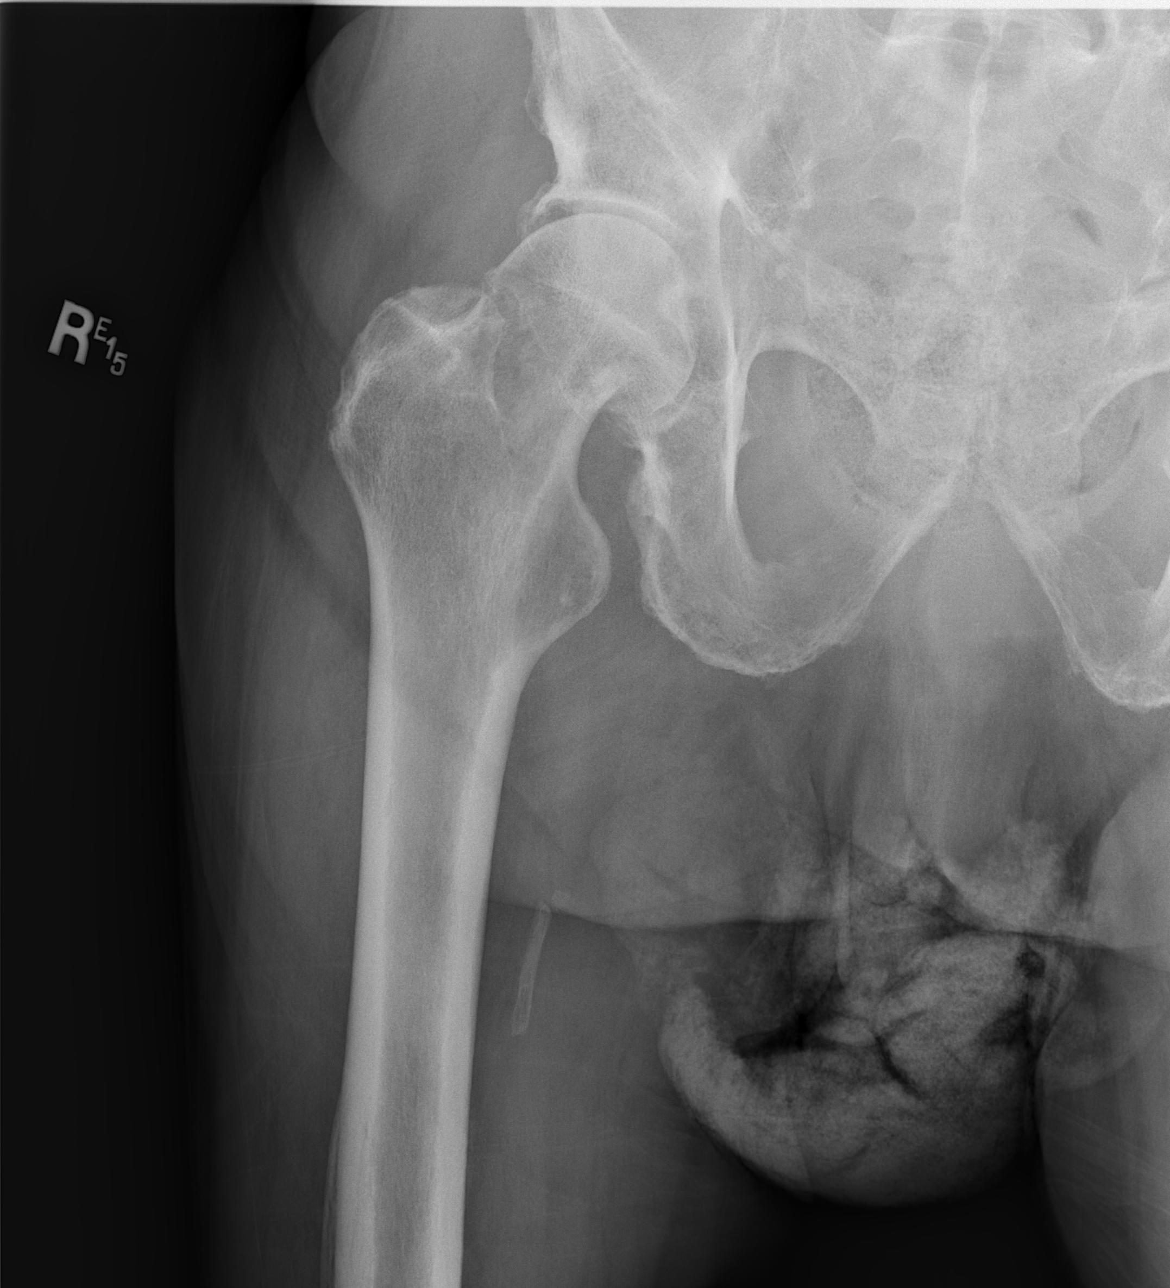

[t hip frog leg right]
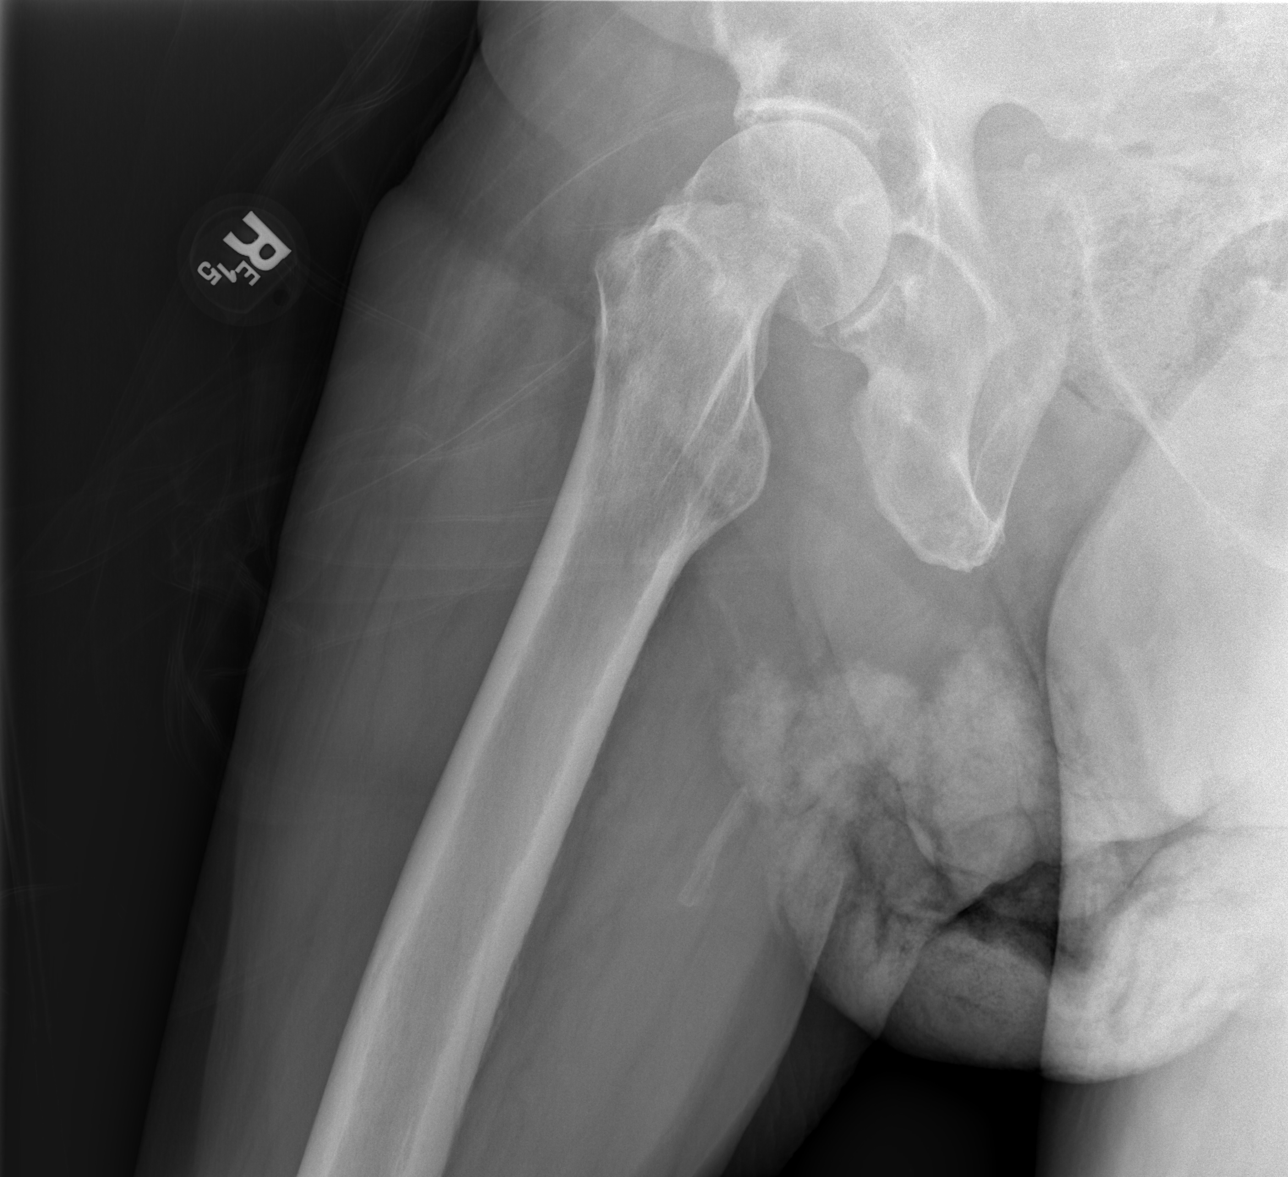

[3 of 3 positions shown; findings below may reference images not displayed]

FINDINGS: There is impacted fracture in the subcapital portion of neck of
right femur. There is no dislocation. Degenerative changes with
small bony spurs are noted in right hip.
IMPRESSION: Recent impacted fracture is seen in the subcapital portion of neck
of right femur. There is no dislocation. Degenerative changes with
small bony spurs seen in the right hip.

## 2022-12-18 MED ORDER — METRONIDAZOLE 500 MG/100ML IV SOLN
500.0000 mg | Freq: Once | INTRAVENOUS | Status: AC
  Administered 2022-12-18: 500 mg via INTRAVENOUS
  Filled 2022-12-18: qty 100

## 2022-12-18 MED ORDER — LACTATED RINGERS IV BOLUS (SEPSIS): 1000.0000 mL | Freq: Once | INTRAVENOUS | Status: DC

## 2022-12-18 MED ORDER — SODIUM CHLORIDE 0.9 % IV SOLN
2.0000 g | Freq: Once | INTRAVENOUS | Status: AC
  Administered 2022-12-18: 2 g via INTRAVENOUS
  Filled 2022-12-18: qty 12.5

## 2022-12-18 MED ORDER — LACTATED RINGERS IV BOLUS (SEPSIS)
1000.0000 mL | Freq: Once | INTRAVENOUS | Status: AC
  Administered 2022-12-18: 1000 mL via INTRAVENOUS

## 2022-12-18 MED ORDER — POTASSIUM CITRATE-CITRIC ACID 1100-334 MG/5ML PO SOLN: 20.0000 meq | Freq: Once | ORAL | Status: DC

## 2022-12-18 MED ORDER — LACTATED RINGERS IV SOLN: INTRAVENOUS | Status: DC

## 2022-12-18 MED ORDER — HEPARIN (PORCINE) 25000 UT/250ML-% IV SOLN
1400.0000 [IU]/h | INTRAVENOUS | Status: DC
  Administered 2022-12-19: 1250 [IU]/h via INTRAVENOUS
  Administered 2022-12-19: 1100 [IU]/h via INTRAVENOUS
  Administered 2022-12-20 – 2022-12-21 (×2): 1400 [IU]/h via INTRAVENOUS
  Filled 2022-12-18 (×4): qty 250

## 2022-12-18 MED ORDER — HEPARIN BOLUS VIA INFUSION
3000.0000 [IU] | Freq: Once | INTRAVENOUS | Status: AC
  Administered 2022-12-19: 3000 [IU] via INTRAVENOUS
  Filled 2022-12-18: qty 3000

## 2022-12-18 MED ORDER — SODIUM CHLORIDE (PF) 0.9 % IJ SOLN
INTRAMUSCULAR | Status: AC
  Filled 2022-12-18: qty 50

## 2022-12-18 MED ORDER — POTASSIUM CHLORIDE CRYS ER 20 MEQ PO TBCR: 20.0000 meq | EXTENDED_RELEASE_TABLET | Freq: Once | ORAL | Status: DC

## 2022-12-18 MED ORDER — LACTATED RINGERS IV SOLN: INTRAVENOUS | Status: AC

## 2022-12-18 MED ORDER — IOHEXOL 350 MG/ML SOLN
75.0000 mL | Freq: Once | INTRAVENOUS | Status: AC | PRN
  Administered 2022-12-18: 75 mL via INTRAVENOUS

## 2022-12-18 MED ORDER — VANCOMYCIN HCL IN DEXTROSE 1-5 GM/200ML-% IV SOLN
1000.0000 mg | Freq: Once | INTRAVENOUS | Status: AC
  Administered 2022-12-19: 1000 mg via INTRAVENOUS
  Filled 2022-12-18: qty 200

## 2022-12-18 NOTE — Progress Notes (Signed)
A consult was received from an ED physician for vanc and cefepime per pharmacy dosing.  The patient's profile has been reviewed for ht/wt/allergies/indication/available labs.   A one time order has been placed for vancomycin 1gm and cefepime 2gm.    Further antibiotics/pharmacy consults should be ordered by admitting physician if indicated.                       Thank you, Arley Phenix RPh 12/18/2022, 10:25 PM

## 2022-12-18 NOTE — H&P (Incomplete)
NAME:  Edwin [name], MRN:  [id number], DOB:  [birth year], LOS: 0 ADMISSION DATE:  12/18/2022, CONSULTATION DATE: 12/18/2022 REFERRING MD: Dr. Lynelle Doctor, CHIEF COMPLAINT: Pneumonia, pulmonary embolism  History of Present Illness:  89 year old man with history of dementia, right hip fracture/hemiarthroplasty 05/2022, bedridden and nonverbal at baseline.  Family is able to assist him into a wheelchair intermittently and he has home health assistance.  He has reportedly had ongoing cough for a few weeks.  There was concern that he was worsening and EMS was called.  In the emergency department he has been relatively hypotensive and tachycardic.  Noted to have a large sacral decubitus ulcer, right lower extremity decubitus ulcer with significant breakdown and malodorous drainage.  CT-PA chest identified scattered subsegmental bronchial impaction posterior left lower lobe, to a lesser degree posterior right lower lobe as well as small bilateral segmental and subsegmental pulmonary embolism.  RV/LV ratio 1.2 suggesting right heart strain.  He has received vancomycin and cefepime, started on heparin infusion, volume resuscitation.  Lactic acid slightly elevated 3.5, troponin 59, WBC 3.9 with a left shift.   Pertinent  Medical History   Past Medical History:  Diagnosis Date   Dementia (HCC)     Significant Hospital Events: Including procedures, antibiotic start and stop dates in addition to other pertinent events   CT-PA 7/12 >>   Interim History / Subjective:    Objective   Blood pressure 99/78, pulse (!) 102, temperature 100 F (37.8 C), temperature source Rectal, resp. rate (!) 25, height 5\' 11"  (1.803 m), weight 65.8 kg, SpO2 98%.       No intake or output data in the 24 hours ending 12/18/22 2325 Filed Weights   12/18/22 1840  Weight: 65.8 kg    Examination: General: Cachectic ill-appearing man laying in bed, somewhat uncomfortable HENT: Oropharynx dry, no stridor.  Pupils equal Lungs:  Decreased to both bases, no crackles or wheezes Cardiovascular: Tachycardic, regular, distant, no murmur Abdomen: Nondistended, hypoactive bowel sounds heard Extremities: Decubitus ulcer posterior right lower extremity superior to Achilles with some malodorous discharge Skin: Right lower extremity ulcer as above, large stage IV sacral decubitus ulcer with some malodorous discharge Neuro: Awake, eyes open, pupils equal.  He does not speak or follow commands.  Able to spontaneously move bilateral upper extremities, appears purposeful  Resolved Hospital Problem list     Assessment & Plan:   Sepsis, likely source sacral and right lower extremity decubitus ulcers, also left lower lobe pneumonia -Continue volume resuscitation -Follow lactic acid trend -Agree with broad-spectrum antibiotics cefepime, vancomycin -Blood cultures, urine culture.  Tailor antibiotics based on results -Procalcitonin -Wound care consultation >> may require general surgery eval -Will obtain CT pelvis and right lower extremity to ensure no evidence of gas formation, necrotizing fasciitis.   Pulmonary emboli with evidence for RV strain -Agree with heparin infusion -Follow troponin trend -Echocardiogram.  Based on RV function, oxygen needs, hemodynamics could consider consultation with IR for targeted lytics.  Would defer for now as, based on clot burden, I believe infection is driving his illness and presentation  Mild leukopenia, likely due to sepsis -Supportive care, treat underlying infection -Follow CBC  Hypokalemia -Replete potassium carefully -Follow BMP and urine output, at risk renal failure  Dementia -Supportive care -Avoid sedating medications  At risk malnutrition -N.p.o. for now until we can perform adequate swallowing evaluation    Best Practice (right click and "Reselect all SmartList Selections" daily)   Diet/type: NPO DVT prophylaxis: systemic heparin GI prophylaxis: PPI  Lines:  N/A Foley:  N/A Code Status:  full code Last date of multidisciplinary goals of care discussion [discussed goals for care with the patient's wife by phone 7/13.  Explained that in his debilitated condition that he likely would not survive mechanical ventilation or CPR.  At this time he is wife wants him to receive all aggressive care including ACLS, MV.  Will need to discuss further.  Full code at this time.]  Labs   CBC: Recent Labs  Lab 12/18/22 1807  WBC 9.1  NEUTROABS 8.4*  HGB 9.4*  HCT 29.2*  MCV 74.9*  PLT 153    Basic Metabolic Panel: Recent Labs  Lab 12/18/22 1807  NA 137  K 3.1*  CL 104  CO2 24  GLUCOSE 111*  BUN 11  CREATININE 0.81  CALCIUM 9.5   GFR: Estimated Creatinine Clearance: 60.9 mL/min (by C-G formula based on SCr of 0.81 mg/dL). Recent Labs  Lab 12/18/22 1807 12/18/22 2125  WBC 9.1  --   LATICACIDVEN  --  3.5*    Liver Function Tests: Recent Labs  Lab 12/18/22 1807  AST 43*  ALT 28  ALKPHOS 73  BILITOT 1.0  PROT 7.2  ALBUMIN 2.2*   No results for input(s): "LIPASE", "AMYLASE" in the last 168 hours. No results for input(s): "AMMONIA" in the last 168 hours.  ABG No results found for: "PHART", "PCO2ART", "PO2ART", "HCO3", "TCO2", "ACIDBASEDEF", "O2SAT"   Coagulation Profile: Recent Labs  Lab 12/18/22 1807  INR 1.3*    Cardiac Enzymes: No results for input(s): "CKTOTAL", "CKMB", "CKMBINDEX", "TROPONINI" in the last 168 hours.  HbA1C: No results found for: "HGBA1C"  CBG: No results for input(s): "GLUCAP" in the last 168 hours.  Review of Systems:   Unable to obtain  Past Medical History:  He,  has a past medical history of Dementia (HCC).   Surgical History:   Past Surgical History:  Procedure Laterality Date   HIP ARTHROPLASTY Right 07/27/2021   Procedure: ARTHROPLASTY BIPOLAR HIP (HEMIARTHROPLASTY);  Surgeon: Sheral Apley, MD;  Location: [facility];  Service: Orthopedics;  Laterality: Right;   HIP ARTHROPLASTY  Right 05/30/2022   Procedure: RIGHT HIP HEMIARTHROPLASTY REVISION;  Surgeon: Sheral Apley, MD;  Location: [facility] OR;  Service: Orthopedics;  Laterality: Right;     Social History:   reports that he has never smoked. He has never used smokeless tobacco. He reports that he does not currently use alcohol. He reports that he does not currently use drugs.   Family History:  His family history includes Dementia in his brother and sister.   Allergies Allergies  Allergen Reactions   Milk-Related Compounds Other (See Comments)    NO DAIRY, NO YOGURT, NO ICE CREAM, NO CHEESE, NO MILK (PREFERENCE)   Beef-Derived Products Other (See Comments)    Requested by wife.   Egg-Derived Products Other (See Comments)    Patient wife states they are vegetarian, eggs are not apart of their diet.    Other Other (See Comments)    Unnamed antibiotic- "Made him sick"- no further elaboration  Patient prefers to not take medication, in general     Home Medications  Prior to Admission medications   Medication Sig Start Date End Date Taking? Authorizing Provider  docusate (COLACE) 50 MG/5ML liquid Take 10 mLs (100 mg total) by mouth 2 (two) times daily. 06/08/22   Lanae Boast, MD  enoxaparin (LOVENOX) 40 MG/0.4ML injection Inject 0.4 mLs (40 mg total) into the skin daily for 18 days.  06/03/22 06/21/22  Hughie Closs, MD  HYDROcodone-acetaminophen (NORCO/VICODIN) 5-325 MG tablet Take 1 tablet by mouth every 6 (six) hours as needed for moderate pain (pain score 4-6). 06/02/22   Hughie Closs, MD  Multiple Vitamin (MULTIVITAMIN WITH MINERALS) TABS tablet Take 1 tablet by mouth daily. 06/09/22   Lanae Boast, MD  Nutritional Supplements (FEEDING SUPPLEMENT, KATE FARMS STANDARD 1.4,) LIQD liquid Take 325 mLs by mouth 2 (two) times daily between meals. 06/08/22   Lanae Boast, MD     Critical care time: 70 minutes     Levy Pupa, MD, PhD 12/18/2022, 11:25 PM [facility] [phone] or if no answer  before 7:00PM call [phone] For any issues after 7:00PM please call eLink [phone]

## 2022-12-18 NOTE — ED Notes (Signed)
Pt cleaned up with new draw sheet and new chuck and brief placed. Pt had barrier cream and sacral wound dressing applied. MD Lynelle Doctor made aware of wounds on sacrum and right lower leg near the ankle.

## 2022-12-18 NOTE — ED Notes (Signed)
Crit LA 3.5 Relayed to MD Lynelle Doctor

## 2022-12-18 NOTE — Progress Notes (Signed)
ANTICOAGULATION CONSULT NOTE - Initial Consult  Pharmacy Consult for heparin Indication: pulmonary embolus  Allergies  Allergen Reactions   Milk-Related Compounds Other (See Comments)    NO DAIRY, NO YOGURT, NO ICE CREAM, NO CHEESE, NO MILK (PREFERENCE)   Beef-Derived Products Other (See Comments)    Requested by wife.   Egg-Derived Products Other (See Comments)    Patient wife states they are vegetarian, eggs are not apart of their diet.    Other Other (See Comments)    Unnamed antibiotic- "Made him sick"- no further elaboration  Patient prefers to not take medication, in general    Patient Measurements: Height: 5\' 11"  (180.3 cm) Weight: 65.8 kg (145 lb) IBW/kg (Calculated) : 75.3 Heparin Dosing Weight: 65.8kg  Vital Signs: Temp: 98.3 F (36.8 C) (07/12 1734) Temp Source: Oral (07/12 1734) BP: 99/78 (07/12 2240) Pulse Rate: 102 (07/12 2240)  Labs: Recent Labs    12/18/22 1807 12/18/22 2125  HGB 9.4*  --   HCT 29.2*  --   PLT 153  --   APTT 28  --   LABPROT 16.7*  --   INR 1.3*  --   CREATININE 0.81  --   TROPONINIHS  --  59*    Estimated Creatinine Clearance: 60.9 mL/min (by C-G formula based on SCr of 0.81 mg/dL).   Medical History: Past Medical History:  Diagnosis Date   Dementia [facility])       Assessment: 4 male with advanced dementia presents for evaluation of abnormal vital signs.  CT shows PE, pharmacy to dose heparin drip.  No prior AC noted  aPTT 28, PT 16.7, INR 1.3, Hgb 9.4, plts 153, Scr 0.81  Goal of Therapy:  Heparin level 0.3-0.7 units/ml Monitor platelets by anticoagulation protocol: Yes   Plan:  Heparin bolus 3000 units x 1 Start heparin drip at 1100 units/hr Heparin level in 8 hours Daily CBC  Arley Phenix RPh 12/18/2022, 10:52 PM

## 2022-12-18 NOTE — ED Provider Notes (Addendum)
[place] EMERGENCY DEPARTMENT AT [facility] Provider Note   CSN: [id number] Arrival date & time: 12/18/22  1658     History  Chief Complaint  Patient presents with   Shortness of Breath   Cough    Edwin [name] is a 89 y.o. male.   Shortness of Breath Associated symptoms: cough   Cough Associated symptoms: shortness of breath      Patient has a history of dementia.  At baseline he is nonverbal.  He is bedridden.  Family is able to assist him into a wheelchair intermittently throughout the day.  Patient has home health assisting him.  Wife states he has had a cough ongoing for few weeks.  He was seen at another emergency room and had a reassuring workup reportedly and was sent home.  Family states that home health evaluate him today and one of the numbers was low.  Family is not sure if it was the pulse with a heart rate or the oxygen level.  EMS was called.  Home Medications Prior to Admission medications   Medication Sig Start Date End Date Taking? Authorizing Provider  docusate (COLACE) 50 MG/5ML liquid Take 10 mLs (100 mg total) by mouth 2 (two) times daily. 06/08/22   Lanae Boast, MD  enoxaparin (LOVENOX) 40 MG/0.4ML injection Inject 0.4 mLs (40 mg total) into the skin daily for 18 days. 06/03/22 06/21/22  Hughie Closs, MD  HYDROcodone-acetaminophen (NORCO/VICODIN) 5-325 MG tablet Take 1 tablet by mouth every 6 (six) hours as needed for moderate pain (pain score 4-6). 06/02/22   Hughie Closs, MD  Multiple Vitamin (MULTIVITAMIN WITH MINERALS) TABS tablet Take 1 tablet by mouth daily. 06/09/22   Lanae Boast, MD  Nutritional Supplements (FEEDING SUPPLEMENT, KATE FARMS STANDARD 1.4,) LIQD liquid Take 325 mLs by mouth 2 (two) times daily between meals. 06/08/22   Lanae Boast, MD      Allergies    Milk-related compounds, Beef-derived products, Egg-derived products, and Other    Review of Systems   Review of Systems  Respiratory:  Positive for cough and shortness of  breath.     Physical Exam Updated Vital Signs BP 99/78   Pulse (!) 102   Temp 100 F (37.8 C) (Rectal)   Resp (!) 25   Ht 1.803 m (5\' 11" )   Wt 65.8 kg   SpO2 98%   BMI 20.22 kg/m  Physical Exam Vitals and nursing note reviewed.  Constitutional:      Appearance: He is not diaphoretic.     Comments: Elderly, frail  HENT:     Head: Normocephalic and atraumatic.     Right Ear: External ear normal.     Left Ear: External ear normal.  Eyes:     General: No scleral icterus.       Right eye: No discharge.        Left eye: No discharge.     Conjunctiva/sclera: Conjunctivae normal.  Neck:     Trachea: No tracheal deviation.  Cardiovascular:     Rate and Rhythm: Normal rate and regular rhythm.  Pulmonary:     Effort: Pulmonary effort is normal. No respiratory distress.     Breath sounds: Normal breath sounds. No stridor. No decreased breath sounds, wheezing or rales.     Comments: Occasional cough Abdominal:     General: Bowel sounds are normal. There is no distension.     Palpations: Abdomen is soft.     Tenderness: There is no abdominal tenderness. There is  no guarding or rebound.  Musculoskeletal:        General: No tenderness or deformity.     Cervical back: Neck supple.  Skin:    General: Skin is warm and dry.     Findings: No rash.  Neurological:     General: No focal deficit present.     Cranial Nerves: No cranial nerve deficit or facial asymmetry.     Motor: No seizure activity.     Comments: Patient is nonverbal, does move all extremities, withdraws during exam, does not follow commands  Psychiatric:        Mood and Affect: Mood normal.        ED Results / Procedures / Treatments   Labs (all labs ordered are listed, but only abnormal results are displayed) Labs Reviewed  COMPREHENSIVE METABOLIC PANEL - Abnormal; Notable for the following components:      Result Value   Potassium 3.1 (*)    Glucose, Bld 111 (*)    Albumin 2.2 (*)    AST 43 (*)     All other components within normal limits  CBC WITH DIFFERENTIAL/PLATELET - Abnormal; Notable for the following components:   RBC 3.90 (*)    Hemoglobin 9.4 (*)    HCT 29.2 (*)    MCV 74.9 (*)    MCH 24.1 (*)    RDW 17.2 (*)    nRBC 0.4 (*)    Neutro Abs 8.4 (*)    Lymphs Abs 0.5 (*)    All other components within normal limits  PROTIME-INR - Abnormal; Notable for the following components:   Prothrombin Time 16.7 (*)    INR 1.3 (*)    All other components within normal limits  LACTIC ACID, PLASMA - Abnormal; Notable for the following components:   Lactic Acid, Venous 3.5 (*)    All other components within normal limits  TROPONIN I (HIGH SENSITIVITY) - Abnormal; Notable for the following components:   Troponin I (High Sensitivity) 59 (*)    All other components within normal limits  CULTURE, BLOOD (ROUTINE X 2)  CULTURE, BLOOD (ROUTINE X 2)  APTT  LACTIC ACID, PLASMA  CBC  I-STAT CG4 LACTIC ACID, ED  TROPONIN I (HIGH SENSITIVITY)    EKG EKG Interpretation Date/Time:  Friday December 18 2022 18:05:44 EDT Ventricular Rate:  139 PR Interval:  90 QRS Duration:  91 QT Interval:  287 QTC Calculation: 432 R Axis:   177  Text Interpretation: Sinus tachycardia Multiform ventricular premature complexes Right axis deviation Abnormal R-wave progression, late transition Artifact in lead(s) I II III aVR aVL aVF V1 V2 V3 V4 V5 V6 Since last tracing rate faster Confirmed by Linwood Dibbles [phone]) on 12/18/2022 6:26:11 PM  Radiology CT Angio Chest PE W and/or Wo Contrast  Result Date: 12/18/2022 CLINICAL DATA:  Pulmonary embolism suspected. High probability. Shortness of breath and cough. EXAM: CT ANGIOGRAPHY CHEST WITH CONTRAST TECHNIQUE: Multidetector CT imaging of the chest was performed using the standard protocol during bolus administration of intravenous contrast. Multiplanar CT image reconstructions and MIPs were obtained to evaluate the vascular anatomy. RADIATION DOSE REDUCTION: This exam  was performed according to the departmental dose-optimization program which includes automated exposure control, adjustment of the mA and/or kV according to patient size and/or use of iterative reconstruction technique. CONTRAST:  75mL OMNIPAQUE IOHEXOL 350 MG/ML SOLN COMPARISON:  Portable chest today portable chest 11/29/2022. No prior cross-sectional imaging for comparison. FINDINGS: Cardiovascular: The pulmonary trunk is prominent measuring 3.3 cm, and there  is also elevated RV/LV ratio of 1.2 consistent with right heart dysfunction/strain. There are no central arterial clots, no large emboli. There is a small thrombus in a posterior basal right lower lobe segmental artery on 4: 97-100. On the left there are subsegmental lingular emboli on 4: 83-84 and 4:94-100. In the left lower lobe there is a lateral basal subsegmental embolus on 4: 100-104 with extension into the downstream small arteries in this distribution. No further emboli are seen. The heart is mildly enlarged. Pericardial effusion noted circumferentially up to maximum 2 cm in thickness to the right. The pulmonary veins are normal caliber. The great vessels are clear and branch normally. There is mild aortic atherosclerosis and tortuosity, borderline dilatation of the aortic root at the sinuses of Valsalva measuring 3.9 cm, and a slightly dilated ascending aorta measuring 4.2 cm. There is no aortic or great vessel dissection or stenosis. There are no visible coronary calcific plaques. There are small air pockets in both internal jugular veins, most likely either injected at the time of contrast injection or with placement of the patient's IV. There is no central air embolus. Mediastinum/Nodes: There is a heterogeneous 1 cm nodule in the left lobe of the thyroid gland.No follow-up imaging is recommended. Reference: J Am Coll Radiol. 2015 Feb;12(2): 143-50. There is no supraclavicular, axillary or intrathoracic adenopathy. Thoracic esophagus, both main  bronchi and thoracic trachea are unremarkable. Lungs/Pleura: Minimal centrilobular and paraseptal emphysematous change both lung apices. Diffuse bronchial thickening. Scattered subsegmental bronchial impactions are noted in the posterior basal left lower lobe with patchy consolidation which is most likely due to bronchopneumonia although component of infarct is possible. There is mild posterior atelectasis in the right lower lobe. No bronchial impactions are seen on the right. The lungs are otherwise clear. There is a small posteromedial right diaphragmatic fat herniation into the lower chest. There are trace pleural effusions. No pneumothorax or pleural thickening. Upper Abdomen: No acute radiographic findings. Streak artifact limits fine detail due to the patient's arms in the field. There is a 3.2 cm simple appearing cyst in the superior pole of the right kidney with a Hounsfield density of 11.4. No follow-up imaging is recommended. In hepatic segment 5 there is an indeterminate hypodense lesion measuring 3.2 cm with appearance of vessels at its periphery, possibly a hemangioma but further evaluation is recommended. Musculoskeletal: There is multilevel bridging enthesopathy of the thoracic spine. No spinal compression fracture or other acute or significant osseous findings. The ribcage is intact. No chest wall mass. Review of the MIP images confirms the above findings. IMPRESSION: 1. Positive for acute PE with CT findings of right heart strain (RV/LV Ratio = 1.2 ) consistent with at least submassive (intermediate risk) PE, although the visible clot burden is small. The presence of right heart strain has been associated with an increased risk of morbidity and mortality. Please refer to the "Code PE Focused" order set in EPIC. 2. Cardiomegaly with pericardial effusion. 3. Aortic atherosclerosis with borderline dilatation of the aortic root at the sinuses of Valsalva, and slightly dilated ascending aorta at 4.2 cm.  No dissection or stenosis. Recommend annual imaging followup by CTA or MRA. This recommendation follows 2010 ACCF/AHA/AATS/ACR/ASA/SCA/SCAI/SIR/STS/SVM Guidelines for the Diagnosis and Management of Patients with Thoracic Aortic Disease. Circulation. 2010; 121: Z610-R604. Aortic aneurysm NOS (ICD10-I71.9) 4. Bronchitis with scattered subsegmental bronchial impactions or aspiration in the posterior basal left lower lobe, with patchy consolidation which is most likely due to bronchopneumonia although component of infarct is possible.  Follow-up study recommended after treatment to ensure clearing. 5. Trace pleural effusions. 6. Indeterminate 3.2 cm hypodense lesion in hepatic segment 5. Follow-up nonemergent MRI or CT without and with contrast recommended. 7. Small air pockets in both internal jugular veins, most likely either injected at the time of contrast injection or with placement of the patient's IV. 8. Emphysema. 9. Critical Value/emergent results were called by telephone at the time of interpretation on 12/18/2022 at 10:23 pm to provider [facility] , who verbally acknowledged these results. Aortic Atherosclerosis (ICD10-I70.0) and Emphysema (ICD10-J43.9). Electronically Signed   By: Almira Bar M.D.   On: 12/18/2022 22:32   DG Chest Port 1 View  Result Date: 12/18/2022 CLINICAL DATA:  Shortness of breath and cough EXAM: PORTABLE CHEST 1 VIEW COMPARISON:  Radiograph 11/29/2022 FINDINGS: Stable cardiomediastinal silhouette. No focal consolidation, pleural effusion, or pneumothorax. No displaced rib fractures. Advanced arthritis both shoulders. IMPRESSION: No active disease. Electronically Signed   By: Minerva Fester M.D.   On: 12/18/2022 18:02    Procedures .Critical Care  Performed by: Linwood Dibbles, MD Authorized by: Linwood Dibbles, MD   Critical care provider statement:    Critical care time (minutes):  45   Critical care was necessary to treat or prevent imminent or life-threatening deterioration of  the following conditions:  Circulatory failure   Critical care was time spent personally by me on the following activities:  Development of treatment plan with patient or surrogate, discussions with consultants, evaluation of patient's response to treatment, examination of patient, ordering and review of laboratory studies, ordering and review of radiographic studies, ordering and performing treatments and interventions, pulse oximetry, re-evaluation of patient's condition and review of old charts     Medications Ordered in ED Medications  lactated ringers infusion ( Intravenous New Bag/Given 12/18/22 1834)  potassium chloride SA (KLOR-CON M) CR tablet 20 mEq (has no administration in time range)  lactated ringers bolus 1,000 mL (1,000 mLs Intravenous New Bag/Given 12/18/22 2244)  lactated ringers infusion (has no administration in time range)  lactated ringers bolus 1,000 mL (has no administration in time range)  ceFEPIme (MAXIPIME) 2 g in sodium chloride 0.9 % 100 mL IVPB (2 g Intravenous New Bag/Given 12/18/22 2244)  metroNIDAZOLE (FLAGYL) IVPB 500 mg (500 mg Intravenous New Bag/Given 12/18/22 2245)  vancomycin (VANCOCIN) IVPB 1000 mg/200 mL premix (has no administration in time range)  heparin bolus via infusion 3,000 Units (has no administration in time range)  heparin ADULT infusion 100 units/mL (25000 units/228mL) (has no administration in time range)  lactated ringers bolus 1,000 mL (1,000 mLs Intravenous New Bag/Given 12/18/22 1832)  iohexol (OMNIPAQUE) 350 MG/ML injection 75 mL (75 mLs Intravenous Contrast Given 12/18/22 2137)    ED Course/ Medical Decision Making/ A&P Clinical Course as of 12/18/22 2312  Fri Dec 18, 2022  1919 CBC with Differential(!) Anemia noted, hemoglobin similar to previous [JK]  1919 Comprehensive metabolic panel(!) Metabolic panel shows hypokalemia 3.1 [JK]  2042 Chest x-ray without signs of pneumonia [JK]  2150 Blood pressure noted to be decreased.  Will  give additional fluid bolus [JK]  2223 Ct shows emboli, clot burdern is small, ?right heart strain, possible pna [JK]  2239 High risk S-pesi score. Will consult pccm [JK]    Clinical Course User Index [JK] Linwood Dibbles, MD                             Medical Decision Making Problems Addressed:  Acute pulmonary embolism, unspecified pulmonary embolism type, unspecified whether acute cor pulmonale present [facility]): acute illness or injury that poses a threat to life or bodily functions Pneumonia due to infectious organism, unspecified laterality, unspecified part of lung: acute illness or injury that poses a threat to life or bodily functions Pressure injury of skin of sacral region, unspecified injury stage: undiagnosed new problem with uncertain prognosis  Amount and/or Complexity of Data Reviewed Labs: ordered. Decision-making details documented in ED Course. Radiology: ordered. ECG/medicine tests: ordered.  Risk Prescription drug management.   Patient presented to the ER for evaluation of abnormal vital signs.  Patient has advanced dementia.  Initially in the ED was noted to be mildly tachycardic with a normal blood pressure.  Patient did not have an oxygen requirement.  However while in the ED he has had decreasing blood pressures down into the 80s and 90s systolics.  Current MAP is greater than 70.  Patient noted to have elevated lactic acid level.  He also has a slightly elevated troponin.  With his immobility and tachycardia I was concerned about the possibility of pulmonary embolism so a CT angiogram was performed.  It does show evidence of pulmonary embolism.  There is some question of right heart strain although the clot burden overall is low.  There are also some findings of pneumonia.  Patient also has a large decubitus ulcer in the sacrum and his right lower extremity.  There is evidence of significant breakdown.  There is malodorous drainage and question of some viability of the  tissue.  This could also be a source of infection for him.  Patient has been started on broad-spectrum antibiotics.  I have also started him on heparin for his pulmonary embolism.  With his elevated troponin and elevated simplified Pesi score.  Case discussed with Dr Delton Coombes.  Would proceed with formal echo before deciding on intervention regarding the PE. Will consult regarding his sepsis presentation and infection.   Final Clinical Impression(s) / ED Diagnoses Final diagnoses:  Acute pulmonary embolism, unspecified pulmonary embolism type, unspecified whether acute cor pulmonale present (HCC)  Pneumonia due to infectious organism, unspecified laterality, unspecified part of lung  Pressure injury of skin of sacral region, unspecified injury stage  Decubitus ulcer of lower extremity, unspecified pressure ulcer stage    Rx / DC Orders ED Discharge Orders     None            Linwood Dibbles, MD 12/18/22 2314

## 2022-12-18 NOTE — ED Triage Notes (Signed)
Pt BIBA with c/o SOB and cough. Hx dementia. Seen 3 weeks ago for a cough. No fever. Wound on outer right ankle.  20G L FA, given 500cc NS. On 4L. O2 increased to 94%   BP 90/48 HR 120-130 89% RA RR 34 CBG 133

## 2022-12-18 NOTE — H&P (Incomplete)
NAME:  Edwin [name], MRN:  [id number], DOB:  Oct 13, 1933, LOS: 0 ADMISSION DATE:  12/18/2022, CONSULTATION DATE: 12/18/2022 REFERRING MD: Dr. Lynelle Doctor, CHIEF COMPLAINT: Pneumonia, pulmonary embolism  History of Present Illness:  89 year old man with history of dementia, right hip fracture/hemiarthroplasty 05/2022, bedridden and nonverbal at baseline.  Family is able to assist him into a wheelchair intermittently and he has home health assistance.  He has reportedly had ongoing cough for a few weeks.  There was concern that he was worsening and EMS was called.  In the emergency department he has been relatively hypotensive and tachycardic.  Noted to have a large sacral decubitus ulcer, right lower extremity decubitus ulcer with significant breakdown and malodorous drainage.  CT-PA chest identified scattered subsegmental bronchial impaction posterior left lower lobe, to a lesser degree posterior right lower lobe as well as small bilateral segmental and subsegmental pulmonary embolism.  RV/LV ratio 1.2 suggesting right heart strain.  He has received vancomycin and cefepime, started on heparin infusion, volume resuscitation.  Lactic acid slightly elevated 3.5, troponin 59, WBC 3.9 with a left shift.   Pertinent  Medical History   Past Medical History:  Diagnosis Date  . Dementia (HCC)     Significant Hospital Events: Including procedures, antibiotic start and stop dates in addition to other pertinent events   CT-PA 7/12 >>   Interim History / Subjective:    Objective   Blood pressure 99/78, pulse (!) 102, temperature 100 F (37.8 C), temperature source Rectal, resp. rate (!) 25, height 5\' 11"  (1.803 m), weight 65.8 kg, SpO2 98%.       No intake or output data in the 24 hours ending 12/18/22 2325 Filed Weights   12/18/22 1840  Weight: 65.8 kg    Examination: General: Cachectic ill-appearing man laying in bed, somewhat uncomfortable HENT: Oropharynx dry, no stridor.  Pupils equal Lungs:  Decreased to both bases, no crackles or wheezes Cardiovascular: Tachycardic, regular, distant, no murmur Abdomen: Nondistended, hypoactive bowel sounds heard Extremities: Decubitus ulcer posterior right lower extremity superior to Achilles with some malodorous discharge Skin: Right lower extremity ulcer as above, large stage IV sacral decubitus ulcer with some malodorous discharge Neuro: Awake, eyes open, pupils equal.  He does not speak or follow commands.  Able to spontaneously move bilateral upper extremities, appears purposeful  Resolved Hospital Problem list     Assessment & Plan:   Sepsis, likely source sacral and right lower extremity decubitus ulcers, also left lower lobe pneumonia -Continue volume resuscitation -Follow lactic acid trend -Agree with broad-spectrum antibiotics cefepime, vancomycin -Blood cultures, urine culture.  Tailor antibiotics based on results -Procalcitonin -Wound care consultation -Will obtain CT pelvis and right lower extremity to ensure no evidence of gas formation, necrotizing fasciitis.   Pulmonary emboli with evidence for RV strain -Agree with heparin infusion -Follow troponin trend -Echocardiogram.  Based on RV function, oxygen needs, hemodynamics could consider consultation with IR for targeted lytics.  Would defer for now as, based on clot burden, I believe infection is driving his illness and presentation  Mild leukopenia, likely due to sepsis -Supportive care, treat underlying infection -Follow CBC  Hypokalemia -Replete potassium carefully -Follow BMP and urine output, at risk renal failure  Dementia -Supportive care -Avoid sedating medications  At risk malnutrition -N.p.o. for now until we can perform adequate swallowing evaluation    Best Practice (right click and "Reselect all SmartList Selections" daily)   Diet/type: NPO DVT prophylaxis: systemic heparin GI prophylaxis: PPI Lines: N/A Foley:  N/A Code  Status:  {Code  Status:26939} Last date of multidisciplinary goals of care discussion [***]  Labs   CBC: Recent Labs  Lab 12/18/22 1807  WBC 9.1  NEUTROABS 8.4*  HGB 9.4*  HCT 29.2*  MCV 74.9*  PLT 153    Basic Metabolic Panel: Recent Labs  Lab 12/18/22 1807  NA 137  K 3.1*  CL 104  CO2 24  GLUCOSE 111*  BUN 11  CREATININE 0.81  CALCIUM 9.5   GFR: Estimated Creatinine Clearance: 60.9 mL/min (by C-G formula based on SCr of 0.81 mg/dL). Recent Labs  Lab 12/18/22 1807 12/18/22 2125  WBC 9.1  --   LATICACIDVEN  --  3.5*    Liver Function Tests: Recent Labs  Lab 12/18/22 1807  AST 43*  ALT 28  ALKPHOS 73  BILITOT 1.0  PROT 7.2  ALBUMIN 2.2*   No results for input(s): "LIPASE", "AMYLASE" in the last 168 hours. No results for input(s): "AMMONIA" in the last 168 hours.  ABG No results found for: "PHART", "PCO2ART", "PO2ART", "HCO3", "TCO2", "ACIDBASEDEF", "O2SAT"   Coagulation Profile: Recent Labs  Lab 12/18/22 1807  INR 1.3*    Cardiac Enzymes: No results for input(s): "CKTOTAL", "CKMB", "CKMBINDEX", "TROPONINI" in the last 168 hours.  HbA1C: No results found for: "HGBA1C"  CBG: No results for input(s): "GLUCAP" in the last 168 hours.  Review of Systems:   ***  Past Medical History:  He,  has a past medical history of Dementia (HCC).   Surgical History:   Past Surgical History:  Procedure Laterality Date  . HIP ARTHROPLASTY Right 07/27/2021   Procedure: ARTHROPLASTY BIPOLAR HIP (HEMIARTHROPLASTY);  Surgeon: Sheral Apley, MD;  Location: [facility];  Service: Orthopedics;  Laterality: Right;  . HIP ARTHROPLASTY Right 05/30/2022   Procedure: RIGHT HIP HEMIARTHROPLASTY REVISION;  Surgeon: Sheral Apley, MD;  Location: [facility];  Service: Orthopedics;  Laterality: Right;     Social History:   reports that he has never smoked. He has never used smokeless tobacco. He reports that he does not currently use alcohol. He reports that he does not currently use  drugs.   Family History:  His family history includes Dementia in his brother and sister.   Allergies Allergies  Allergen Reactions  . Milk-Related Compounds Other (See Comments)    NO DAIRY, NO YOGURT, NO ICE CREAM, NO CHEESE, NO MILK (PREFERENCE)  . Beef-Derived Products Other (See Comments)    Requested by wife.  . Egg-Derived Products Other (See Comments)    Patient wife states they are vegetarian, eggs are not apart of their diet.   . Other Other (See Comments)    Unnamed antibiotic- "Made him sick"- no further elaboration  Patient prefers to not take medication, in general     Home Medications  Prior to Admission medications   Medication Sig Start Date End Date Taking? Authorizing Provider  docusate (COLACE) 50 MG/5ML liquid Take 10 mLs (100 mg total) by mouth 2 (two) times daily. 06/08/22   Lanae Boast, MD  enoxaparin (LOVENOX) 40 MG/0.4ML injection Inject 0.4 mLs (40 mg total) into the skin daily for 18 days. 06/03/22 06/21/22  Hughie Closs, MD  HYDROcodone-acetaminophen (NORCO/VICODIN) 5-325 MG tablet Take 1 tablet by mouth every 6 (six) hours as needed for moderate pain (pain score 4-6). 06/02/22   Hughie Closs, MD  Multiple Vitamin (MULTIVITAMIN WITH MINERALS) TABS tablet Take 1 tablet by mouth daily. 06/09/22   Lanae Boast, MD  Nutritional Supplements (FEEDING SUPPLEMENT, KATE FARMS STANDARD 1.4,)  LIQD liquid Take 325 mLs by mouth 2 (two) times daily between meals. 06/08/22   Lanae Boast, MD     Critical care time: ***     Levy Pupa, MD, PhD 12/18/2022, 11:25 PM [facility] [phone] or if no answer before 7:00PM call [phone] For any issues after 7:00PM please call eLink [phone]

## 2022-12-19 ENCOUNTER — Encounter (HOSPITAL_COMMUNITY): Payer: Self-pay | Admitting: Emergency Medicine

## 2022-12-19 ENCOUNTER — Inpatient Hospital Stay (HOSPITAL_COMMUNITY): Payer: Medicare HMO

## 2022-12-19 DIAGNOSIS — L89159 Pressure ulcer of sacral region, unspecified stage: Secondary | ICD-10-CM

## 2022-12-19 DIAGNOSIS — I2609 Other pulmonary embolism with acute cor pulmonale: Secondary | ICD-10-CM

## 2022-12-19 DIAGNOSIS — I2699 Other pulmonary embolism without acute cor pulmonale: Secondary | ICD-10-CM | POA: Insufficient documentation

## 2022-12-19 DIAGNOSIS — A419 Sepsis, unspecified organism: Secondary | ICD-10-CM | POA: Diagnosis not present

## 2022-12-19 DIAGNOSIS — R6521 Severe sepsis with septic shock: Secondary | ICD-10-CM | POA: Diagnosis not present

## 2022-12-19 LAB — ECHOCARDIOGRAM COMPLETE
AR max vel: 2.83 cm2
AV Area VTI: 2.81 cm2
AV Area mean vel: 2.48 cm2
AV Mean grad: 2 mmHg
AV Peak grad: 2.4 mmHg
Ao pk vel: 0.78 m/s
Area-P 1/2: 3.39 cm2
Calc EF: 54.6 %
Height: 71 in
MV VTI: 3.3 cm2
S' Lateral: 3.4 cm
Single Plane A2C EF: 54.9 %
Single Plane A4C EF: 53.1 %
Weight: 2070.56 oz

## 2022-12-19 LAB — C DIFFICILE QUICK SCREEN W PCR REFLEX
C Diff antigen: NEGATIVE
C Diff interpretation: NOT DETECTED
C Diff toxin: NEGATIVE

## 2022-12-19 LAB — BLOOD CULTURE ID PANEL (REFLEXED) - BCID2
A.calcoaceticus-baumannii: NOT DETECTED
Bacteroides fragilis: NOT DETECTED
CTX-M ESBL: NOT DETECTED
Candida albicans: NOT DETECTED
Candida auris: NOT DETECTED
Candida glabrata: NOT DETECTED
Candida krusei: NOT DETECTED
Candida parapsilosis: NOT DETECTED
Candida tropicalis: NOT DETECTED
Carbapenem resist OXA 48 LIKE: NOT DETECTED
Carbapenem resistance IMP: NOT DETECTED
Carbapenem resistance KPC: NOT DETECTED
Carbapenem resistance NDM: NOT DETECTED
Carbapenem resistance VIM: NOT DETECTED
Cryptococcus neoformans/gattii: NOT DETECTED
Enterobacter cloacae complex: NOT DETECTED
Enterobacterales: DETECTED — AB
Enterococcus Faecium: NOT DETECTED
Enterococcus faecalis: DETECTED — AB
Escherichia coli: DETECTED — AB
Haemophilus influenzae: NOT DETECTED
Klebsiella aerogenes: NOT DETECTED
Klebsiella oxytoca: NOT DETECTED
Klebsiella pneumoniae: NOT DETECTED
Listeria monocytogenes: NOT DETECTED
Neisseria meningitidis: NOT DETECTED
Proteus species: NOT DETECTED
Pseudomonas aeruginosa: NOT DETECTED
Salmonella species: NOT DETECTED
Serratia marcescens: NOT DETECTED
Staphylococcus aureus (BCID): NOT DETECTED
Staphylococcus epidermidis: NOT DETECTED
Staphylococcus lugdunensis: NOT DETECTED
Staphylococcus species: NOT DETECTED
Stenotrophomonas maltophilia: NOT DETECTED
Streptococcus agalactiae: NOT DETECTED
Streptococcus pneumoniae: NOT DETECTED
Streptococcus pyogenes: NOT DETECTED
Streptococcus species: NOT DETECTED
Vancomycin resistance: NOT DETECTED

## 2022-12-19 LAB — CBC
HCT: 25.5 % — ABNORMAL LOW (ref 39.0–52.0)
Hemoglobin: 8.2 g/dL — ABNORMAL LOW (ref 13.0–17.0)
MCH: 24.7 pg — ABNORMAL LOW (ref 26.0–34.0)
MCHC: 32.2 g/dL (ref 30.0–36.0)
MCV: 76.8 fL — ABNORMAL LOW (ref 80.0–100.0)
Platelets: 129 10*3/uL — ABNORMAL LOW (ref 150–400)
RBC: 3.32 MIL/uL — ABNORMAL LOW (ref 4.22–5.81)
RDW: 17.5 % — ABNORMAL HIGH (ref 11.5–15.5)
WBC: 24 10*3/uL — ABNORMAL HIGH (ref 4.0–10.5)
nRBC: 0.1 % (ref 0.0–0.2)

## 2022-12-19 LAB — LACTIC ACID, PLASMA
Lactic Acid, Venous: 4.4 mmol/L (ref 0.5–1.9)
Lactic Acid, Venous: 3.7 mmol/L (ref 0.5–1.9)

## 2022-12-19 LAB — BASIC METABOLIC PANEL
Anion gap: 9 (ref 5–15)
BUN: 11 mg/dL (ref 8–23)
CO2: 22 mmol/L (ref 22–32)
Calcium: 8.9 mg/dL (ref 8.9–10.3)
Chloride: 108 mmol/L (ref 98–111)
Creatinine, Ser: 0.61 mg/dL (ref 0.61–1.24)
GFR, Estimated: >60 mL/min (ref >60)
Glucose, Bld: 98 mg/dL (ref 70–99)
Potassium: 3 mmol/L — ABNORMAL LOW (ref 3.5–5.1)
Sodium: 139 mmol/L (ref 135–145)

## 2022-12-19 LAB — HEPATIC FUNCTION PANEL
ALT: 26 U/L (ref 0–44)
AST: 42 U/L — ABNORMAL HIGH (ref 15–41)
Albumin: 1.6 g/dL — ABNORMAL LOW (ref 3.5–5.0)
Alkaline Phosphatase: 63 U/L (ref 38–126)
Bilirubin, Direct: 0.3 mg/dL — ABNORMAL HIGH (ref 0.0–0.2)
Indirect Bilirubin: 0.5 mg/dL (ref 0.3–0.9)
Total Bilirubin: 0.8 mg/dL (ref 0.3–1.2)
Total Protein: 5.2 g/dL — ABNORMAL LOW (ref 6.5–8.1)

## 2022-12-19 LAB — TROPONIN I (HIGH SENSITIVITY): Troponin I (High Sensitivity): 48 ng/L — ABNORMAL HIGH (ref <18)

## 2022-12-19 LAB — PHOSPHORUS: Phosphorus: 1.6 mg/dL — ABNORMAL LOW (ref 2.5–4.6)

## 2022-12-19 LAB — HEPARIN LEVEL (UNFRACTIONATED)
Heparin Unfractionated: 0.36 IU/mL (ref 0.30–0.70)
Heparin Unfractionated: 0.23 IU/mL — ABNORMAL LOW (ref 0.30–0.70)

## 2022-12-19 LAB — MAGNESIUM: Magnesium: 2.1 mg/dL (ref 1.7–2.4)

## 2022-12-19 LAB — MRSA NEXT GEN BY PCR, NASAL: MRSA by PCR Next Gen: NOT DETECTED

## 2022-12-19 IMAGING — DX DG PORTABLE PELVIS
1 series · 1 of 1 positions shown · non-contrast
Comparison: 07/26/2021

CLINICAL DATA: Right hip replacement

EXAM:
PORTABLE PELVIS 1-2 VIEWS

[pelvis ap]
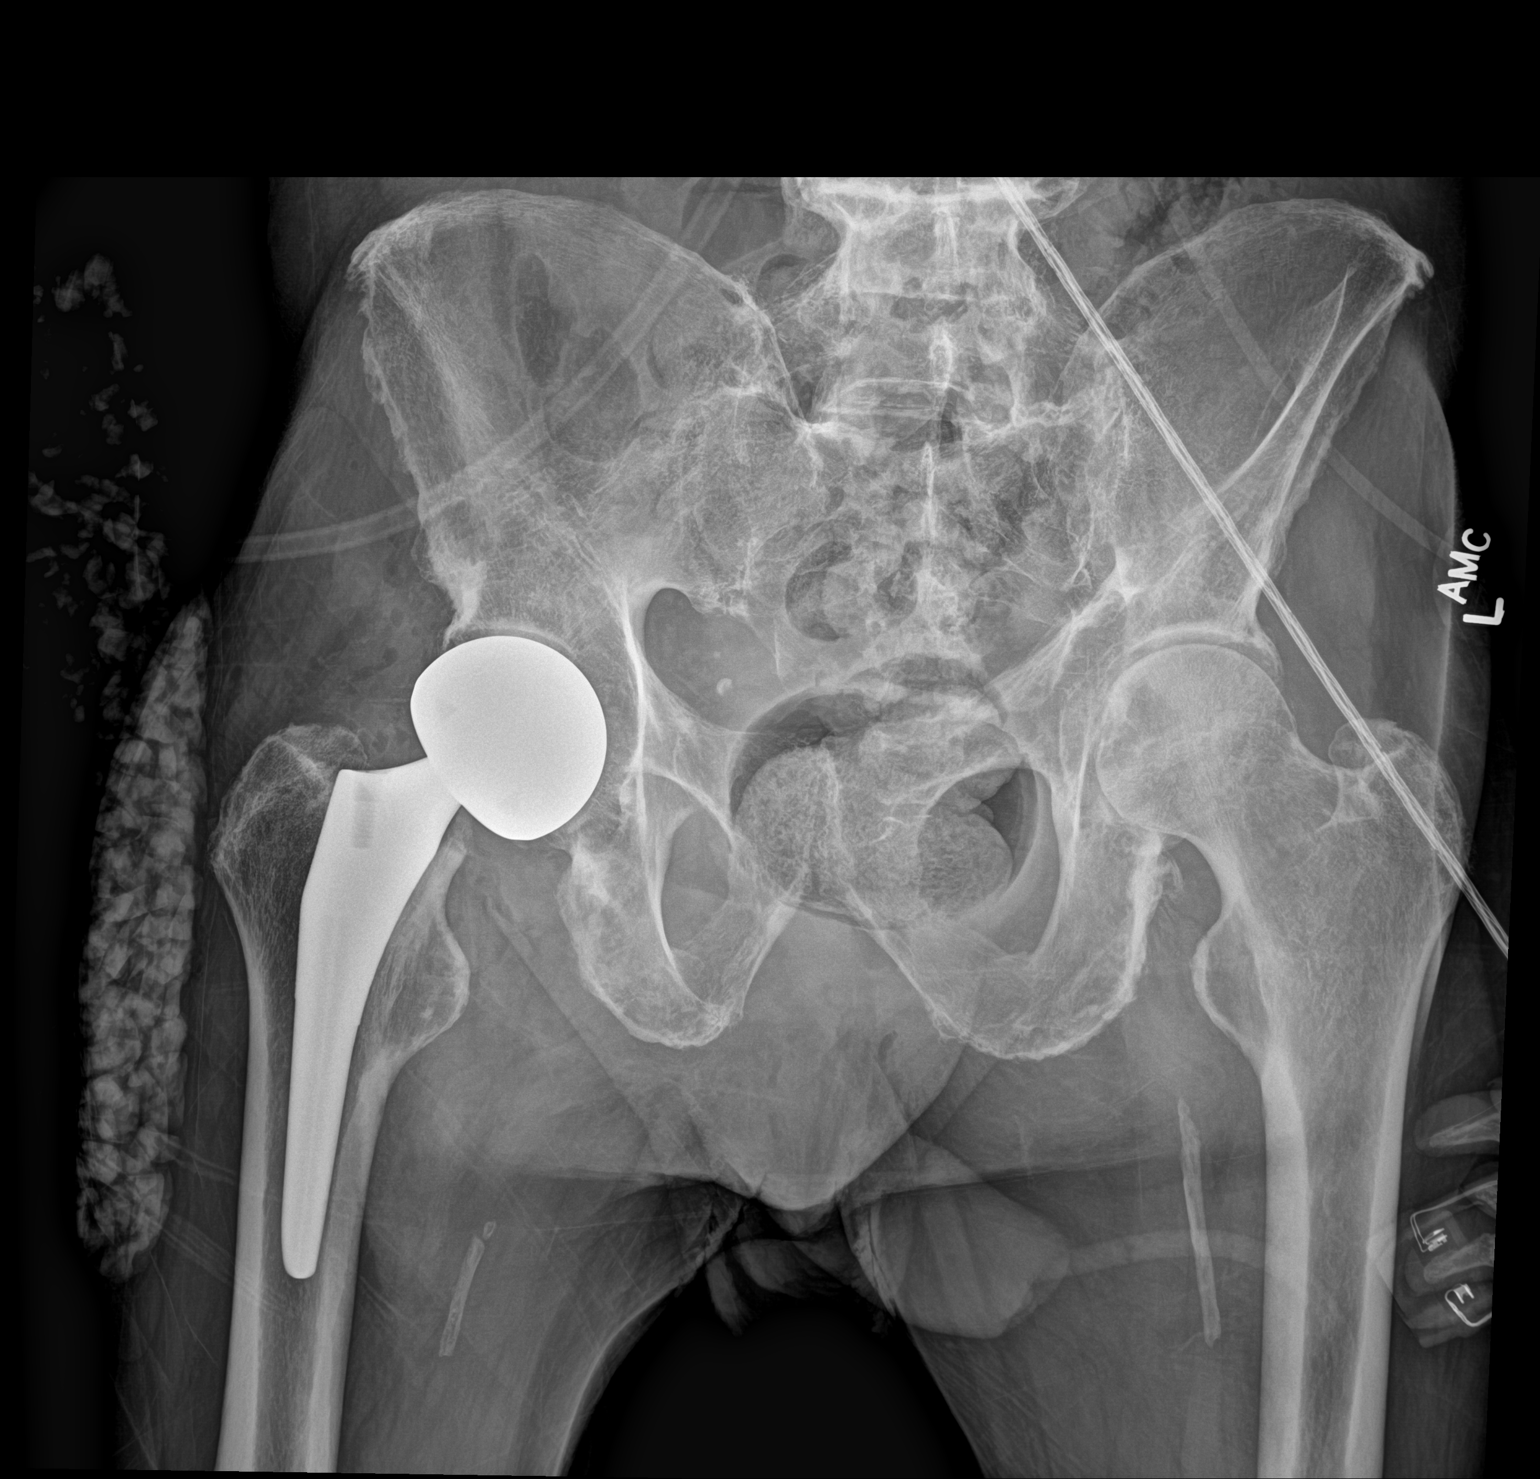

[1 of 1 positions shown; findings below may reference images not displayed]

FINDINGS: Frontal pelvis shows interval right hip hemiarthroplasty. Bones are
diffusely demineralized. Degenerative changes noted lower lumbar
spine.
IMPRESSION: Interval right hip hemiarthroplasty.

## 2022-12-19 MED ORDER — ORAL CARE MOUTH RINSE: 15.0000 mL | OROMUCOSAL | Status: DC | PRN

## 2022-12-19 MED ORDER — POLYETHYLENE GLYCOL 3350 17 G PO PACK: 17.0000 g | PACK | Freq: Every day | ORAL | Status: DC | PRN

## 2022-12-19 MED ORDER — PIPERACILLIN-TAZOBACTAM 3.375 G IVPB
3.3750 g | Freq: Three times a day (TID) | INTRAVENOUS | Status: DC
  Administered 2022-12-19: 3.375 g via INTRAVENOUS
  Filled 2022-12-19 (×2): qty 50

## 2022-12-19 MED ORDER — LACTATED RINGERS IV BOLUS
1000.0000 mL | Freq: Once | INTRAVENOUS | Status: AC
  Administered 2022-12-19: 1000 mL via INTRAVENOUS

## 2022-12-19 MED ORDER — SODIUM CHLORIDE 0.9 % IV SOLN: INTRAVENOUS | Status: DC | PRN

## 2022-12-19 MED ORDER — VANCOMYCIN HCL 1250 MG/250ML IV SOLN: 1250.0000 mg | INTRAVENOUS | Status: DC

## 2022-12-19 MED ORDER — SODIUM CHLORIDE 0.9 % IV SOLN
250.0000 mL | INTRAVENOUS | Status: DC
  Administered 2022-12-23: 250 mL via INTRAVENOUS

## 2022-12-19 MED ORDER — CHLORHEXIDINE GLUCONATE CLOTH 2 % EX PADS
6.0000 | MEDICATED_PAD | Freq: Every day | CUTANEOUS | Status: DC
  Administered 2022-12-19 – 2022-12-20 (×2): 6 via TOPICAL

## 2022-12-19 MED ORDER — POTASSIUM CHLORIDE 10 MEQ/100ML IV SOLN
10.0000 meq | INTRAVENOUS | Status: AC
  Administered 2022-12-19 (×4): 10 meq via INTRAVENOUS
  Filled 2022-12-19 (×4): qty 100

## 2022-12-19 MED ORDER — NOREPINEPHRINE 4 MG/250ML-% IV SOLN
2.0000 ug/min | INTRAVENOUS | Status: DC
  Administered 2022-12-19: 2 ug/min via INTRAVENOUS
  Filled 2022-12-19: qty 250

## 2022-12-19 MED ORDER — METRONIDAZOLE 500 MG/100ML IV SOLN
500.0000 mg | Freq: Two times a day (BID) | INTRAVENOUS | Status: DC
  Administered 2022-12-19 – 2022-12-21 (×4): 500 mg via INTRAVENOUS
  Filled 2022-12-19 (×4): qty 100

## 2022-12-19 MED ORDER — SODIUM CHLORIDE 0.9 % IV SOLN
2.0000 g | Freq: Two times a day (BID) | INTRAVENOUS | Status: DC
  Administered 2022-12-19 – 2022-12-21 (×4): 2 g via INTRAVENOUS
  Filled 2022-12-19 (×4): qty 20

## 2022-12-19 MED ORDER — SODIUM CHLORIDE 0.9 % IV SOLN
2.0000 g | INTRAVENOUS | Status: DC
  Administered 2022-12-19 – 2022-12-21 (×11): 2 g via INTRAVENOUS
  Filled 2022-12-19 (×14): qty 2000

## 2022-12-19 MED ORDER — POTASSIUM PHOSPHATES 15 MMOLE/5ML IV SOLN
45.0000 mmol | Freq: Once | INTRAVENOUS | Status: AC
  Administered 2022-12-19: 45 mmol via INTRAVENOUS
  Filled 2022-12-19: qty 15

## 2022-12-19 MED ORDER — SODIUM CHLORIDE 0.9 % IV SOLN
2.0000 g | Freq: Three times a day (TID) | INTRAVENOUS | Status: DC
  Administered 2022-12-19: 2 g via INTRAVENOUS
  Filled 2022-12-19: qty 12.5

## 2022-12-19 MED ORDER — VASHE WOUND IRRIGATION OPTIME
8.5000 [oz_av] | Freq: Four times a day (QID) | TOPICAL | Status: DC | PRN
  Administered 2022-12-20: 8.5 [oz_av] via TOPICAL
  Filled 2022-12-19: qty 9

## 2022-12-19 MED ORDER — FENTANYL CITRATE PF 50 MCG/ML IJ SOSY
25.0000 ug | PREFILLED_SYRINGE | Freq: Three times a day (TID) | INTRAMUSCULAR | Status: DC | PRN
  Administered 2022-12-19: 50 ug via INTRAVENOUS
  Administered 2022-12-20: 25 ug via INTRAVENOUS
  Administered 2022-12-21 – 2022-12-23 (×5): 50 ug via INTRAVENOUS
  Filled 2022-12-19 (×9): qty 1

## 2022-12-19 MED ORDER — DOCUSATE SODIUM 100 MG PO CAPS: 100.0000 mg | ORAL_CAPSULE | Freq: Two times a day (BID) | ORAL | Status: DC | PRN

## 2022-12-19 MED ORDER — ORAL CARE MOUTH RINSE
15.0000 mL | OROMUCOSAL | Status: DC
  Administered 2022-12-19 (×3): 15 mL via OROMUCOSAL

## 2022-12-19 MED ORDER — ONDANSETRON HCL 4 MG/2ML IJ SOLN: 4.0000 mg | Freq: Four times a day (QID) | INTRAMUSCULAR | Status: DC | PRN

## 2022-12-19 NOTE — Procedures (Signed)
Arterial Catheter Insertion Procedure Note  Edwin [name]  [id number]  Oct 26, 1933  Date:12/19/22  Time:5:42 AM    Provider Performing: Hassan Buckler    Procedure: Insertion of Arterial Line (40981) without US guidance  Indication(s) Blood pressure monitoring and/or need for frequent ABGs  Consent Risks of the procedure as well as the alternatives and risks of each were explained to the patient and/or caregiver.  Consent for the procedure was obtained and is signed in the bedside chart  Anesthesia None   Time Out Verified patient identification, verified procedure, site/side was marked, verified correct patient position, special equipment/implants available, medications/allergies/relevant history reviewed, required imaging and test results available.   Sterile Technique Maximal sterile technique including full sterile barrier drape, hand hygiene, sterile gown, sterile gloves, mask, hair covering, sterile ultrasound probe cover (if used).   Procedure Description Area of catheter insertion was cleaned with chlorhexidine and draped in sterile fashion. Without real-time ultrasound guidance an arterial catheter was placed into the right radial artery.  Appropriate arterial tracings confirmed on monitor.     Complications/Tolerance None; patient tolerated the procedure well.   EBL Minimal   Specimen(s) None

## 2022-12-19 NOTE — Consult Note (Signed)
Reason for Consult: Evaluate wounds Referring Physician: Delton Coombes MD  Edwin [name] is an 89 y.o. male.  HPI: Asked to see 89 year old male with admission for hypotension and sepsis.  He is quite debilitated since a fall last year.  He developed a wound to his right lower extremity and over sacrum.  The wound was rather extremities been there for a number of months and has been treated medically.  The sacral wound the patient's family states has been there is for a month or more.  He is not mobile.  He is bedridden.  He is minimally responsive today to conversation.  Past Medical History:  Diagnosis Date   Dementia [facility])     Past Surgical History:  Procedure Laterality Date   HIP ARTHROPLASTY Right 07/27/2021   Procedure: ARTHROPLASTY BIPOLAR HIP (HEMIARTHROPLASTY);  Surgeon: Sheral Apley, MD;  Location: [facility] OR[facility];  Service: Orthopedics;  Laterality: Right;   HIP ARTHROPLASTY Right 05/30/2022   Procedure: RIGHT HIP HEMIARTHROPLASTY REVISION;  Surgeon: Sheral Apley, MD;  Location: [facility] OR;  Service: Orthopedics;  Laterality: Right;    Family History  Problem Relation Age of Onset   Dementia Sister    Dementia Brother     Social History:  reports that he has never smoked. He has never used smokeless tobacco. He reports that he does not currently use alcohol. He reports that he does not currently use drugs.  Allergies:  Allergies  Allergen Reactions   Milk-Related Compounds Other (See Comments)    NO DAIRY, NO YOGURT, NO ICE CREAM, NO CHEESE, NO MILK (PREFERENCE)   Beef-Derived Products Other (See Comments)    Requested by wife.   Egg-Derived Products Other (See Comments)    Patient wife states they are vegetarian, eggs are not apart of their diet.    Other Other (See Comments)    Unnamed antibiotic- "Made him sick"- no further elaboration  Patient prefers to not take medication, in general    Medications: I have reviewed the patient's current medications.  Results for  orders placed or performed during the hospital encounter of 12/18/22 (from the past 48 hour(s))  Comprehensive metabolic panel     Status: Abnormal   Collection Time: 12/18/22  6:07 PM  Result Value Ref Range   Sodium 137 135 - 145 mmol/L   Potassium 3.1 (L) 3.5 - 5.1 mmol/L   Chloride 104 98 - 111 mmol/L   CO2 24 22 - 32 mmol/L   Glucose, Bld 111 (H) 70 - 99 mg/dL    Comment: Glucose reference range applies only to samples taken after fasting for at least 8 hours.   BUN 11 8 - 23 mg/dL   Creatinine, Ser 1.61 0.61 - 1.24 mg/dL   Calcium 9.5 8.9 - 09.6 mg/dL   Total Protein 7.2 6.5 - 8.1 g/dL   Albumin 2.2 (L) 3.5 - 5.0 g/dL   AST 43 (H) 15 - 41 U/L   ALT 28 0 - 44 U/L   Alkaline Phosphatase 73 38 - 126 U/L   Total Bilirubin 1.0 0.3 - 1.2 mg/dL   GFR, Estimated >04 >54 mL/min    Comment: (NOTE) Calculated using the CKD-EPI Creatinine Equation (2021)    Anion gap 9 5 - 15    Comment: Performed at [facility], [address], [place], [place] [address]  CBC with Differential     Status: Abnormal   Collection Time: 12/18/22  6:07 PM  Result Value Ref Range   WBC 9.1 4.0 -  10.5 K/uL   RBC 3.90 (L) 4.22 - 5.81 MIL/uL   Hemoglobin 9.4 (L) 13.0 - 17.0 g/dL   HCT 16.1 (L) 09.6 - 04.5 %   MCV 74.9 (L) 80.0 - 100.0 fL   MCH 24.1 (L) 26.0 - 34.0 pg   MCHC 32.2 30.0 - 36.0 g/dL   RDW 40.9 (H) 81.1 - 91.4 %   Platelets 153 150 - 400 K/uL   nRBC 0.4 (H) 0.0 - 0.2 %   Neutrophils Relative % 92 %   Neutro Abs 8.4 (H) 1.7 - 7.7 K/uL   Lymphocytes Relative 6 %   Lymphs Abs 0.5 (L) 0.7 - 4.0 K/uL   Monocytes Relative 1 %   Monocytes Absolute 0.1 0.1 - 1.0 K/uL   Eosinophils Relative 0 %   Eosinophils Absolute 0.0 0.0 - 0.5 K/uL   Basophils Relative 0 %   Basophils Absolute 0.0 0.0 - 0.1 K/uL   Immature Granulocytes 1 %   Abs Immature Granulocytes 0.06 0.00 - 0.07 K/uL   Polychromasia PRESENT    Target Cells PRESENT     Comment: Performed at [facility], [address], [place], [place] [address]  Protime-INR     Status: Abnormal   Collection Time: 12/18/22  6:07 PM  Result Value Ref Range   Prothrombin Time 16.7 (H) 11.4 - 15.2 seconds   INR 1.3 (H) 0.8 - 1.2    Comment: (NOTE) INR goal varies based on device and disease states. Performed at [facility], [address] [place], [place] [address]   APTT     Status: None   Collection Time: 12/18/22  6:07 PM  Result Value Ref Range   aPTT 28 24 - 36 seconds    Comment: Performed at [facility], [address] [place], [place] [address]  Blood Culture (routine x 2)     Status: None (Preliminary result)   Collection Time: 12/18/22  6:18 PM   Specimen: BLOOD RIGHT WRIST  Result Value Ref Range   Specimen Description      BLOOD RIGHT WRIST Performed at [facility], [address], [place], [place] [address]    Special Requests      BOTTLES DRAWN AEROBIC AND ANAEROBIC Blood Culture adequate volume Performed at [facility], [address], [place], [place] [address]    Culture  Setup Time      GRAM NEGATIVE RODS GRAM POSITIVE COCCI IN BOTH AEROBIC AND ANAEROBIC BOTTLES CRITICAL RESULT CALLED TO, READ BACK BY AND VERIFIED WITH: Nathanial Millman [facility] [phone] 1232 BY Berline Chough, MT Performed at [facility], [address], [place], [place] [address]    Culture GRAM NEGATIVE RODS GRAM POSITIVE COCCI     Report Status PENDING   Blood Culture (routine x 2)     Status: None (Preliminary result)   Collection Time: 12/18/22  6:18 PM   Specimen: BLOOD  Result Value Ref Range   Specimen Description      BLOOD RIGHT ANTECUBITAL Performed at [facility], [address] [place], [place] [address]    Special Requests      BOTTLES DRAWN AEROBIC AND ANAEROBIC Blood Culture adequate volume Performed at [facility], [address] [place], [place] [address]    Culture   Setup Time      GRAM NEGATIVE RODS ANAEROBIC BOTTLE ONLY GRAM POSITIVE COCCI AND GNR AEROBIC BOTTLE ONLY CRITICAL VALUE NOTED.  VALUE IS CONSISTENT WITH PREVIOUSLY REPORTED AND CALLED VALUE. Performed at [facility], [address] [place], [place] [address]    Culture GRAM NEGATIVE RODS GRAM POSITIVE COCCI     Report Status PENDING   Blood Culture ID Panel (Reflexed)     Status: Abnormal   Collection Time: 12/18/22  6:18 PM  Result Value Ref Range   Enterococcus faecalis DETECTED (A) NOT DETECTED    Comment: CRITICAL RESULT CALLED TO, READ BACK BY AND VERIFIED WITH: PHARMD DREW WOFFORD [phone] 1232 BY J RAZZAK, MT    Enterococcus Faecium NOT DETECTED NOT DETECTED   Listeria monocytogenes NOT DETECTED NOT DETECTED   Staphylococcus species NOT DETECTED NOT DETECTED   Staphylococcus aureus (BCID) NOT DETECTED NOT DETECTED   Staphylococcus epidermidis NOT DETECTED NOT DETECTED   Staphylococcus lugdunensis NOT DETECTED NOT DETECTED   Streptococcus species NOT DETECTED NOT DETECTED   Streptococcus agalactiae NOT DETECTED NOT DETECTED   Streptococcus pneumoniae NOT DETECTED NOT DETECTED   Streptococcus pyogenes NOT DETECTED NOT DETECTED   A.calcoaceticus-baumannii NOT DETECTED NOT DETECTED   Bacteroides fragilis NOT DETECTED NOT DETECTED   Enterobacterales DETECTED (A) NOT DETECTED    Comment: Enterobacterales represent a large order of gram negative bacteria, not a single organism. CRITICAL RESULT CALLED TO, READ BACK BY AND VERIFIED WITH: PHARMD DREW WOFFORD [phone] 1232 BY J RAZZAK, MT    Enterobacter cloacae complex NOT DETECTED NOT DETECTED   Escherichia coli DETECTED (A) NOT DETECTED    Comment: CRITICAL RESULT CALLED TO, READ BACK BY AND VERIFIED WITH: PHARMD DREW WOFFORD [phone] 1232 BY J RAZZAK, MT    Klebsiella aerogenes NOT DETECTED NOT DETECTED   Klebsiella oxytoca NOT DETECTED NOT DETECTED   Klebsiella pneumoniae NOT DETECTED NOT DETECTED   Proteus  species NOT DETECTED NOT DETECTED   Salmonella species NOT DETECTED NOT DETECTED   Serratia marcescens NOT DETECTED NOT DETECTED   Haemophilus influenzae NOT DETECTED NOT DETECTED   Neisseria meningitidis NOT DETECTED NOT DETECTED   Pseudomonas aeruginosa NOT DETECTED NOT DETECTED   Stenotrophomonas maltophilia NOT DETECTED NOT DETECTED   Candida albicans NOT DETECTED NOT DETECTED   Candida auris NOT DETECTED NOT DETECTED   Candida glabrata NOT DETECTED NOT DETECTED   Candida krusei NOT DETECTED NOT DETECTED   Candida parapsilosis NOT DETECTED NOT DETECTED   Candida tropicalis NOT DETECTED NOT DETECTED   Cryptococcus neoformans/gattii NOT DETECTED NOT DETECTED   CTX-M ESBL NOT DETECTED NOT DETECTED   Carbapenem resistance IMP NOT DETECTED NOT DETECTED   Carbapenem resistance KPC NOT DETECTED NOT DETECTED   Carbapenem resistance NDM NOT DETECTED NOT DETECTED   Carbapenem resist OXA 48 LIKE NOT DETECTED NOT DETECTED   Vancomycin resistance NOT DETECTED NOT DETECTED   Carbapenem resistance VIM NOT DETECTED NOT DETECTED    Comment: Performed at [facility], [address], [place], [place] [address]  Troponin I (High Sensitivity)     Status: Abnormal   Collection Time: 12/18/22  9:25 PM  Result Value Ref Range   Troponin I (High Sensitivity) 59 (H) <18 ng/L    Comment: (NOTE) Elevated high sensitivity troponin I (hsTnI) values and significant  changes across serial measurements may suggest ACS but many other  chronic and acute conditions are known to elevate hsTnI results.  Refer to the "Links" section for chest pain algorithms and additional  guidance. Performed at [facility], [address] [place], [place] [address]   Lactic acid, plasma  Status: Abnormal   Collection Time: 12/18/22  9:25 PM  Result Value Ref Range   Lactic Acid, Venous 3.5 (HH) 0.5 - 1.9 mmol/L    Comment: CRITICAL RESULT CALLED TO, READ BACK BY AND VERIFIED WITH BLACKWELL S. RN  @2226  MCLEAN K. Performed at [facility], [address], [place], [place] [address]   Lactic acid, plasma     Status: Abnormal   Collection Time: 12/18/22 11:58 PM  Result Value Ref Range   Lactic Acid, Venous 4.4 (HH) 0.5 - 1.9 mmol/L    Comment: CRITICAL VALUE NOTED. VALUE IS CONSISTENT WITH PREVIOUSLY REPORTED/CALLED VALUE Performed at [facility], [address], [place], [place] [address]   MRSA Next Gen by PCR, Nasal     Status: None   Collection Time: 12/19/22  1:02 AM   Specimen: Nasal Mucosa; Nasal Swab  Result Value Ref Range   MRSA by PCR Next Gen NOT DETECTED NOT DETECTED    Comment: (NOTE) The GeneXpert MRSA Assay (FDA approved for NASAL specimens only), is one component of a comprehensive MRSA colonization surveillance program. It is not intended to diagnose MRSA infection nor to guide or monitor treatment for MRSA infections. Test performance is not FDA approved in patients less than 87 years old. Performed at [facility], [address], [place], [place] [address]   CBC     Status: Abnormal   Collection Time: 12/19/22  3:25 AM  Result Value Ref Range   WBC 24.0 (H) 4.0 - 10.5 K/uL   RBC 3.32 (L) 4.22 - 5.81 MIL/uL   Hemoglobin 8.2 (L) 13.0 - 17.0 g/dL    Comment: Reticulocyte Hemoglobin testing may be clinically indicated, consider ordering this additional test [id number]    HCT 25.5 (L) 39.0 - 52.0 %   MCV 76.8 (L) 80.0 - 100.0 fL   MCH 24.7 (L) 26.0 - 34.0 pg   MCHC 32.2 30.0 - 36.0 g/dL   RDW 62.1 (H) 30.8 - 65.7 %   Platelets 129 (L) 150 - 400 K/uL    Comment: SPECIMEN CHECKED FOR CLOTS REPEATED TO VERIFY    nRBC 0.1 0.0 - 0.2 %    Comment: Performed at [facility], [address] [place], [place] [address]  Basic metabolic panel     Status: Abnormal   Collection Time: 12/19/22  3:25 AM  Result Value Ref Range   Sodium 139 135 - 145 mmol/L   Potassium 3.0 (L) 3.5 - 5.1  mmol/L   Chloride 108 98 - 111 mmol/L   CO2 22 22 - 32 mmol/L   Glucose, Bld 98 70 - 99 mg/dL    Comment: Glucose reference range applies only to samples taken after fasting for at least 8 hours.   BUN 11 8 - 23 mg/dL   Creatinine, Ser 2.95 0.61 - 1.24 mg/dL   Calcium 8.9 8.9 - 28.4 mg/dL   GFR, Estimated >13 >24 mL/min    Comment: (NOTE) Calculated using the CKD-EPI Creatinine Equation (2021)    Anion gap 9 5 - 15    Comment: Performed at [facility], [address], [place], [place] [address]  Magnesium     Status: None   Collection Time: 12/19/22  3:25 AM  Result Value Ref Range   Magnesium 2.1 1.7 - 2.4 mg/dL    Comment: Performed at [facility], [address] [place], [place] [address]  Phosphorus     Status: Abnormal  Collection Time: 12/19/22  3:25 AM  Result Value Ref Range   Phosphorus 1.6 (L) 2.5 - 4.6 mg/dL    Comment: Performed at [facility], [address], [place], [place] [address]  Hepatic function panel     Status: Abnormal   Collection Time: 12/19/22  3:25 AM  Result Value Ref Range   Total Protein 5.2 (L) 6.5 - 8.1 g/dL   Albumin 1.6 (L) 3.5 - 5.0 g/dL   AST 42 (H) 15 - 41 U/L   ALT 26 0 - 44 U/L   Alkaline Phosphatase 63 38 - 126 U/L   Total Bilirubin 0.8 0.3 - 1.2 mg/dL   Bilirubin, Direct 0.3 (H) 0.0 - 0.2 mg/dL   Indirect Bilirubin 0.5 0.3 - 0.9 mg/dL    Comment: Performed at [facility], [address] [place], [place] [address]  Troponin I (High Sensitivity)     Status: Abnormal   Collection Time: 12/19/22  3:25 AM  Result Value Ref Range   Troponin I (High Sensitivity) 48 (H) <18 ng/L    Comment: (NOTE) Elevated high sensitivity troponin I (hsTnI) values and significant  changes across serial measurements may suggest ACS but many other  chronic and acute conditions are known to elevate hsTnI results.  Refer to the "Links" section for chest pain algorithms and  additional  guidance. Performed at [facility], [address], [place], [place] [address]   Lactic acid, plasma     Status: Abnormal   Collection Time: 12/19/22  3:25 AM  Result Value Ref Range   Lactic Acid, Venous 3.7 (HH) 0.5 - 1.9 mmol/L    Comment: CRITICAL VALUE NOTED. VALUE IS CONSISTENT WITH PREVIOUSLY REPORTED/CALLED VALUE Performed at [facility], [address] [place], [place] [address]   Heparin level (unfractionated)     Status: None   Collection Time: 12/19/22  9:49 AM  Result Value Ref Range   Heparin Unfractionated 0.36 0.30 - 0.70 IU/mL    Comment: (NOTE) The clinical reportable range upper limit is being lowered to >1.10 to align with the FDA approved guidance for the current laboratory assay.  If heparin results are below expected values, and patient dosage has  been confirmed, suggest follow up testing of antithrombin III levels. Performed at [facility], [address], [place], [place] [address]     CT FEMUR RIGHT WO CONTRAST  Result Date: 12/19/2022 CLINICAL DATA:  Right leg infection. Evaluate for necrotizing fasciitis. EXAM: CT OF THE LOWER RIGHT EXTREMITY WITHOUT CONTRAST TECHNIQUE: Multidetector CT imaging of the right thigh and lower leg was performed according to the standard protocol. RADIATION DOSE REDUCTION: This exam was performed according to the departmental dose-optimization program which includes automated exposure control, adjustment of the mA and/or kV according to patient size and/or use of iterative reconstruction technique. COMPARISON:  None Available. FINDINGS: Bones/Joint/Cartilage Prior right hip arthroplasty. 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 The distal aspect of the fracture remains nonunited. No new fracture or dislocation. Mild periosteal reaction along the lateral aspect of the mid to distal  fibular diaphysis (series 7, image 203). Degenerative changes of the right knee. Symmetric trace to small bilateral knee joint effusions. Ligaments Ligaments are suboptimally evaluated by CT. Muscles and Tendons Grossly intact. Soft tissue Right thigh and lower leg soft tissue swelling. 6.6 cm soft tissue ulcer along the lateral aspect of the mid to distal lower leg  with a few tiny foci of subcutaneous emphysema. The ulcer extends close to the outer cortex of the fibular shaft. No fluid collection or hematoma. No soft tissue mass. Please see same day CT abdomen pelvis report for intrapelvic findings. IMPRESSION: 1. 6.6 cm soft tissue ulcer along the lateral aspect of the mid to distal lower leg extending close to the underlying fibular diaphysis, with mild fibular periosteal reaction concerning for osteomyelitis. 2. No evidence of necrotizing fasciitis.  No definite abscess. 3. 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 The distal aspect of the fracture remains nonunited. Electronically Signed   By: Obie Dredge M.D.   On: 12/19/2022 12:13   CT TIBIA FIBULA RIGHT WO CONTRAST  Result Date: 12/19/2022 CLINICAL DATA:  Right leg infection. Evaluate for necrotizing fasciitis. EXAM: CT OF THE LOWER RIGHT EXTREMITY WITHOUT CONTRAST TECHNIQUE: Multidetector CT imaging of the right thigh and lower leg was performed according to the standard protocol. RADIATION DOSE REDUCTION: This exam was performed according to the departmental dose-optimization program which includes automated exposure control, adjustment of the mA and/or kV according to patient size and/or use of iterative reconstruction technique. COMPARISON:  None Available. FINDINGS: Bones/Joint/Cartilage Prior right hip arthroplasty. 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  The distal aspect of the fracture remains nonunited. No new fracture or dislocation. Mild periosteal reaction along the lateral aspect of the mid to distal fibular diaphysis (series 7, image 203). Degenerative changes of the right knee. Symmetric trace to small bilateral knee joint effusions. Ligaments Ligaments are suboptimally evaluated by CT. Muscles and Tendons Grossly intact. Soft tissue Right thigh and lower leg soft tissue swelling. 6.6 cm soft tissue ulcer along the lateral aspect of the mid to distal lower leg with a few tiny foci of subcutaneous emphysema. The ulcer extends close to the outer cortex of the fibular shaft. No fluid collection or hematoma. No soft tissue mass. Please see same day CT abdomen pelvis report for intrapelvic findings. IMPRESSION: 1. 6.6 cm soft tissue ulcer along the lateral aspect of the mid to distal lower leg extending close to the underlying fibular diaphysis, with mild fibular periosteal reaction concerning for osteomyelitis. 2. No evidence of necrotizing fasciitis.  No definite abscess. 3. 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 The distal aspect of the fracture remains nonunited. Electronically Signed   By: Obie Dredge M.D.   On: 12/19/2022 12:13   CT ABDOMEN PELVIS WO CONTRAST  Result Date: 12/19/2022 CLINICAL DATA:  decubitus ulcer, osteomyelitis EXAM: CT ABDOMEN AND PELVIS WITHOUT CONTRAST TECHNIQUE: Multidetector CT imaging of the abdomen and pelvis was performed following the standard protocol without IV contrast. RADIATION DOSE REDUCTION: This exam was performed according to the departmental dose-optimization program which includes automated exposure control, adjustment of the mA and/or kV according to patient size and/or use of iterative reconstruction technique. COMPARISON:  CT chest 12/18/2022. FINDINGS: Lower chest: Trace left pleural effusion  with left greater than right bibasilar airspace opacities, slightly progressed. Small-moderate pericardial effusion, unchanged. Hepatobiliary: Two cysts in the right hepatic lobe, largest measuring up to 3.0 cm. Otherwise unremarkable unenhanced appearance of the liver. Layering hyperdense material within the gallbladder is likely related to vicarious contrast excretion. Pancreas: Unremarkable. No pancreatic ductal dilatation or surrounding inflammatory changes. Spleen: Normal in size without focal abnormality. Adrenals/Urinary Tract:  Unremarkable adrenal glands. Bilateral renal cysts, which do not require follow-up imaging. There is contrast excretion within the bilateral renal collecting systems and urinary bladder. No hydronephrosis. Stomach/Bowel: Stomach within normal limits. No dilated loops of bowel to suggest obstruction. Extensive diverticulosis. No focal bowel wall thickening is evident. A rectal tube is in place. Vascular/Lymphatic: Scattered aortoiliac atherosclerotic calcifications without aneurysm. No abdominopelvic lymphadenopathy. Reproductive: Prostate is unremarkable. Other: No free air or free fluid.  Mild mesenteric edema. Musculoskeletal: Sacral decubitus ulcer overlying the distal sacrum and coccyx. Loss of cortical definition of the posterior cortex of the distal most sacral segment suspicious for osteomyelitis (series 2, image 69). There is soft tissue edema and numerous foci of air within the soft tissues adjacent to the ulceration site. No organized or drainable fluid collections. Ankylosis of the imaged thoracolumbar spine. Large anterior endplate osteophyte at L3-L4. Both SI joints are fused. No acute fractures are identified. Prior right total hip arthroplasty with proximal cerclage wires. IMPRESSION: 1. Sacral decubitus ulcer overlying the distal sacrum and coccyx. Loss of cortical definition of the posterior cortex of the distal-most sacral segment suggestive of osteomyelitis. There is  soft tissue edema and numerous foci of air within the soft tissues adjacent to the ulceration site. No organized or drainable fluid collections. 2. Trace left pleural effusion with left greater than right bibasilar airspace opacities, slightly progressed. 3. Small-moderate pericardial effusion, unchanged. 4. Extensive diverticulosis without evidence of acute diverticulitis. 5. 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 6. Aortic atherosclerosis (ICD10-I70.0). Electronically Signed   By: Duanne Guess D.O.   On: 12/19/2022 12:05   ECHOCARDIOGRAM COMPLETE  Result Date: 12/19/2022    ECHOCARDIOGRAM REPORT   Patient Name:   KEVIAN BURROLA Date of Exam: 12/19/2022 Medical Rec #:  [id number]       Height:       71.0 in Accession #:    [id number]      Weight:       129.4 lb Date of Birth:  Oct 06, 1933       BSA:          1.752 m Patient Age:    88 years        BP:           111/60 mmHg Patient Gender: M               HR:           121 bpm. Exam Location:  Inpatient Procedure: 2D Echo, Cardiac Doppler and Color Doppler Indications:    Pulmonary Embolus  History:        Patient has no prior history of Echocardiogram examinations.                 Arrythmias:Tachycardia, Signs/Symptoms:cough; Risk                 Factors:sepsis, LE ulcers.  Sonographer:    Wallie Char Referring Phys: 40 ROBERT S BYRUM  Sonographer Comments: Technically challenging study due to limited acoustic windows. IMPRESSIONS  1. Left ventricular ejection fraction, by estimation, is 55 to 60%. The left ventricle has normal function. The left ventricle has no regional wall motion abnormalities. Left ventricular diastolic parameters are consistent with Grade I diastolic dysfunction (impaired relaxation).  2. Right ventricular systolic function is normal. The right ventricular size is normal. Tricuspid regurgitation signal is inadequate for assessing PA pressure.  3. Moderate pericardial effusion. The pericardial effusion is  anterior to the right ventricle.  4. The  mitral valve is normal in structure. No evidence of mitral valve regurgitation. No evidence of mitral stenosis.  5. The aortic valve is tricuspid. There is mild calcification of the aortic valve. Aortic valve regurgitation is not visualized. Aortic valve sclerosis/calcification is present, without any evidence of aortic stenosis.  6. The inferior vena cava is normal in size with greater than 50% respiratory variability, suggesting right atrial pressure of 3 mmHg. Conclusion(s)/Recommendation(s): There is a moderate-sized anterior pericardial effusion without evidence of overt tamponade. there is no RV strain. FINDINGS  Left Ventricle: Left ventricular ejection fraction, by estimation, is 55 to 60%. The left ventricle has normal function. The left ventricle has no regional wall motion abnormalities. The left ventricular internal cavity size was normal in size. There is  no left ventricular hypertrophy. Left ventricular diastolic parameters are consistent with Grade I diastolic dysfunction (impaired relaxation). Right Ventricle: The right ventricular size is normal. No increase in right ventricular wall thickness. Right ventricular systolic function is normal. Tricuspid regurgitation signal is inadequate for assessing PA pressure. Left Atrium: Left atrial size was normal in size. Right Atrium: Right atrial size was normal in size. Pericardium: A moderately sized pericardial effusion is present. The pericardial effusion is anterior to the right ventricle. Mitral Valve: The mitral valve is normal in structure. No evidence of mitral valve regurgitation. No evidence of mitral valve stenosis. MV peak gradient, 1.9 mmHg. The mean mitral valve gradient is 1.0 mmHg. Tricuspid Valve: The tricuspid valve is normal in structure. Tricuspid valve regurgitation is trivial. No evidence of tricuspid stenosis. Aortic Valve: The aortic valve is tricuspid. There is mild calcification of the  aortic valve. Aortic valve regurgitation is not visualized. Aortic valve sclerosis/calcification is present, without any evidence of aortic stenosis. Aortic valve mean gradient measures 2.0 mmHg. Aortic valve peak gradient measures 2.4 mmHg. Aortic valve area, by VTI measures 2.81 cm. Pulmonic Valve: The pulmonic valve was normal in structure. Pulmonic valve regurgitation is not visualized. No evidence of pulmonic stenosis. Aorta: The aortic root is normal in size and structure. Venous: The inferior vena cava is normal in size with greater than 50% respiratory variability, suggesting right atrial pressure of 3 mmHg. IAS/Shunts: No atrial level shunt detected by color flow Doppler.  LEFT VENTRICLE PLAX 2D LVIDd:         4.60 cm     Diastology LVIDs:         3.40 cm     LV e' medial:    6.73 cm/s LV PW:         1.00 cm     LV E/e' medial:  9.6 LV IVS:        0.70 cm     LV e' lateral:   7.50 cm/s LVOT diam:     2.30 cm     LV E/e' lateral: 8.6 LV SV:         50 LV SV Index:   28 LVOT Area:     4.15 cm  LV Volumes (MOD) LV vol d, MOD A2C: 45.0 ml LV vol d, MOD A4C: 58.9 ml LV vol s, MOD A2C: 20.3 ml LV vol s, MOD A4C: 27.6 ml LV SV MOD A2C:     24.7 ml LV SV MOD A4C:     58.9 ml LV SV MOD BP:      29.1 ml RIGHT VENTRICLE             IVC RV Basal diam:  3.80 cm  IVC diam: 1.60 cm RV S prime:     26.10 cm/s TAPSE (M-mode): 2.0 cm LEFT ATRIUM           Index        RIGHT ATRIUM           Index LA diam:      3.20 cm 1.83 cm/m   RA Area:     14.70 cm LA Vol (A4C): 20.7 ml 11.81 ml/m  RA Volume:   36.40 ml  20.77 ml/m  AORTIC VALVE AV Area (Vmax):    2.83 cm AV Area (Vmean):   2.48 cm AV Area (VTI):     2.81 cm AV Vmax:           78.00 cm/s AV Vmean:          58.600 cm/s AV VTI:            0.177 m AV Peak Grad:      2.4 mmHg AV Mean Grad:      2.0 mmHg LVOT Vmax:         53.20 cm/s LVOT Vmean:        35.000 cm/s LVOT VTI:          0.120 m LVOT/AV VTI ratio: 0.68  AORTA Ao Root diam: 3.60 cm MITRAL VALVE                TRICUSPID VALVE MV Area (PHT): 3.39 cm    TR Peak grad:   27.0 mmHg MV Area VTI:   3.30 cm    TR Vmax:        260.00 cm/s MV Peak grad:  1.9 mmHg MV Mean grad:  1.0 mmHg    SHUNTS MV Vmax:       0.68 m/s    Systemic VTI:  0.12 m MV Vmean:      44.6 cm/s   Systemic Diam: 2.30 cm MV Decel Time: 224 msec MV E velocity: 64.60 cm/s MV A velocity: 66.80 cm/s MV E/A ratio:  0.97 Arvilla Meres MD Electronically signed by Arvilla Meres MD Signature Date/Time: 12/19/2022/11:29:18 AM    Final    CT Angio Chest PE W and/or Wo Contrast  Result Date: 12/18/2022 CLINICAL DATA:  Pulmonary embolism suspected. High probability. Shortness of breath and cough. EXAM: CT ANGIOGRAPHY CHEST WITH CONTRAST TECHNIQUE: Multidetector CT imaging of the chest was performed using the standard protocol during bolus administration of intravenous contrast. Multiplanar CT image reconstructions and MIPs were obtained to evaluate the vascular anatomy. RADIATION DOSE REDUCTION: This exam was performed according to the departmental dose-optimization program which includes automated exposure control, adjustment of the mA and/or kV according to patient size and/or use of iterative reconstruction technique. CONTRAST:  75mL OMNIPAQUE IOHEXOL 350 MG/ML SOLN COMPARISON:  Portable chest today portable chest 11/29/2022. No prior cross-sectional imaging for comparison. FINDINGS: Cardiovascular: The pulmonary trunk is prominent measuring 3.3 cm, and there is also elevated RV/LV ratio of 1.2 consistent with right heart dysfunction/strain. There are no central arterial clots, no large emboli. There is a small thrombus in a posterior basal right lower lobe segmental artery on 4: 97-100. On the left there are subsegmental lingular emboli on 4: 83-84 and 4:94-100. In the left lower lobe there is a lateral basal subsegmental embolus on 4: 100-104 with extension into the downstream small arteries in this distribution. No further emboli are seen. The heart  is mildly enlarged. Pericardial effusion noted circumferentially up to maximum 2 cm in thickness to the right.  The pulmonary veins are normal caliber. The great vessels are clear and branch normally. There is mild aortic atherosclerosis and tortuosity, borderline dilatation of the aortic root at the sinuses of Valsalva measuring 3.9 cm, and a slightly dilated ascending aorta measuring 4.2 cm. There is no aortic or great vessel dissection or stenosis. There are no visible coronary calcific plaques. There are small air pockets in both internal jugular veins, most likely either injected at the time of contrast injection or with placement of the patient's IV. There is no central air embolus. Mediastinum/Nodes: There is a heterogeneous 1 cm nodule in the left lobe of the thyroid gland.No follow-up imaging is recommended. Reference: J Am Coll Radiol. 2015 Feb;12(2): 143-50. There is no supraclavicular, axillary or intrathoracic adenopathy. Thoracic esophagus, both main bronchi and thoracic trachea are unremarkable. Lungs/Pleura: Minimal centrilobular and paraseptal emphysematous change both lung apices. Diffuse bronchial thickening. Scattered subsegmental bronchial impactions are noted in the posterior basal left lower lobe with patchy consolidation which is most likely due to bronchopneumonia although component of infarct is possible. There is mild posterior atelectasis in the right lower lobe. No bronchial impactions are seen on the right. The lungs are otherwise clear. There is a small posteromedial right diaphragmatic fat herniation into the lower chest. There are trace pleural effusions. No pneumothorax or pleural thickening. Upper Abdomen: No acute radiographic findings. Streak artifact limits fine detail due to the patient's arms in the field. There is a 3.2 cm simple appearing cyst in the superior pole of the right kidney with a Hounsfield density of 11.4. No follow-up imaging is recommended. In hepatic segment 5  there is an indeterminate hypodense lesion measuring 3.2 cm with appearance of vessels at its periphery, possibly a hemangioma but further evaluation is recommended. Musculoskeletal: There is multilevel bridging enthesopathy of the thoracic spine. No spinal compression fracture or other acute or significant osseous findings. The ribcage is intact. No chest wall mass. Review of the MIP images confirms the above findings. IMPRESSION: 1. Positive for acute PE with CT findings of right heart strain (RV/LV Ratio = 1.2 ) consistent with at least submassive (intermediate risk) PE, although the visible clot burden is small. The presence of right heart strain has been associated with an increased risk of morbidity and mortality. Please refer to the "Code PE Focused" order set in EPIC. 2. Cardiomegaly with pericardial effusion. 3. Aortic atherosclerosis with borderline dilatation of the aortic root at the sinuses of Valsalva, and slightly dilated ascending aorta at 4.2 cm. No dissection or stenosis. Recommend annual imaging followup by CTA or MRA. This recommendation follows 2010 ACCF/AHA/AATS/ACR/ASA/SCA/SCAI/SIR/STS/SVM Guidelines for the Diagnosis and Management of Patients with Thoracic Aortic Disease. Circulation. 2010; 121: Z610-R604. Aortic aneurysm NOS (ICD10-I71.9) 4. Bronchitis with scattered subsegmental bronchial impactions or aspiration in the posterior basal left lower lobe, with patchy consolidation which is most likely due to bronchopneumonia although component of infarct is possible. Follow-up study recommended after treatment to ensure clearing. 5. Trace pleural effusions. 6. Indeterminate 3.2 cm hypodense lesion in hepatic segment 5. Follow-up nonemergent MRI or CT without and with contrast recommended. 7. Small air pockets in both internal jugular veins, most likely either injected at the time of contrast injection or with placement of the patient's IV. 8. Emphysema. 9. Critical Value/emergent results were  called by telephone at the time of interpretation on 12/18/2022 at 10:23 pm to provider [facility] , who verbally acknowledged these results. Aortic Atherosclerosis (ICD10-I70.0) and Emphysema (ICD10-J43.9). Electronically Signed   By: Mellody Dance  Chesser M.D.   On: 12/18/2022 22:32   DG Chest Port 1 View  Result Date: 12/18/2022 CLINICAL DATA:  Shortness of breath and cough EXAM: PORTABLE CHEST 1 VIEW COMPARISON:  Radiograph 11/29/2022 FINDINGS: Stable cardiomediastinal silhouette. No focal consolidation, pleural effusion, or pneumothorax. No displaced rib fractures. Advanced arthritis both shoulders. IMPRESSION: No active disease. Electronically Signed   By: Minerva Fester M.D.   On: 12/18/2022 18:02    Review of Systems  Unable to perform ROS: Dementia   Blood pressure 111/60, pulse (!) 26, temperature (!) 97.5 F (36.4 C), temperature source Axillary, resp. rate (!) 27, height 5\' 11"  (1.803 m), weight 58.7 kg, SpO2 98%. Physical Exam Skin:         Comments: 10 cm x 5 cm region of her sacrum unstageable sacral decubitus.  Skin is intact.  He is leaking stool from his Flexi-Seal.  No crepitance or signs of necrotizing infection noted.   Right extremity wound measures 10 x 4 cm.  Necrotic down to the muscle.  No exposed bone but unstageable.  According was family this is gotten better with local wound care.     Assessment/Plan: Stage IV sacral decubiti  Right lower extremity chronic wound  No signs of necrotizing infection least by examination today.  He is extremely debilitated.  Recommend wound ostomy care nurse evaluation for wound care.  General surgery will see on Monday since there is no acute need for any surgical intervention over the weekend.   Moderate complexity  Tayla Panozzo A Nastasha Reising 12/19/2022, 12:41 PM

## 2022-12-19 NOTE — Progress Notes (Signed)
[facility] ADULT ICU REPLACEMENT PROTOCOL   The patient does apply for the [facility] Adult ICU Electrolyte Replacment Protocol based on the criteria listed below:   1.Exclusion criteria: TCTS, ECMO, Dialysis, and Myasthenia Gravis patients 2. Is GFR >/= 30 ml/min? Yes.    Patient's GFR today is >60 3. Is SCr </= 2? Yes.   Patient's SCr is 0.61 mg/dL 4. Did SCr increase >/= 0.5 in 24 hours? No. 5.Pt's weight >40kg  Yes.   6. Abnormal electrolyte(s): phos 1.6, potassium 3.0  7. Electrolytes replaced per protocol 8.  Call MD STAT for K+ </= 2.5, Phos </= 1, or Mag </= 1 Physician:  protocol  Melvern Banker 12/19/2022 4:31 AM

## 2022-12-19 NOTE — Progress Notes (Signed)
PHARMACY - PHYSICIAN COMMUNICATION CRITICAL VALUE ALERT - BLOOD CULTURE IDENTIFICATION (BCID)  Edwin [name] is an 89 y.o. male who presented to [facility] on 12/18/2022 with a chief complaint of pneumonia, decubitus ulcers.  Assessment:  4/4 BCx bottles growing E coli (no common ESBL/carbapenemase genes detected); 3/4 bottles growing E faecalis; suspect wound as source  Name of physician (or Provider) Contacted: Edwin [name]  Current antibiotics: Zosyn  Changes to prescribed antibiotics recommended:  Patient is on recommended antibiotics - No changes needed  Results for orders placed or performed during the hospital encounter of 12/18/22  Blood Culture ID Panel (Reflexed) (Collected: 12/18/2022  6:18 PM)  Result Value Ref Range   Enterococcus faecalis DETECTED (A) NOT DETECTED   Enterococcus Faecium NOT DETECTED NOT DETECTED   Listeria monocytogenes NOT DETECTED NOT DETECTED   Staphylococcus species NOT DETECTED NOT DETECTED   Staphylococcus aureus (BCID) NOT DETECTED NOT DETECTED   Staphylococcus epidermidis NOT DETECTED NOT DETECTED   Staphylococcus lugdunensis NOT DETECTED NOT DETECTED   Streptococcus species NOT DETECTED NOT DETECTED   Streptococcus agalactiae NOT DETECTED NOT DETECTED   Streptococcus pneumoniae NOT DETECTED NOT DETECTED   Streptococcus pyogenes NOT DETECTED NOT DETECTED   A.calcoaceticus-baumannii NOT DETECTED NOT DETECTED   Bacteroides fragilis NOT DETECTED NOT DETECTED   Enterobacterales DETECTED (A) NOT DETECTED   Enterobacter cloacae complex NOT DETECTED NOT DETECTED   Escherichia coli DETECTED (A) NOT DETECTED   Klebsiella aerogenes NOT DETECTED NOT DETECTED   Klebsiella oxytoca NOT DETECTED NOT DETECTED   Klebsiella pneumoniae NOT DETECTED NOT DETECTED   Proteus species NOT DETECTED NOT DETECTED   Salmonella species NOT DETECTED NOT DETECTED   Serratia marcescens NOT DETECTED NOT DETECTED   Haemophilus influenzae NOT DETECTED NOT DETECTED   Neisseria  meningitidis NOT DETECTED NOT DETECTED   Pseudomonas aeruginosa NOT DETECTED NOT DETECTED   Stenotrophomonas maltophilia NOT DETECTED NOT DETECTED   Candida albicans NOT DETECTED NOT DETECTED   Candida auris NOT DETECTED NOT DETECTED   Candida glabrata NOT DETECTED NOT DETECTED   Candida krusei NOT DETECTED NOT DETECTED   Candida parapsilosis NOT DETECTED NOT DETECTED   Candida tropicalis NOT DETECTED NOT DETECTED   Cryptococcus neoformans/gattii NOT DETECTED NOT DETECTED   CTX-M ESBL NOT DETECTED NOT DETECTED   Carbapenem resistance IMP NOT DETECTED NOT DETECTED   Carbapenem resistance KPC NOT DETECTED NOT DETECTED   Carbapenem resistance NDM NOT DETECTED NOT DETECTED   Carbapenem resist OXA 48 LIKE NOT DETECTED NOT DETECTED   Vancomycin resistance NOT DETECTED NOT DETECTED   Carbapenem resistance VIM NOT DETECTED NOT DETECTED    Edwin [name] A 12/19/2022  12:45 PM

## 2022-12-19 NOTE — Progress Notes (Signed)
An USGPIV (ultrasound guided PIV) has been placed for short-term vasopressor infusion. A correctly placed ivWatch must be used when administering vasopressors. Should this treatment be needed beyond 72 hours, central line access should be obtained.  It will be the responsibility of the bedside nurse to follow best practice to prevent extravasations.   

## 2022-12-19 NOTE — Progress Notes (Addendum)
NAME:  Edwin [name], MRN:  [id number], DOB:  1934/02/07, LOS: 1 ADMISSION DATE:  12/18/2022, CONSULTATION DATE: 12/18/2022 REFERRING MD: Dr. Lynelle Doctor, CHIEF COMPLAINT: Pneumonia, pulmonary embolism  History of Present Illness:  89 year old man with history of dementia, right hip fracture/hemiarthroplasty 05/2022, bedridden and nonverbal at baseline.  Family is able to assist him into a wheelchair intermittently and he has home health assistance.  He has reportedly had ongoing cough for a few weeks.  There was concern that he was worsening and EMS was called.  In the emergency department he has been relatively hypotensive and tachycardic.  Noted to have a large sacral decubitus ulcer, right lower extremity decubitus ulcer with significant breakdown and malodorous drainage.  CT-PA chest identified scattered subsegmental bronchial impaction posterior left lower lobe, to a lesser degree posterior right lower lobe as well as small bilateral segmental and subsegmental pulmonary embolism.  RV/LV ratio 1.2 suggesting right heart strain.  He has received vancomycin and cefepime, started on heparin infusion, volume resuscitation.  Lactic acid slightly elevated 3.5, troponin 59, WBC 3.9 with a left shift.   Pertinent  Medical History   Past Medical History:  Diagnosis Date   Dementia (HCC)     Significant Hospital Events: Including procedures, antibiotic start and stop dates in addition to other pertinent events   CT-PA 7/12 >>   Interim History / Subjective:   Improving critically ill, on low-dose Levophed Low-grade febrile 100 Urine output about 450 cc  Objective   Blood pressure 116/85, pulse (!) 26, temperature (!) 97.5 F (36.4 C), temperature source Axillary, resp. rate 16, height 5\' 11"  (1.803 m), weight 58.7 kg, SpO2 98%.        Intake/Output Summary (Last 24 hours) at 12/19/2022 0911 Last data filed at 12/19/2022 6010 Gross per 24 hour  Intake 3482.83 ml  Output 450 ml  Net 3032.83 ml    Filed Weights   12/18/22 1840 12/19/22 0115  Weight: 65.8 kg 58.7 kg    Examination: General: Cachectic chronically ill-appearing man laying in bed, no distress HENT: Oropharynx dry, no stridor.  Pupils equal Lungs: Decreased to both bases, no crackles or wheezes Cardiovascular: S1-S2 regular, distant, no murmur Abdomen: Nondistended, hypoactive bowel sounds heard Extremities: Decubitus ulcer right lower calf, necrotic with discharge Skin: Right lower extremity ulcer as above, large stage IV sacral decubitus ulcer with malodorous discharge, overhanging edges with tunneling up to 4 cm Neuro: Awake, eyes open, but nonverbal and does not follow commands able to spontaneously move bilateral upper extremities, appears purposeful  Labs show hypokalemia, hypophosphatemia, albumin 1.6, leukocytosis, lactate steady at 3.7  Resolved Hospital Problem list     Assessment & Plan:   Septic shock, likely source sacral and right lower extremity decubitus ulcers, also left lower lobe pneumonia -Continue volume resuscitation, taper Levophed to off -Follow lactic acid trend -DC cefepime and use Zosyn -DC vancomycin since MRSA PCR negative  -Await CT pelvis and right lower extremity to ensure no evidence of gas formation, necrotizing fasciitis.   Pulmonary emboli with evidence for RV strain Pericardial effusion moderate -heparin infusion -Await echocardiogram for RV function and rule out tamponade   Hypokalemia and hypophosphatemia will be repleted  Dementia -Supportive care -Avoid sedating medications  At risk malnutrition -N.p.o. for now until mental status improves for swallowing evaluation  Sacral decubitus and leg wound POA --Wound care consultation and general surgery eval requested -Picture placed in media tab  Best Practice (right click and "Reselect all SmartList Selections" daily)  Diet/type: NPO DVT prophylaxis: systemic heparin GI prophylaxis: PPI Lines: N/A Foley:   N/A Code Status:  full code Last date of multidisciplinary goals of care discussion [discussed goals for care with the patient's wife by phone 7/13.  Explained that in his debilitated condition that he likely would not survive mechanical ventilation or CPR.  At this time he is wife wants him to receive all aggressive care including ACLS, MV.   Full code at this time.]  Labs   CBC: Recent Labs  Lab 12/18/22 1807 12/19/22 0325  WBC 9.1 24.0*  NEUTROABS 8.4*  --   HGB 9.4* 8.2*  HCT 29.2* 25.5*  MCV 74.9* 76.8*  PLT 153 129*    Basic Metabolic Panel: Recent Labs  Lab 12/18/22 1807 12/19/22 0325  NA 137 139  K 3.1* 3.0*  CL 104 108  CO2 24 22  GLUCOSE 111* 98  BUN 11 11  CREATININE 0.81 0.61  CALCIUM 9.5 8.9  MG  --  2.1  PHOS  --  1.6*   GFR: Estimated Creatinine Clearance: 55 mL/min (by C-G formula based on SCr of 0.61 mg/dL). Recent Labs  Lab 12/18/22 1807 12/18/22 2125 12/18/22 2358 12/19/22 0325  WBC 9.1  --   --  24.0*  LATICACIDVEN  --  3.5* 4.4* 3.7*    Liver Function Tests: Recent Labs  Lab 12/18/22 1807 12/19/22 0325  AST 43* 42*  ALT 28 26  ALKPHOS 73 63  BILITOT 1.0 0.8  PROT 7.2 5.2*  ALBUMIN 2.2* 1.6*   No results for input(s): "LIPASE", "AMYLASE" in the last 168 hours. No results for input(s): "AMMONIA" in the last 168 hours.  ABG No results found for: "PHART", "PCO2ART", "PO2ART", "HCO3", "TCO2", "ACIDBASEDEF", "O2SAT"   Coagulation Profile: Recent Labs  Lab 12/18/22 1807  INR 1.3*    Cardiac Enzymes: No results for input(s): "CKTOTAL", "CKMB", "CKMBINDEX", "TROPONINI" in the last 168 hours.  HbA1C: No results found for: "HGBA1C"  CBG: No results for input(s): "GLUCAP" in the last 168 hours.   Critical care time: 34 minutes     Cyril Mourning MD. FCCP. [facility] Pager : [phone]  If no response to pager , please call [phone] until 7 pm After 7:00 pm call Elink  [phone]    12/19/2022,  9:11 AM

## 2022-12-19 NOTE — Plan of Care (Signed)
  Problem: Clinical Measurements: Goal: Will remain free from infection Outcome: Progressing Goal: Diagnostic test results will improve Outcome: Progressing Goal: Respiratory complications will improve Outcome: Progressing Goal: Cardiovascular complication will be avoided Outcome: Progressing   

## 2022-12-19 NOTE — Progress Notes (Signed)
ANTICOAGULATION CONSULT NOTE - Follow Up Consult  Pharmacy Consult for Heparin Indication: pulmonary embolus  Allergies  Allergen Reactions   Milk-Related Compounds Other (See Comments)    NO DAIRY, NO YOGURT, NO ICE CREAM, NO CHEESE, NO MILK (PREFERENCE)   Beef-Derived Products Other (See Comments)    Requested by wife.   Egg-Derived Products Other (See Comments)    Patient wife states they are vegetarian, eggs are not apart of their diet.    Other Other (See Comments)    Unnamed antibiotic- "Made him sick"- no further elaboration  Patient prefers to not take medication, in general    Patient Measurements: Height: 5\' 11"  (180.3 cm) Weight: 58.7 kg (129 lb 6.6 oz) IBW/kg (Calculated) : 75.3 Heparin Dosing Weight: 58.7 kg  Vital Signs: Temp: 98.3 F (36.8 C) (07/13 1659) Temp Source: Oral (07/13 1659) BP: 185/136 (07/13 1600) Pulse Rate: 100 (07/13 1900)  Labs: Recent Labs    12/18/22 1807 12/18/22 2125 12/19/22 0325 12/19/22 0949 12/19/22 1849  HGB 9.4*  --  8.2*  --   --   HCT 29.2*  --  25.5*  --   --   PLT 153  --  129*  --   --   APTT 28  --   --   --   --   LABPROT 16.7*  --   --   --   --   INR 1.3*  --   --   --   --   HEPARINUNFRC  --   --   --  0.36 0.23*  CREATININE 0.81  --  0.61  --   --   TROPONINIHS  --  59* 48*  --   --     Estimated Creatinine Clearance: 55 mL/min (by C-G formula based on SCr of 0.61 mg/dL).   Assessment: AC/Heme: PE on IV heparin, Hgb 9.4>8.2, Plts only 129. Heparin level 0.36 now down to 0.23.  Goal of Therapy:  Heparin level 0.3-0.7 units/ml Monitor platelets by anticoagulation protocol: Yes   Plan:  Increase IV heparin to 1250 units/hr Recheck in 6 hrs Daily HL and CBC  Doloris Servantes S. Merilynn Finland, PharmD, BCPS Clinical Staff Pharmacist [email or web address]  Misty Stanley Stillinger 12/19/2022,7:29 PM

## 2022-12-19 NOTE — Progress Notes (Addendum)
eLink Physician-Brief Progress Note Patient Name: Edwin [name] DOB: 1933/11/01 MRN: [id number]   Date of Service  12/19/2022  HPI/Events of Note  48M bedbound and nonverbal at baseline with dementia found with hypotension and tachycardia in the ED. Of not significant large sacral decubitus ulcer and RLE decubitus ulcer with drainage. CTA with small bilateral segmental and subsegmental PE and LLE pneumonia. Admitted to PCCM for sepsis. On Cefepime and Vanc.   No pressor requirement. BP on IVF resuscitation 99/53 (68). On room air protecting airway   eICU Interventions  IVF fluid resuscitation F/u CT pelvis and RLE to r/o nec fasc Heparin gtt for PE   2:15 AM SBP 70s on repeat. S/p 2L. Give additional 1L. If hypotensive after this will plan on low dose vasopressor support. RN will obtain US guided IV. RT for A-line.  3:20 AM BP 114/58 after 3rd bolus however started on low dose levophed for hypotension again    Suraiya Dickerson Mechele Collin 12/19/2022, 1:36 AM

## 2022-12-19 NOTE — Sepsis Progress Note (Signed)
Followed for sepsis monitoring 

## 2022-12-19 NOTE — Progress Notes (Signed)
Pharmacy Antibiotic Note  Edwin [name] is a 89 y.o. male admitted on 12/18/2022 with Septic shock, likely source sacral/RLE ulcers and pneumonia.  Pharmacy has been consulted for Zosyn dosing.  Plan: Zosyn 3.375g IV Q8H infused over 4hrs.  Follow up renal function, culture results, and clinical course.   Height: 5\' 11"  (180.3 cm) Weight: 58.7 kg (129 lb 6.6 oz) IBW/kg (Calculated) : 75.3  Temp (24hrs), Avg:98.1 F (36.7 C), Min:97.3 F (36.3 C), Max:100 F (37.8 C)  Recent Labs  Lab 12/18/22 1807 12/18/22 2125 12/18/22 2358 12/19/22 0325  WBC 9.1  --   --  24.0*  CREATININE 0.81  --   --  0.61  LATICACIDVEN  --  3.5* 4.4* 3.7*    Estimated Creatinine Clearance: 55 mL/min (by C-G formula based on SCr of 0.61 mg/dL).    Allergies  Allergen Reactions   Milk-Related Compounds Other (See Comments)    NO DAIRY, NO YOGURT, NO ICE CREAM, NO CHEESE, NO MILK (PREFERENCE)   Beef-Derived Products Other (See Comments)    Requested by wife.   Egg-Derived Products Other (See Comments)    Patient wife states they are vegetarian, eggs are not apart of their diet.    Other Other (See Comments)    Unnamed antibiotic- "Made him sick"- no further elaboration  Patient prefers to not take medication, in general    Antimicrobials this admission: 7/12 cefepime >> 7/13 7/13 vanc >> 7/13 7/13 Zosyn >>    Dose adjustments this admission:     Microbiology results: 7/12 BCx:  7/13 MRSA PCR: not detected Cdiff:    Thank you for allowing pharmacy to be a part of this patient's care.  Lynann Beaver PharmD, BCPS [facility] main pharmacy [phone] 12/19/2022 10:10 AM

## 2022-12-19 NOTE — Progress Notes (Addendum)
He has polymicrobial bacteremia with E faecalis and E. Coli Now off pressors. Appreciate ID input, antibiotic has been changed to Zosyn.  We will not pursue TEE here but will obtain transthoracic-he will need at least 6 weeks of treatment , given osteomyelitis Await wound care input, has been seen by general surgery [facility] - no tamponade, no RV strain  Overall poor prognosis -will involve palliative care TRH to pick up tomorrow a.m.  Comer Locket Vassie Loll MD

## 2022-12-19 NOTE — Consult Note (Addendum)
[facility]  Total days of antibiotics 2 piptazo  Reason for Consult: polymicrobial bacteremia (including enterococcus faecalis)    Referring Physician: alva  Principal Problem:   Sepsis (HCC) Active Problems:   Pulmonary embolism (HCC)    HPI: Erdi Bevil is a 89 y.o. 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 He was brought to the ED via EMS due to worsening cough x 2 wks in the setting of hypotension. He is previously known to have large sacral decub. Work up revealed pulmonary infilatrates but alos small bilateral PE. He was started on broad spectrum abtx including heparin and IVF to treat sepsis and PE. He had mildly elevate LA. And leukocytosis increased from 9 to 24K. Abdominal/ pelvis CT showing signs of sacral osteomyelitis. His exam is noteable for 10 x 5 cm unstageable sacral decub ulcer with necrotic base as well as right lower leg wound that is 10 x 4cm  that goes quite deep down tissue to bone. on imaging close to outer cortex of fibular shaft. General surgery evaluated for nec fascitiis. Infectious work up -- showed blood cx in + ecoli and e.faecalis.  history taken from chart/family.  Past Medical History:  Diagnosis Date   Dementia (HCC)     Allergies:  Allergies  Allergen Reactions   Milk-Related Compounds Other (See Comments)    NO DAIRY, NO YOGURT, NO ICE CREAM, NO CHEESE, NO MILK (PREFERENCE)   Beef-Derived Products Other (See Comments)    Requested by wife.   Egg-Derived Products Other (See Comments)    Patient wife states they are vegetarian, eggs are not apart of their diet.    Other Other (See Comments)    Unnamed antibiotic- "Made him sick"- no further elaboration  Patient prefers to not take medication, in general    MEDICATIONS:  Chlorhexidine Gluconate Cloth  6 each Topical Daily   mouth rinse  15 mL Mouth Rinse 4 times per day    Social History   Tobacco Use    Smoking status: Never   Smokeless tobacco: Never  Substance Use Topics   Alcohol use: Not Currently   Drug use: Not Currently    Family History  Problem Relation Age of Onset   Dementia Sister    Dementia Brother     Review of Systems -  Unable to obtain from patient due to dementia  OBJECTIVE: Temp:  [97.3 F (36.3 C)-100 F (37.8 C)] 97.4 F (36.3 C) (07/13 1230) Pulse Rate:  [25-113] 89 (07/13 1600) Resp:  [11-28] 22 (07/13 1600) BP: (72-185)/(45-136) 185/136 (07/13 1600) SpO2:  [96 %-100 %] 96 % (07/13 1600) Arterial Line BP: (105-156)/(50-85) 133/53 (07/13 1600) Weight:  [58.7 kg-65.8 kg] 58.7 kg (07/13 0115)  Skin =    LABS: Results for orders placed or performed during the hospital encounter of 12/18/22 (from the past 48 hour(s))  Comprehensive metabolic panel     Status: Abnormal   Collection Time: 12/18/22  6:07 PM  Result Value Ref Range   Sodium 137 135 - 145 mmol/L   Potassium 3.1 (L) 3.5 - 5.1 mmol/L   Chloride 104 98 - 111 mmol/L   CO2 24 22 - 32 mmol/L   Glucose, Bld 111 (H) 70 - 99 mg/dL    Comment: Glucose reference range applies only to samples taken after fasting for at least 8 hours.   BUN 11 8 - 23 mg/dL  Creatinine, Ser 0.81 0.61 - 1.24 mg/dL   Calcium 9.5 8.9 - 16.1 mg/dL   Total Protein 7.2 6.5 - 8.1 g/dL   Albumin 2.2 (L) 3.5 - 5.0 g/dL   AST 43 (H) 15 - 41 U/L   ALT 28 0 - 44 U/L   Alkaline Phosphatase 73 38 - 126 U/L   Total Bilirubin 1.0 0.3 - 1.2 mg/dL   GFR, Estimated >09 >60 mL/min    Comment: (NOTE) Calculated using the CKD-EPI Creatinine Equation (2021)    Anion gap 9 5 - 15    Comment: Performed at [facility], [address], [place], [place] [address]  CBC with Differential     Status: Abnormal   Collection Time: 12/18/22  6:07 PM  Result Value Ref Range   WBC 9.1 4.0 - 10.5 K/uL   RBC 3.90 (L) 4.22 - 5.81 MIL/uL   Hemoglobin 9.4 (L) 13.0 - 17.0 g/dL   HCT 81.1 (L) 91.4 - 78.2 %   MCV 74.9 (L)  80.0 - 100.0 fL   MCH 24.1 (L) 26.0 - 34.0 pg   MCHC 32.2 30.0 - 36.0 g/dL   RDW 95.6 (H) 21.3 - 08.6 %   Platelets 153 150 - 400 K/uL   nRBC 0.4 (H) 0.0 - 0.2 %   Neutrophils Relative % 92 %   Neutro Abs 8.4 (H) 1.7 - 7.7 K/uL   Lymphocytes Relative 6 %   Lymphs Abs 0.5 (L) 0.7 - 4.0 K/uL   Monocytes Relative 1 %   Monocytes Absolute 0.1 0.1 - 1.0 K/uL   Eosinophils Relative 0 %   Eosinophils Absolute 0.0 0.0 - 0.5 K/uL   Basophils Relative 0 %   Basophils Absolute 0.0 0.0 - 0.1 K/uL   Immature Granulocytes 1 %   Abs Immature Granulocytes 0.06 0.00 - 0.07 K/uL   Polychromasia PRESENT    Target Cells PRESENT     Comment: Performed at [facility], [address] [place], [place] [address]  Protime-INR     Status: Abnormal   Collection Time: 12/18/22  6:07 PM  Result Value Ref Range   Prothrombin Time 16.7 (H) 11.4 - 15.2 seconds   INR 1.3 (H) 0.8 - 1.2    Comment: (NOTE) INR goal varies based on device and disease states. Performed at [facility], [address] [place], [place] [address]   APTT     Status: None   Collection Time: 12/18/22  6:07 PM  Result Value Ref Range   aPTT 28 24 - 36 seconds    Comment: Performed at [facility], [address], [place], [place] [address]  Blood Culture (routine x 2)     Status: None (Preliminary result)   Collection Time: 12/18/22  6:18 PM   Specimen: BLOOD RIGHT WRIST  Result Value Ref Range   Specimen Description      BLOOD RIGHT WRIST Performed at [facility], [address], [place], [place] [address]    Special Requests      BOTTLES DRAWN AEROBIC AND ANAEROBIC Blood Culture adequate volume Performed at [facility], [address] [place], [place] [address]    Culture  Setup Time      Romie Minus NEGATIVE RODS GRAM POSITIVE COCCI IN BOTH AEROBIC AND ANAEROBIC BOTTLES CRITICAL RESULT CALLED TO, READ BACK BY AND VERIFIED WITH: Nathanial Millman  [facility] [id number] 1232 BY Berline Chough, MT Performed at [facility], [address] [place], [place] [address]    Culture GRAM NEGATIVE RODS GRAM POSITIVE COCCI     Report Status PENDING   Blood Culture (routine x 2)     Status: None (Preliminary result)   Collection Time: 12/18/22  6:18 PM   Specimen: BLOOD  Result Value Ref Range   Specimen Description      BLOOD RIGHT ANTECUBITAL Performed at [facility], [address], [place], [place] [address]    Special Requests      BOTTLES DRAWN AEROBIC AND ANAEROBIC Blood Culture adequate volume Performed at [facility], [address] [place], [place] [address]    Culture  Setup Time      GRAM NEGATIVE RODS ANAEROBIC BOTTLE ONLY GRAM POSITIVE COCCI AND GNR AEROBIC BOTTLE ONLY CRITICAL VALUE NOTED.  VALUE IS CONSISTENT WITH PREVIOUSLY REPORTED AND CALLED VALUE. Performed at [facility], [address], [place], [place] [address]    Culture GRAM NEGATIVE RODS GRAM POSITIVE COCCI     Report Status PENDING   Blood Culture ID Panel (Reflexed)     Status: Abnormal   Collection Time: 12/18/22  6:18 PM  Result Value Ref Range   Enterococcus faecalis DETECTED (A) NOT DETECTED    Comment: CRITICAL RESULT CALLED TO, READ BACK BY AND VERIFIED WITH: PHARMD DREW WOFFORD [id number] 1232 BY J RAZZAK, MT    Enterococcus Faecium NOT DETECTED NOT DETECTED   Listeria monocytogenes NOT DETECTED NOT DETECTED   Staphylococcus species NOT DETECTED NOT DETECTED   Staphylococcus aureus (BCID) NOT DETECTED NOT DETECTED   Staphylococcus epidermidis NOT DETECTED NOT DETECTED   Staphylococcus lugdunensis NOT DETECTED NOT DETECTED   Streptococcus species NOT DETECTED NOT DETECTED   Streptococcus agalactiae NOT DETECTED NOT DETECTED   Streptococcus pneumoniae NOT DETECTED NOT DETECTED   Streptococcus pyogenes NOT DETECTED NOT DETECTED   A.calcoaceticus-baumannii NOT DETECTED NOT DETECTED   Bacteroides  fragilis NOT DETECTED NOT DETECTED   Enterobacterales DETECTED (A) NOT DETECTED    Comment: Enterobacterales represent a large order of gram negative bacteria, not a single organism. CRITICAL RESULT CALLED TO, READ BACK BY AND VERIFIED WITH: PHARMD DREW WOFFORD [id number] 1232 BY J RAZZAK, MT    Enterobacter cloacae complex NOT DETECTED NOT DETECTED   Escherichia coli DETECTED (A) NOT DETECTED    Comment: CRITICAL RESULT CALLED TO, READ BACK BY AND VERIFIED WITH: PHARMD DREW WOFFORD [id number] 1232 BY J RAZZAK, MT    Klebsiella aerogenes NOT DETECTED NOT DETECTED   Klebsiella oxytoca NOT DETECTED NOT DETECTED   Klebsiella pneumoniae NOT DETECTED NOT DETECTED   Proteus species NOT DETECTED NOT DETECTED   Salmonella species NOT DETECTED NOT DETECTED   Serratia marcescens NOT DETECTED NOT DETECTED   Haemophilus influenzae NOT DETECTED NOT DETECTED   Neisseria meningitidis NOT DETECTED NOT DETECTED   Pseudomonas aeruginosa NOT DETECTED NOT DETECTED   Stenotrophomonas maltophilia NOT DETECTED NOT DETECTED   Candida albicans NOT DETECTED NOT DETECTED   Candida auris NOT DETECTED NOT DETECTED   Candida glabrata NOT DETECTED NOT DETECTED   Candida krusei NOT DETECTED NOT DETECTED   Candida parapsilosis NOT DETECTED NOT DETECTED   Candida tropicalis NOT DETECTED NOT DETECTED   Cryptococcus neoformans/gattii NOT DETECTED NOT DETECTED   CTX-M ESBL NOT DETECTED NOT DETECTED   Carbapenem resistance IMP NOT DETECTED NOT DETECTED   Carbapenem resistance KPC NOT DETECTED NOT DETECTED   Carbapenem resistance NDM NOT DETECTED NOT DETECTED   Carbapenem resist OXA 48 LIKE NOT  DETECTED NOT DETECTED   Vancomycin resistance NOT DETECTED NOT DETECTED   Carbapenem resistance VIM NOT DETECTED NOT DETECTED    Comment: Performed at [facility], [address], [place], [place] [address]  Troponin I (High Sensitivity)     Status: Abnormal   Collection Time: 12/18/22  9:25 PM  Result Value Ref Range    Troponin I (High Sensitivity) 59 (H) <18 ng/L    Comment: (NOTE) Elevated high sensitivity troponin I (hsTnI) values and significant  changes across serial measurements may suggest ACS but many other  chronic and acute conditions are known to elevate hsTnI results.  Refer to the "Links" section for chest pain algorithms and additional  guidance. Performed at [facility], [address], [place], [place] [address]   Lactic acid, plasma     Status: Abnormal   Collection Time: 12/18/22  9:25 PM  Result Value Ref Range   Lactic Acid, Venous 3.5 (HH) 0.5 - 1.9 mmol/L    Comment: CRITICAL RESULT CALLED TO, READ BACK BY AND VERIFIED WITH BLACKWELL S. RN @2226  MCLEAN K. Performed at [facility], [address] [place], [place] [address]   Lactic acid, plasma     Status: Abnormal   Collection Time: 12/18/22 11:58 PM  Result Value Ref Range   Lactic Acid, Venous 4.4 (HH) 0.5 - 1.9 mmol/L    Comment: CRITICAL VALUE NOTED. VALUE IS CONSISTENT WITH PREVIOUSLY REPORTED/CALLED VALUE Performed at [facility], [address], [place], [place] [address]   MRSA Next Gen by PCR, Nasal     Status: None   Collection Time: 12/19/22  1:02 AM   Specimen: Nasal Mucosa; Nasal Swab  Result Value Ref Range   MRSA by PCR Next Gen NOT DETECTED NOT DETECTED    Comment: (NOTE) The GeneXpert MRSA Assay (FDA approved for NASAL specimens only), is one component of a comprehensive MRSA colonization surveillance program. It is not intended to diagnose MRSA infection nor to guide or monitor treatment for MRSA infections. Test performance is not FDA approved in patients less than 79 years old. Performed at [facility], [address] [place], [place] [address]   CBC     Status: Abnormal   Collection Time: 12/19/22  3:25 AM  Result Value Ref Range   WBC 24.0 (H) 4.0 - 10.5 K/uL   RBC 3.32 (L) 4.22 - 5.81 MIL/uL   Hemoglobin 8.2 (L)  13.0 - 17.0 g/dL    Comment: Reticulocyte Hemoglobin testing may be clinically indicated, consider ordering this additional test [id number]    HCT 25.5 (L) 39.0 - 52.0 %   MCV 76.8 (L) 80.0 - 100.0 fL   MCH 24.7 (L) 26.0 - 34.0 pg   MCHC 32.2 30.0 - 36.0 g/dL   RDW 46.9 (H) 62.9 - 52.8 %   Platelets 129 (L) 150 - 400 K/uL    Comment: SPECIMEN CHECKED FOR CLOTS REPEATED TO VERIFY    nRBC 0.1 0.0 - 0.2 %    Comment: Performed at [facility], [address] [place], [place] [address]  Basic metabolic panel     Status: Abnormal   Collection Time: 12/19/22  3:25 AM  Result Value Ref Range   Sodium 139 135 - 145 mmol/L   Potassium 3.0 (L) 3.5 - 5.1 mmol/L   Chloride 108 98 - 111 mmol/L   CO2 22 22 - 32 mmol/L   Glucose, Bld 98 70 - 99 mg/dL  Comment: Glucose reference range applies only to samples taken after fasting for at least 8 hours.   BUN 11 8 - 23 mg/dL   Creatinine, Ser 6.57 0.61 - 1.24 mg/dL   Calcium 8.9 8.9 - 84.6 mg/dL   GFR, Estimated >96 >29 mL/min    Comment: (NOTE) Calculated using the CKD-EPI Creatinine Equation (2021)    Anion gap 9 5 - 15    Comment: Performed at [facility], [address] [place], [place] [address]  Magnesium     Status: None   Collection Time: 12/19/22  3:25 AM  Result Value Ref Range   Magnesium 2.1 1.7 - 2.4 mg/dL    Comment: Performed at [facility], [address] [place], [place] [address]  Phosphorus     Status: Abnormal   Collection Time: 12/19/22  3:25 AM  Result Value Ref Range   Phosphorus 1.6 (L) 2.5 - 4.6 mg/dL    Comment: Performed at [facility], [address] [place], [place] [address]  Hepatic function panel     Status: Abnormal   Collection Time: 12/19/22  3:25 AM  Result Value Ref Range   Total Protein 5.2 (L) 6.5 - 8.1 g/dL   Albumin 1.6 (L) 3.5 - 5.0 g/dL   AST 42 (H) 15 - 41 U/L   ALT 26 0 - 44 U/L   Alkaline Phosphatase 63 38 -  126 U/L   Total Bilirubin 0.8 0.3 - 1.2 mg/dL   Bilirubin, Direct 0.3 (H) 0.0 - 0.2 mg/dL   Indirect Bilirubin 0.5 0.3 - 0.9 mg/dL    Comment: Performed at [facility], [address] [place], [place] [address]  Troponin I (High Sensitivity)     Status: Abnormal   Collection Time: 12/19/22  3:25 AM  Result Value Ref Range   Troponin I (High Sensitivity) 48 (H) <18 ng/L    Comment: (NOTE) Elevated high sensitivity troponin I (hsTnI) values and significant  changes across serial measurements may suggest ACS but many other  chronic and acute conditions are known to elevate hsTnI results.  Refer to the "Links" section for chest pain algorithms and additional  guidance. Performed at [facility], [address], [place], [place] [address]   Lactic acid, plasma     Status: Abnormal   Collection Time: 12/19/22  3:25 AM  Result Value Ref Range   Lactic Acid, Venous 3.7 (HH) 0.5 - 1.9 mmol/L    Comment: CRITICAL VALUE NOTED. VALUE IS CONSISTENT WITH PREVIOUSLY REPORTED/CALLED VALUE Performed at [facility], [address] [place], [place] [address]   Heparin level (unfractionated)     Status: None   Collection Time: 12/19/22  9:49 AM  Result Value Ref Range   Heparin Unfractionated 0.36 0.30 - 0.70 IU/mL    Comment: (NOTE) The clinical reportable range upper limit is being lowered to >1.10 to align with the FDA approved guidance for the current laboratory assay.  If heparin results are below expected values, and patient dosage has  been confirmed, suggest follow up testing of antithrombin III levels. Performed at [facility], [address], [place], [place] [address]     MICRO: reviewed IMAGING: CT FEMUR RIGHT WO CONTRAST  Result Date: 12/19/2022 CLINICAL DATA:  Right leg infection. Evaluate for necrotizing fasciitis. EXAM: CT OF THE LOWER RIGHT EXTREMITY WITHOUT CONTRAST TECHNIQUE: Multidetector CT  imaging of the right thigh and lower leg was performed according to  the standard protocol. RADIATION DOSE REDUCTION: This exam was performed according to the departmental dose-optimization program which includes automated exposure control, adjustment of the mA and/or kV according to patient size and/or use of iterative reconstruction technique. COMPARISON:  None Available. FINDINGS: Bones/Joint/Cartilage Prior right hip arthroplasty. 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 The distal aspect of the fracture remains nonunited. No new fracture or dislocation. Mild periosteal reaction along the lateral aspect of the mid to distal fibular diaphysis (series 7, image 203). Degenerative changes of the right knee. Symmetric trace to small bilateral knee joint effusions. Ligaments Ligaments are suboptimally evaluated by CT. Muscles and Tendons Grossly intact. Soft tissue Right thigh and lower leg soft tissue swelling. 6.6 cm soft tissue ulcer along the lateral aspect of the mid to distal lower leg with a few tiny foci of subcutaneous emphysema. The ulcer extends close to the outer cortex of the fibular shaft. No fluid collection or hematoma. No soft tissue mass. Please see same day CT abdomen pelvis report for intrapelvic findings. IMPRESSION: 1. 6.6 cm soft tissue ulcer along the lateral aspect of the mid to distal lower leg extending close to the underlying fibular diaphysis, with mild fibular periosteal reaction concerning for osteomyelitis. 2. No evidence of necrotizing fasciitis.  No definite abscess. 3. 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 The distal aspect of the fracture remains nonunited. Electronically Signed   By: Obie Dredge M.D.   On: 12/19/2022 12:13   CT TIBIA FIBULA RIGHT WO CONTRAST  Result Date:  12/19/2022 CLINICAL DATA:  Right leg infection. Evaluate for necrotizing fasciitis. EXAM: CT OF THE LOWER RIGHT EXTREMITY WITHOUT CONTRAST TECHNIQUE: Multidetector CT imaging of the right thigh and lower leg was performed according to the standard protocol. RADIATION DOSE REDUCTION: This exam was performed according to the departmental dose-optimization program which includes automated exposure control, adjustment of the mA and/or kV according to patient size and/or use of iterative reconstruction technique. COMPARISON:  None Available. FINDINGS: Bones/Joint/Cartilage Prior right hip arthroplasty. 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 The distal aspect of the fracture remains nonunited. No new fracture or dislocation. Mild periosteal reaction along the lateral aspect of the mid to distal fibular diaphysis (series 7, image 203). Degenerative changes of the right knee. Symmetric trace to small bilateral knee joint effusions. Ligaments Ligaments are suboptimally evaluated by CT. Muscles and Tendons Grossly intact. Soft tissue Right thigh and lower leg soft tissue swelling. 6.6 cm soft tissue ulcer along the lateral aspect of the mid to distal lower leg with a few tiny foci of subcutaneous emphysema. The ulcer extends close to the outer cortex of the fibular shaft. No fluid collection or hematoma. No soft tissue mass. Please see same day CT abdomen pelvis report for intrapelvic findings. IMPRESSION: 1. 6.6 cm soft tissue ulcer along the lateral aspect of the mid to distal lower leg extending close to the underlying fibular diaphysis, with mild fibular periosteal reaction concerning for osteomyelitis. 2. No evidence of necrotizing fasciitis.  No definite abscess. 3. 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 The distal aspect of  the fracture remains nonunited. Electronically Signed   By: Obie Dredge M.D.   On: 12/19/2022 12:13   CT ABDOMEN PELVIS WO CONTRAST  Result Date:  12/19/2022 CLINICAL DATA:  decubitus ulcer, osteomyelitis EXAM: CT ABDOMEN AND PELVIS WITHOUT CONTRAST TECHNIQUE: Multidetector CT imaging of the abdomen and pelvis was performed following the standard protocol without IV contrast. RADIATION DOSE REDUCTION: This exam was performed according to the departmental dose-optimization program which includes automated exposure control, adjustment of the mA and/or kV according to patient size and/or use of iterative reconstruction technique. COMPARISON:  CT chest 12/18/2022. FINDINGS: Lower chest: Trace left pleural effusion with left greater than right bibasilar airspace opacities, slightly progressed. Small-moderate pericardial effusion, unchanged. Hepatobiliary: Two cysts in the right hepatic lobe, largest measuring up to 3.0 cm. Otherwise unremarkable unenhanced appearance of the liver. Layering hyperdense material within the gallbladder is likely related to vicarious contrast excretion. Pancreas: Unremarkable. No pancreatic ductal dilatation or surrounding inflammatory changes. Spleen: Normal in size without focal abnormality. Adrenals/Urinary Tract: Unremarkable adrenal glands. Bilateral renal cysts, which do not require follow-up imaging. There is contrast excretion within the bilateral renal collecting systems and urinary bladder. No hydronephrosis. Stomach/Bowel: Stomach within normal limits. No dilated loops of bowel to suggest obstruction. Extensive diverticulosis. No focal bowel wall thickening is evident. A rectal tube is in place. Vascular/Lymphatic: Scattered aortoiliac atherosclerotic calcifications without aneurysm. No abdominopelvic lymphadenopathy. Reproductive: Prostate is unremarkable. Other: No free air or free fluid.  Mild mesenteric edema. Musculoskeletal: Sacral decubitus ulcer overlying the distal  sacrum and coccyx. Loss of cortical definition of the posterior cortex of the distal most sacral segment suspicious for osteomyelitis (series 2, image 69). There is soft tissue edema and numerous foci of air within the soft tissues adjacent to the ulceration site. No organized or drainable fluid collections. Ankylosis of the imaged thoracolumbar spine. Large anterior endplate osteophyte at L3-L4. Both SI joints are fused. No acute fractures are identified. Prior right total hip arthroplasty with proximal cerclage wires. IMPRESSION: 1. Sacral decubitus ulcer overlying the distal sacrum and coccyx. Loss of cortical definition of the posterior cortex of the distal-most sacral segment suggestive of osteomyelitis. There is soft tissue edema and numerous foci of air within the soft tissues adjacent to the ulceration site. No organized or drainable fluid collections. 2. Trace left pleural effusion with left greater than right bibasilar airspace opacities, slightly progressed. 3. Small-moderate pericardial effusion, unchanged. 4. Extensive diverticulosis without evidence of acute diverticulitis. 5. 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 6. Aortic atherosclerosis (ICD10-I70.0). Electronically Signed   By: Duanne Guess D.O.   On: 12/19/2022 12:05   ECHOCARDIOGRAM COMPLETE  Result Date: 12/19/2022    ECHOCARDIOGRAM REPORT   Patient Name:   KROSS NICASIO Date of Exam: 12/19/2022 Medical Rec #:  [id number]       Height:       71.0 in Accession #:    [id number]      Weight:       129.4 lb Date of Birth:  08/29/1933       BSA:          1.752 m Patient Age:    88 years        BP:           111/60 mmHg Patient Gender: M               HR:           121 bpm. Exam Location:  Inpatient Procedure: 2D Echo, Cardiac Doppler and Color Doppler Indications:    Pulmonary Embolus  History:        Patient has no prior history of Echocardiogram  examinations.                 Arrythmias:Tachycardia, Signs/Symptoms:cough;  Risk                 Factors:sepsis, LE ulcers.  Sonographer:    Wallie Char Referring Phys: [id number] ROBERT S BYRUM  Sonographer Comments: Technically challenging study due to limited acoustic windows. IMPRESSIONS  1. Left ventricular ejection fraction, by estimation, is 55 to 60%. The left ventricle has normal function. The left ventricle has no regional wall motion abnormalities. Left ventricular diastolic parameters are consistent with Grade I diastolic dysfunction (impaired relaxation).  2. Right ventricular systolic function is normal. The right ventricular size is normal. Tricuspid regurgitation signal is inadequate for assessing PA pressure.  3. Moderate pericardial effusion. The pericardial effusion is anterior to the right ventricle.  4. The mitral valve is normal in structure. No evidence of mitral valve regurgitation. No evidence of mitral stenosis.  5. The aortic valve is tricuspid. There is mild calcification of the aortic valve. Aortic valve regurgitation is not visualized. Aortic valve sclerosis/calcification is present, without any evidence of aortic stenosis.  6. The inferior vena cava is normal in size with greater than 50% respiratory variability, suggesting right atrial pressure of 3 mmHg. Conclusion(s)/Recommendation(s): There is a moderate-sized anterior pericardial effusion without evidence of overt tamponade. there is no RV strain. FINDINGS  Left Ventricle: Left ventricular ejection fraction, by estimation, is 55 to 60%. The left ventricle has normal function. The left ventricle has no regional wall motion abnormalities. The left ventricular internal cavity size was normal in size. There is  no left ventricular hypertrophy. Left ventricular diastolic parameters are consistent with Grade I diastolic dysfunction (impaired relaxation). Right Ventricle: The right ventricular size is normal. No increase in right ventricular wall thickness. Right ventricular systolic function is normal. Tricuspid  regurgitation signal is inadequate for assessing PA pressure. Left Atrium: Left atrial size was normal in size. Right Atrium: Right atrial size was normal in size. Pericardium: A moderately sized pericardial effusion is present. The pericardial effusion is anterior to the right ventricle. Mitral Valve: The mitral valve is normal in structure. No evidence of mitral valve regurgitation. No evidence of mitral valve stenosis. MV peak gradient, 1.9 mmHg. The mean mitral valve gradient is 1.0 mmHg. Tricuspid Valve: The tricuspid valve is normal in structure. Tricuspid valve regurgitation is trivial. No evidence of tricuspid stenosis. Aortic Valve: The aortic valve is tricuspid. There is mild calcification of the aortic valve. Aortic valve regurgitation is not visualized. Aortic valve sclerosis/calcification is present, without any evidence of aortic stenosis. Aortic valve mean gradient measures 2.0 mmHg. Aortic valve peak gradient measures 2.4 mmHg. Aortic valve area, by VTI measures 2.81 cm. Pulmonic Valve: The pulmonic valve was normal in structure. Pulmonic valve regurgitation is not visualized. No evidence of pulmonic stenosis. Aorta: The aortic root is normal in size and structure. Venous: The inferior vena cava is normal in size with greater than 50% respiratory variability, suggesting right atrial pressure of 3 mmHg. IAS/Shunts: No atrial level shunt detected by color flow Doppler.  LEFT VENTRICLE PLAX 2D LVIDd:         4.60 cm     Diastology LVIDs:         3.40 cm     LV e' medial:    6.73 cm/s LV PW:         1.00 cm     LV E/e' medial:  9.6 LV IVS:  0.70 cm     LV e' lateral:   7.50 cm/s LVOT diam:     2.30 cm     LV E/e' lateral: 8.6 LV SV:         50 LV SV Index:   28 LVOT Area:     4.15 cm  LV Volumes (MOD) LV vol d, MOD A2C: 45.0 ml LV vol d, MOD A4C: 58.9 ml LV vol s, MOD A2C: 20.3 ml LV vol s, MOD A4C: 27.6 ml LV SV MOD A2C:     24.7 ml LV SV MOD A4C:     58.9 ml LV SV MOD BP:      29.1 ml RIGHT  VENTRICLE             IVC RV Basal diam:  3.80 cm     IVC diam: 1.60 cm RV S prime:     26.10 cm/s TAPSE (M-mode): 2.0 cm LEFT ATRIUM           Index        RIGHT ATRIUM           Index LA diam:      3.20 cm 1.83 cm/m   RA Area:     14.70 cm LA Vol (A4C): 20.7 ml 11.81 ml/m  RA Volume:   36.40 ml  20.77 ml/m  AORTIC VALVE AV Area (Vmax):    2.83 cm AV Area (Vmean):   2.48 cm AV Area (VTI):     2.81 cm AV Vmax:           78.00 cm/s AV Vmean:          58.600 cm/s AV VTI:            0.177 m AV Peak Grad:      2.4 mmHg AV Mean Grad:      2.0 mmHg LVOT Vmax:         53.20 cm/s LVOT Vmean:        35.000 cm/s LVOT VTI:          0.120 m LVOT/AV VTI ratio: 0.68  AORTA Ao Root diam: 3.60 cm MITRAL VALVE               TRICUSPID VALVE MV Area (PHT): 3.39 cm    TR Peak grad:   27.0 mmHg MV Area VTI:   3.30 cm    TR Vmax:        260.00 cm/s MV Peak grad:  1.9 mmHg MV Mean grad:  1.0 mmHg    SHUNTS MV Vmax:       0.68 m/s    Systemic VTI:  0.12 m MV Vmean:      44.6 cm/s   Systemic Diam: 2.30 cm MV Decel Time: 224 msec MV E velocity: 64.60 cm/s MV A velocity: 66.80 cm/s MV E/A ratio:  0.97 Arvilla Meres MD Electronically signed by Arvilla Meres MD Signature Date/Time: 12/19/2022/11:29:18 AM    Final    CT Angio Chest PE W and/or Wo Contrast  Result Date: 12/18/2022 CLINICAL DATA:  Pulmonary embolism suspected. High probability. Shortness of breath and cough. EXAM: CT ANGIOGRAPHY CHEST WITH CONTRAST TECHNIQUE: Multidetector CT imaging of the chest was performed using the standard protocol during bolus administration of intravenous contrast. Multiplanar CT image reconstructions and MIPs were obtained to evaluate the vascular anatomy. RADIATION DOSE REDUCTION: This exam was performed according to the departmental dose-optimization program which includes automated exposure control, adjustment of the mA and/or kV according to patient  size and/or use of iterative reconstruction technique. CONTRAST:  75mL OMNIPAQUE  IOHEXOL 350 MG/ML SOLN COMPARISON:  Portable chest today portable chest 11/29/2022. No prior cross-sectional imaging for comparison. FINDINGS: Cardiovascular: The pulmonary trunk is prominent measuring 3.3 cm, and there is also elevated RV/LV ratio of 1.2 consistent with right heart dysfunction/strain. There are no central arterial clots, no large emboli. There is a small thrombus in a posterior basal right lower lobe segmental artery on 4: 97-100. On the left there are subsegmental lingular emboli on 4: 83-84 and 4:94-100. In the left lower lobe there is a lateral basal subsegmental embolus on 4: 100-104 with extension into the downstream small arteries in this distribution. No further emboli are seen. The heart is mildly enlarged. Pericardial effusion noted circumferentially up to maximum 2 cm in thickness to the right. The pulmonary veins are normal caliber. The great vessels are clear and branch normally. There is mild aortic atherosclerosis and tortuosity, borderline dilatation of the aortic root at the sinuses of Valsalva measuring 3.9 cm, and a slightly dilated ascending aorta measuring 4.2 cm. There is no aortic or great vessel dissection or stenosis. There are no visible coronary calcific plaques. There are small air pockets in both internal jugular veins, most likely either injected at the time of contrast injection or with placement of the patient's IV. There is no central air embolus. Mediastinum/Nodes: There is a heterogeneous 1 cm nodule in the left lobe of the thyroid gland.No follow-up imaging is recommended. Reference: J Am Coll Radiol. 2015 Feb;12(2): 143-50. There is no supraclavicular, axillary or intrathoracic adenopathy. Thoracic esophagus, both main bronchi and thoracic trachea are unremarkable. Lungs/Pleura: Minimal centrilobular and paraseptal emphysematous change both lung apices. Diffuse bronchial thickening. Scattered subsegmental bronchial impactions are noted in the posterior basal left  lower lobe with patchy consolidation which is most likely due to bronchopneumonia although component of infarct is possible. There is mild posterior atelectasis in the right lower lobe. No bronchial impactions are seen on the right. The lungs are otherwise clear. There is a small posteromedial right diaphragmatic fat herniation into the lower chest. There are trace pleural effusions. No pneumothorax or pleural thickening. Upper Abdomen: No acute radiographic findings. Streak artifact limits fine detail due to the patient's arms in the field. There is a 3.2 cm simple appearing cyst in the superior pole of the right kidney with a Hounsfield density of 11.4. No follow-up imaging is recommended. In hepatic segment 5 there is an indeterminate hypodense lesion measuring 3.2 cm with appearance of vessels at its periphery, possibly a hemangioma but further evaluation is recommended. Musculoskeletal: There is multilevel bridging enthesopathy of the thoracic spine. No spinal compression fracture or other acute or significant osseous findings. The ribcage is intact. No chest wall mass. Review of the MIP images confirms the above findings. IMPRESSION: 1. Positive for acute PE with CT findings of right heart strain (RV/LV Ratio = 1.2 ) consistent with at least submassive (intermediate risk) PE, although the visible clot burden is small. The presence of right heart strain has been associated with an increased risk of morbidity and mortality. Please refer to the "Code PE Focused" order set in EPIC. 2. Cardiomegaly with pericardial effusion. 3. Aortic atherosclerosis with borderline dilatation of the aortic root at the sinuses of Valsalva, and slightly dilated ascending aorta at 4.2 cm. No dissection or stenosis. Recommend annual imaging followup by CTA or MRA. This recommendation follows 2010 ACCF/AHA/AATS/ACR/ASA/SCA/SCAI/SIR/STS/SVM Guidelines for the Diagnosis and Management of Patients with Thoracic Aortic  Disease.  Circulation. 2010; 121: Z308-M578. Aortic aneurysm NOS (ICD10-I71.9) 4. Bronchitis with scattered subsegmental bronchial impactions or aspiration in the posterior basal left lower lobe, with patchy consolidation which is most likely due to bronchopneumonia although component of infarct is possible. Follow-up study recommended after treatment to ensure clearing. 5. Trace pleural effusions. 6. Indeterminate 3.2 cm hypodense lesion in hepatic segment 5. Follow-up nonemergent MRI or CT without and with contrast recommended. 7. Small air pockets in both internal jugular veins, most likely either injected at the time of contrast injection or with placement of the patient's IV. 8. Emphysema. 9. Critical Value/emergent results were called by telephone at the time of interpretation on 12/18/2022 at 10:23 pm to provider [facility] , who verbally acknowledged these results. Aortic Atherosclerosis (ICD10-I70.0) and Emphysema (ICD10-J43.9). Electronically Signed   By: Almira Bar M.D.   On: 12/18/2022 22:32   DG Chest Port 1 View  Result Date: 12/18/2022 CLINICAL DATA:  Shortness of breath and cough EXAM: PORTABLE CHEST 1 VIEW COMPARISON:  Radiograph 11/29/2022 FINDINGS: Stable cardiomediastinal silhouette. No focal consolidation, pleural effusion, or pneumothorax. No displaced rib fractures. Advanced arthritis both shoulders. IMPRESSION: No active disease. Electronically Signed   By: Minerva Fester M.D.   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 Polymicrobial bacteremia likely from sacral wound burden. The multiple PE could be explained by being bedbound but concern that he may also have endocarditis. - recommend TTE, probably would not pursue  TEE due to risk of aspiration - would treat empirically for endocarditis/osteomyelitis. Currently on piptazo which would cover ecoli and e.faecalis.  - consider  to switch to amp plus ceftriaxone and metronidazole (anaerobic component for sacral osteomyelitis) if concern for endocarditis.  - plan for 6 wk of treatment - recommend to get repeat blood cx on 7/14 - sacral decub and right lower leg wound = recommend wound care to see if patient is candidate for hydro therapy/medihoney for enzymatic debridement.  - acute PE = continue on anticoagulation  Timiyah Romito B. Drue Second MD MPH [facility] [phone]

## 2022-12-19 NOTE — ED Notes (Signed)
Pt was in CT getting CT Angio at time sepsis timer activated.

## 2022-12-19 NOTE — Progress Notes (Addendum)
Pharmacy Antibiotic Note  Edwin [name] is a 89 y.o. male admitted on 12/18/2022 with history of dementia, right hip fracture/hemiarthroplasty 05/2022, bedridden and nonverbal at baseline. Pt found to be hypotensive and tachycardic in the ED, also noted to have a large sacral decubitus ulcer, right lower extremity decubitus ulcer with significant breakdown and malodorous drainage.   Pharmacy has been consulted to dose vancomycin and cefepime for sepsis.  1st doses given in the ED  Plan: Vancomycin 1250mg  IV q24h (AUC 480, Scr 0.81, TBW) Cefepime 2gm IV q8h Follow renal function, cultures and clinical course  Height: 5\' 11"  (180.3 cm) Weight: 65.8 kg (145 lb) IBW/kg (Calculated) : 75.3  Temp (24hrs), Avg:99.2 F (37.3 C), Min:98.3 F (36.8 C), Max:100 F (37.8 C)  Recent Labs  Lab 12/18/22 1807 12/18/22 2125  WBC 9.1  --   CREATININE 0.81  --   LATICACIDVEN  --  3.5*    Estimated Creatinine Clearance: 60.9 mL/min (by C-G formula based on SCr of 0.81 mg/dL).    Allergies  Allergen Reactions   Milk-Related Compounds Other (See Comments)    NO DAIRY, NO YOGURT, NO ICE CREAM, NO CHEESE, NO MILK (PREFERENCE)   Beef-Derived Products Other (See Comments)    Requested by wife.   Egg-Derived Products Other (See Comments)    Patient wife states they are vegetarian, eggs are not apart of their diet.    Other Other (See Comments)    Unnamed antibiotic- "Made him sick"- no further elaboration  Patient prefers to not take medication, in general    Antimicrobials this admission: 7/12 cefepime >> 7/13 vanc >>  Dose adjustments this admission:   Microbiology results: 7/12 BCx:   Thank you for allowing pharmacy to be a part of this patient's care.  Arley Phenix RPh 12/19/2022, 12:36 AM

## 2022-12-19 NOTE — Progress Notes (Signed)
ANTICOAGULATION CONSULT NOTE - Follow Up Consult  Pharmacy Consult for Heparin Indication: pulmonary embolus  Allergies  Allergen Reactions   Milk-Related Compounds Other (See Comments)    NO DAIRY, NO YOGURT, NO ICE CREAM, NO CHEESE, NO MILK (PREFERENCE)   Beef-Derived Products Other (See Comments)    Requested by wife.   Egg-Derived Products Other (See Comments)    Patient wife states they are vegetarian, eggs are not apart of their diet.    Other Other (See Comments)    Unnamed antibiotic- "Made him sick"- no further elaboration  Patient prefers to not take medication, in general    Patient Measurements: Height: 5\' 11"  (180.3 cm) Weight: 58.7 kg (129 lb 6.6 oz) IBW/kg (Calculated) : 75.3 Heparin Dosing Weight: TBW  Vital Signs: Temp: 97.3 F (36.3 C) (07/13 0343) Temp Source: Axillary (07/13 0343) BP: 120/54 (07/13 0500) Pulse Rate: 26 (07/13 0400)  Labs: Recent Labs    12/18/22 1807 12/18/22 2125 12/19/22 0325  HGB 9.4*  --  8.2*  HCT 29.2*  --  25.5*  PLT 153  --  129*  APTT 28  --   --   LABPROT 16.7*  --   --   INR 1.3*  --   --   CREATININE 0.81  --  0.61  TROPONINIHS  --  59* 48*    Estimated Creatinine Clearance: 55 mL/min (by C-G formula based on SCr of 0.61 mg/dL).   Medications:  Infusions:   sodium chloride     sodium chloride     ceFEPime (MAXIPIME) IV     heparin 1,100 Units/hr (12/19/22 8657)   lactated ringers     lactated ringers     norepinephrine (LEVOPHED) Adult infusion 1 mcg/min (12/19/22 8469)   potassium PHOSPHATE IVPB (in mmol) 64.4 mL/hr at 12/19/22 0632   [START ON 12/20/2022] vancomycin      Assessment: 40 male with advanced dementia presents for evaluation of abnormal vital signs. CT shows PE, pharmacy to dose heparin drip. No prior AC noted   Today, 12/19/2022: Heparin level 0.36, therapeutic on heparin 1100 units/hr CBC: Hgb decreased to 8.2 (baseline in 8-9 range), Plt down to 129 No bleeding or complications  reported. No pauses, interruptions, or IV issues per RN.    Goal of Therapy:  Heparin level 0.3-0.7 units/ml Monitor platelets by anticoagulation protocol: Yes   Plan:  Continue heparin IV infusion at 1100 units/hr Confirmatory heparin level in 8 hours Daily heparin level and CBC   Lynann Beaver PharmD, BCPS [facility] main pharmacy [phone] 12/19/2022 10:54 AM

## 2022-12-20 ENCOUNTER — Encounter (HOSPITAL_COMMUNITY): Payer: Medicare HMO

## 2022-12-20 DIAGNOSIS — E876 Hypokalemia: Secondary | ICD-10-CM | POA: Diagnosis not present

## 2022-12-20 DIAGNOSIS — A419 Sepsis, unspecified organism: Secondary | ICD-10-CM | POA: Diagnosis not present

## 2022-12-20 DIAGNOSIS — I2699 Other pulmonary embolism without acute cor pulmonale: Secondary | ICD-10-CM | POA: Diagnosis not present

## 2022-12-20 DIAGNOSIS — L89159 Pressure ulcer of sacral region, unspecified stage: Secondary | ICD-10-CM | POA: Diagnosis not present

## 2022-12-20 DIAGNOSIS — R6521 Severe sepsis with septic shock: Secondary | ICD-10-CM | POA: Diagnosis not present

## 2022-12-20 LAB — BASIC METABOLIC PANEL
Anion gap: 7 (ref 5–15)
BUN: 9 mg/dL (ref 8–23)
CO2: 23 mmol/L (ref 22–32)
Calcium: 8.2 mg/dL — ABNORMAL LOW (ref 8.9–10.3)
Chloride: 110 mmol/L (ref 98–111)
Creatinine, Ser: 0.4 mg/dL — ABNORMAL LOW (ref 0.61–1.24)
GFR, Estimated: >60 mL/min (ref >60)
Glucose, Bld: 83 mg/dL (ref 70–99)
Potassium: 3.2 mmol/L — ABNORMAL LOW (ref 3.5–5.1)
Sodium: 140 mmol/L (ref 135–145)

## 2022-12-20 LAB — HEPARIN LEVEL (UNFRACTIONATED)
Heparin Unfractionated: 0.28 IU/mL — ABNORMAL LOW (ref 0.30–0.70)
Heparin Unfractionated: 0.51 IU/mL (ref 0.30–0.70)
Heparin Unfractionated: 0.66 IU/mL (ref 0.30–0.70)

## 2022-12-20 LAB — CBC
HCT: 24.7 % — ABNORMAL LOW (ref 39.0–52.0)
Hemoglobin: 8.1 g/dL — ABNORMAL LOW (ref 13.0–17.0)
MCH: 23.6 pg — ABNORMAL LOW (ref 26.0–34.0)
MCHC: 32.8 g/dL (ref 30.0–36.0)
MCV: 72 fL — ABNORMAL LOW (ref 80.0–100.0)
Platelets: 140 10*3/uL — ABNORMAL LOW (ref 150–400)
RBC: 3.43 MIL/uL — ABNORMAL LOW (ref 4.22–5.81)
RDW: 16.8 % — ABNORMAL HIGH (ref 11.5–15.5)
WBC: 16.3 10*3/uL — ABNORMAL HIGH (ref 4.0–10.5)
nRBC: 0.1 % (ref 0.0–0.2)

## 2022-12-20 LAB — MAGNESIUM: Magnesium: 1.7 mg/dL (ref 1.7–2.4)

## 2022-12-20 LAB — PHOSPHORUS: Phosphorus: 2.8 mg/dL (ref 2.5–4.6)

## 2022-12-20 MED ORDER — MAGNESIUM SULFATE 2 GM/50ML IV SOLN
2.0000 g | Freq: Once | INTRAVENOUS | Status: AC
  Administered 2022-12-20: 2 g via INTRAVENOUS
  Filled 2022-12-20: qty 50

## 2022-12-20 MED ORDER — POTASSIUM CHLORIDE 10 MEQ/100ML IV SOLN
10.0000 meq | INTRAVENOUS | Status: AC
  Administered 2022-12-20 (×6): 10 meq via INTRAVENOUS
  Filled 2022-12-20 (×6): qty 100

## 2022-12-20 NOTE — Plan of Care (Signed)
  Problem: Clinical Measurements: Goal: Ability to maintain clinical measurements within normal limits will improve Outcome: Progressing Goal: Diagnostic test results will improve Outcome: Progressing Goal: Respiratory complications will improve Outcome: Progressing Goal: Cardiovascular complication will be avoided Outcome: Progressing   Problem: Safety: Goal: Ability to remain free from injury will improve Outcome: Progressing   Problem: Clinical Measurements: Goal: Will remain free from infection Outcome: Not Progressing   Problem: Activity: Goal: Risk for activity intolerance will decrease Outcome: Not Progressing   Problem: Nutrition: Goal: Adequate nutrition will be maintained Outcome: Not Progressing   Problem: Skin Integrity: Goal: Risk for impaired skin integrity will decrease Outcome: Not Progressing

## 2022-12-20 NOTE — Progress Notes (Signed)
ANTICOAGULATION CONSULT NOTE - Follow Up Consult  Pharmacy Consult for Heparin Indication: pulmonary embolus  Allergies  Allergen Reactions   Milk-Related Compounds Other (See Comments)    NO DAIRY, NO YOGURT, NO ICE CREAM, NO CHEESE, NO MILK (PREFERENCE)   Beef-Derived Products Other (See Comments)    Requested by wife.   Egg-Derived Products Other (See Comments)    Patient wife states they are vegetarian, eggs are not apart of their diet.    Other Other (See Comments)    Unnamed antibiotic- "Made him sick"- no further elaboration  Patient prefers to not take medication, in general    Patient Measurements: Height: 5\' 11"  (180.3 cm) Weight: 64.7 kg (142 lb 10.2 oz) IBW/kg (Calculated) : 75.3 Heparin Dosing Weight: TBW  Vital Signs: Temp: 97.9 F (36.6 C) (07/13 2000) Temp Source: Axillary (07/13 2000) BP: 125/66 (07/14 0000) Pulse Rate: 89 (07/14 0000)  Labs: Recent Labs    12/18/22 1807 12/18/22 2125 12/19/22 0325 12/19/22 0949 12/19/22 1849 12/20/22 0337  HGB 9.4*  --  8.2*  --   --  8.1*  HCT 29.2*  --  25.5*  --   --  24.7*  PLT 153  --  129*  --   --  140*  APTT 28  --   --   --   --   --   LABPROT 16.7*  --   --   --   --   --   INR 1.3*  --   --   --   --   --   HEPARINUNFRC  --   --   --  0.36 0.23* 0.28*  CREATININE 0.81  --  0.61  --   --  0.40*  TROPONINIHS  --  59* 48*  --   --   --     Estimated Creatinine Clearance: 60.7 mL/min (A) (by C-G formula based on SCr of 0.4 mg/dL (L)).   Medications:  Infusions:   sodium chloride     sodium chloride     ampicillin (OMNIPEN) IV Stopped (12/20/22 0408)   cefTRIAXone (ROCEPHIN)  IV Stopped (12/19/22 1855)   heparin 1,250 Units/hr (12/20/22 0416)   lactated ringers     metronidazole Stopped (12/20/22 0013)   norepinephrine (LEVOPHED) Adult infusion 2 mcg/min (12/20/22 0416)    Assessment: 51 male with advanced dementia presents for evaluation of abnormal vital signs. CT shows PE, pharmacy to dose  heparin drip. No prior AC noted    12/20/2022 HL 0.28 sub-therapeutic on 1250 units/hr Hgb 8.1, plts 140 Per RN no bleeding noted   Goal of Therapy:  Heparin level 0.3-0.7 units/ml Monitor platelets by anticoagulation protocol: Yes   Plan:  Increase heparin drip to 1400 units/hr  heparin level in 8 hours Daily heparin level and CBC   Arley Phenix RPh 12/20/2022, 4:41 AM

## 2022-12-20 NOTE — Progress Notes (Signed)
ANTICOAGULATION CONSULT NOTE - Follow Up Consult  Pharmacy Consult for Heparin Indication: pulmonary embolus  Allergies  Allergen Reactions   Milk-Related Compounds Other (See Comments)    NO DAIRY, NO YOGURT, NO ICE CREAM, NO CHEESE, NO MILK (PREFERENCE)   Beef-Derived Products Other (See Comments)    Requested by wife.   Egg-Derived Products Other (See Comments)    Patient wife states they are vegetarian, eggs are not apart of their diet.    Other Other (See Comments)    Unnamed antibiotic- "Made him sick"- no further elaboration  Patient prefers to not take medication, in general    Patient Measurements: Height: 5\' 11"  (180.3 cm) Weight: 64.7 kg (142 lb 10.2 oz) IBW/kg (Calculated) : 75.3 Heparin Dosing Weight: TBW  Vital Signs: Temp: 97.7 F (36.5 C) (07/14 1955) Temp Source: Axillary (07/14 1955) BP: 109/60 (07/14 2100) Pulse Rate: 89 (07/14 2100)  Labs: Recent Labs    12/18/22 1807 12/18/22 2125 12/19/22 0325 12/19/22 0949 12/20/22 0337 12/20/22 1328 12/20/22 2327  HGB 9.4*  --  8.2*  --  8.1*  --   --   HCT 29.2*  --  25.5*  --  24.7*  --   --   PLT 153  --  129*  --  140*  --   --   APTT 28  --   --   --   --   --   --   LABPROT 16.7*  --   --   --   --   --   --   INR 1.3*  --   --   --   --   --   --   HEPARINUNFRC  --   --   --    < > 0.28* 0.51 0.66  CREATININE 0.81  --  0.61  --  0.40*  --   --   TROPONINIHS  --  59* 48*  --   --   --   --    < > = values in this interval not displayed.    Estimated Creatinine Clearance: 60.7 mL/min (A) (by C-G formula based on SCr of 0.4 mg/dL (L)).   Medications:  Infusions:   sodium chloride     sodium chloride     ampicillin (OMNIPEN) IV 2 g (12/20/22 2333)   cefTRIAXone (ROCEPHIN)  IV Stopped (12/20/22 1751)   heparin 1,400 Units/hr (12/20/22 2125)   lactated ringers     metronidazole Stopped (12/20/22 2324)    Assessment: 72 male with advanced dementia presents for evaluation of abnormal vital  signs. CT shows PE, pharmacy to dose heparin drip. No prior AC noted   Today, 12/20/2022: Confirmatory heparin level=0.66 No bleeding noted  Goal of Therapy:  Heparin level 0.3-0.7 units/ml Monitor platelets by anticoagulation protocol: Yes   Plan:  Continue heparin IV infusion at 1400 units/hr Daily heparin level and CBC   Arley Phenix RPh 12/20/2022, 11:59 PM

## 2022-12-20 NOTE — Consult Note (Signed)
Consultation Note Date: 12/20/2022   Patient Name: Edwin [name]  DOB: 27-May-1934  MRN: [id number]  Age / Sex: 89 y.o., male  PCP: [name], DO Referring Physician: Zigmund Daniel., *  Reason for Consultation: Establishing goals of care  HPI/Patient Profile: 89 y.o. male admitted on 12/18/2022    Clinical Assessment and Goals of Care: 89 year old gentleman who lives at home with his wife.  Patient has past medical history significant for dementia, history of right hip fracture status post hemiarthroplasty in December 2023.  Patient's baseline is such that he is wheelchair-bound and essentially immobile. Patient noted to have large sacral decubitus ulcer, right lower extremity decubitus ulcer with significant breakdown and malodorous drainage.  CTPA also showed pulmonary embolism as well as right heart strain. Patient admitted to hospitalist service, started on broad-spectrum antibiotics. Infectious disease and general surgery as well as pulmonary critical care medicine also consulted. From infectious disease standpoint patient is due to undergo 6 weeks of antibiotics and has a strong possibility of also having endocarditis.  He is to have repeat blood cultures today 12-20-2022. Palliative consult for ongoing goals of care discussions has been requested. Chart reviewed, palliative consult request received, patient seen and discussed with bedside nursing staff.  Subsequently, call placed and was able to reach patient's wife Danyelle Ditta at [phone]. Palliative medicine is specialized medical care for people living with serious illness. It focuses on providing relief from the symptoms and stress of a serious illness. The goal is to improve quality of life for both the patient and the family. Goals of care: Broad aims of medical therapy in relation to the patient's values and preferences. Our  aim is to provide medical care aimed at enabling patients to achieve the goals that matter most to them, given the circumstances of their particular medical situation and their constraints.    NEXT OF KIN Lives at home with wife [name] [phone].  SUMMARY OF RECOMMENDATIONS   Call placed and I was able to reach patient's wife [name].  She states that she has just received a medical update from Dr. Lowell Guitar.  CODE STATUS and goals of care discussions undertaken, however Ms. [name] quickly told me on the call that she does not want to discuss further and that she has no further questions about the patient's health at this time.  She thanked me for calling her and I was unable to continue further goals of care discussions.  Palliative care will remain available for further discussions and will follow peripherally for now. Thank you for the consult.   Code Status/Advance Care Planning: Full code   Symptom Management:     Palliative Prophylaxis:  Delirium Protocol  Additional Recommendations (Limitations, Scope, Preferences): Full Scope Treatment  Psycho-social/Spiritual:  Desire for further Chaplaincy support:yes Additional Recommendations: Caregiving  Support/Resources  Prognosis:  Unable to determine  Discharge Planning: To Be Determined      Primary Diagnoses: Present on Admission:  Septic shock (HCC)   I have reviewed the medical record, interviewed the  patient and family, and examined the patient. The following aspects are pertinent.  Past Medical History:  Diagnosis Date   Dementia [facility])    Social History   Socioeconomic History   Marital status: Married    Spouse name: Not on file   Number of children: Not on file   Years of education: Not on file   Highest education level: Not on file  Occupational History   Not on file  Tobacco Use   Smoking status: Never   Smokeless tobacco: Never  Substance and Sexual Activity   Alcohol use: Not Currently   Drug  use: Not Currently   Sexual activity: Not on file  Other Topics Concern   Not on file  Social History Narrative   Not on file   Social Determinants of Health   Financial Resource Strain: Not on file  Food Insecurity: Not on file  Transportation Needs: Not on file  Physical Activity: Not on file  Stress: Not on file  Social Connections: Not on file   Family History  Problem Relation Age of Onset   Dementia Sister    Dementia Brother    Scheduled Meds:  Chlorhexidine Gluconate Cloth  6 each Topical Daily   Continuous Infusions:  sodium chloride     sodium chloride     ampicillin (OMNIPEN) IV Stopped (12/20/22 1257)   cefTRIAXone (ROCEPHIN)  IV Stopped (12/20/22 0605)   heparin 1,400 Units/hr (12/20/22 1200)   lactated ringers     metronidazole Stopped (12/20/22 1127)   PRN Meds:.Place/Maintain arterial line **AND** sodium chloride, docusate sodium, fentaNYL (SUBLIMAZE) injection, ondansetron (ZOFRAN) IV, mouth rinse, polyethylene glycol, Vashe Wound Irrigation Optime Medications Prior to Admission:  Prior to Admission medications   Medication Sig Start Date End Date Taking? Authorizing Provider  Multiple Vitamin (MULTIVITAMIN WITH MINERALS) TABS tablet Take 1 tablet by mouth daily. 06/09/22  Yes Lanae Boast, MD  docusate (COLACE) 50 MG/5ML liquid Take 10 mLs (100 mg total) by mouth 2 (two) times daily. Patient not taking: Reported on 12/19/2022 06/08/22   Lanae Boast, MD  HYDROcodone-acetaminophen (NORCO/VICODIN) 5-325 MG tablet Take 1 tablet by mouth every 6 (six) hours as needed for moderate pain (pain score 4-6). Patient not taking: Reported on 12/19/2022 06/02/22   Hughie Closs, MD  Nutritional Supplements (FEEDING SUPPLEMENT, KATE FARMS STANDARD 1.4,) LIQD liquid Take 325 mLs by mouth 2 (two) times daily between meals. 06/08/22   Lanae Boast, MD   Allergies  Allergen Reactions   Milk-Related Compounds Other (See Comments)    NO DAIRY, NO YOGURT, NO ICE CREAM, NO CHEESE, NO  MILK (PREFERENCE)   Beef-Derived Products Other (See Comments)    Requested by wife.   Egg-Derived Products Other (See Comments)    Patient wife states they are vegetarian, eggs are not apart of their diet.    Other Other (See Comments)    Unnamed antibiotic- "Made him sick"- no further elaboration  Patient prefers to not take medication, in general   Review of Systems +weakness  Physical Exam Elderly gentleman resting in bed Monitor noted Unlabored respirations Resting in bed with eyes closed Has right lower extremity edema Wounds images noted on the chart  Vital Signs: BP 109/87 (BP Location: Right Arm)   Pulse (!) 108   Temp (!) 97.5 F (36.4 C) (Axillary)   Resp 18   Ht 5\' 11"  (1.803 m)   Wt 64.7 kg   SpO2 100%   BMI 19.89 kg/m  Pain Scale: PAINAD   Pain  Score: Asleep   SpO2: SpO2: 100 % O2 Device:SpO2: 100 % O2 Flow Rate: .   IO: Intake/output summary:  Intake/Output Summary (Last 24 hours) at 12/20/2022 1422 Last data filed at 12/20/2022 1200 Gross per 24 hour  Intake 1975.56 ml  Output 600 ml  Net 1375.56 ml    LBM: Last BM Date : 12/20/22 Baseline Weight: Weight: 65.8 kg Most recent weight: Weight: 64.7 kg     Palliative Assessment/Data:   PPS 40%  Time In:  1320 Time Out:  1420 Time Total:  60  Greater than 50%  of this time was spent counseling and coordinating care related to the above assessment and plan.  Signed by: Rosalin Hawking, MD   Please contact Palliative Medicine Team phone at [phone] for questions and concerns.  For individual provider: See Loretha Stapler

## 2022-12-20 NOTE — Progress Notes (Signed)
[facility] ADULT ICU REPLACEMENT PROTOCOL   The patient does apply for the [facility] Adult ICU Electrolyte Replacment Protocol based on the criteria listed below:   1.Exclusion criteria: TCTS, ECMO, Dialysis, and Myasthenia Gravis patients 2. Is GFR >/= 30 ml/min? Yes.    Patient's GFR today is >60 3. Is SCr </= 2? Yes.   Patient's SCr is 0.40 mg/dL 4. Did SCr increase >/= 0.5 in 24 hours? No. 5.Pt's weight >40kg  Yes.   6. Abnormal electrolyte(s): K+3.2, mag 1.7  7. Electrolytes replaced per protocol 8.  Call MD STAT for K+ </= 2.5, Phos </= 1, or Mag </= 1 Physician:  Dr. Ronnald Ramp, Lilia Argue 12/20/2022 4:57 AM

## 2022-12-20 NOTE — Progress Notes (Addendum)
[facility]    Date of Admission:  12/18/2022   Total days of antibiotics 3          ID: Wayburn Classon is a 89 y.o. male with  acute pe +/- septic pulm emboli. Polymicrobial bacteremia probable from decub ulcer/osteo Principal Problem:   Septic shock (HCC) Active Problems:   Pulmonary embolism (HCC)    Subjective: Afebrile. Leukocytosis improving/. Underwent TTE that showed moderate pericardial effusion. No comments on vegetations; sleeping most of the day  Medications:   Chlorhexidine Gluconate Cloth  6 each Topical Daily    Objective: Vital signs in last 24 hours: Temp:  [97.5 F (36.4 C)-98.3 F (36.8 C)] 97.5 F (36.4 C) (07/14 1230) Pulse Rate:  [78-108] 108 (07/14 1300) Resp:  [12-23] 18 (07/14 1300) BP: (109-185)/(66-136) 109/87 (07/14 1200) SpO2:  [95 %-100 %] 100 % (07/14 1300) Arterial Line BP: (78-145)/(47-82) 145/82 (07/14 1300) Weight:  [64.7 kg] 64.7 kg (07/14 0358) Physical Exam  Constitutional: sleeping. He appears chronically ill and undernourished. No distress.  HENT:  Mouth/Throat: Oropharynx is clear and moist. No oropharyngeal exudate.  Cardiovascular: Normal rate, regular rhythm and normal heart sounds. Exam reveals no gallop and no friction rub.  No murmur heard.  Pulmonary/Chest: Effort normal and breath sounds normal. No respiratory distress. He has no wheezes.  Abdominal: Soft. Bowel sounds are normal. He exhibits no distension. There is no tenderness.  ZOX:WRUEA lower leg wound covered Skin: Skin is warm and dry. No rash noted. No erythema.    Lab Results Recent Labs    12/19/22 0325 12/20/22 0337  WBC 24.0* 16.3*  HGB 8.2* 8.1*  HCT 25.5* 24.7*  NA 139 140  K 3.0* 3.2*  CL 108 110  CO2 22 23  BUN 11 9  CREATININE 0.61 0.40*   Liver Panel Recent Labs    12/18/22 1807 12/19/22 0325  PROT 7.2 5.2*  ALBUMIN 2.2* 1.6*  AST 43* 42*  ALT 28 26  ALKPHOS 73 63  BILITOT 1.0 0.8  BILIDIR  --  0.3*   IBILI  --  0.5   No results found for: "ESRSEDRATE", "POCTSEDRATE" No results found for: "CRP"  Microbiology: 7/14 blood cx pending 7/12 blood cx enterococcus and ecoli Studies/Results: CT FEMUR RIGHT WO CONTRAST  Result Date: 12/19/2022 CLINICAL DATA:  Right leg infection. Evaluate for necrotizing fasciitis. EXAM: CT OF THE LOWER RIGHT EXTREMITY WITHOUT CONTRAST TECHNIQUE: Multidetector CT imaging of the right thigh and lower leg was performed according to the standard protocol. RADIATION DOSE REDUCTION: This exam was performed according to the departmental dose-optimization program which includes automated exposure control, adjustment of the mA and/or kV according to patient size and/or use of iterative reconstruction technique. COMPARISON:  None Available. FINDINGS: Bones/Joint/Cartilage Prior right hip arthroplasty. 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 The distal aspect of the fracture remains nonunited. No new fracture or dislocation. Mild periosteal reaction along the lateral aspect of the mid to distal fibular diaphysis (series 7, image 203). Degenerative changes of the right knee. Symmetric trace to small bilateral knee joint effusions. Ligaments Ligaments are suboptimally evaluated by CT. Muscles and Tendons Grossly intact. Soft tissue Right thigh and lower leg soft tissue swelling. 6.6 cm soft tissue ulcer along the lateral aspect of the mid to distal lower leg with a few tiny foci of subcutaneous emphysema. The ulcer extends close to the outer cortex of the fibular shaft. No fluid collection  or hematoma. No soft tissue mass. Please see same day CT abdomen pelvis report for intrapelvic findings. IMPRESSION: 1. 6.6 cm soft tissue ulcer along the lateral aspect of the mid to distal lower leg extending close to the underlying fibular diaphysis, with mild fibular periosteal reaction concerning for osteomyelitis. 2.  No evidence of necrotizing fasciitis.  No definite abscess. 3. 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 The distal aspect of the fracture remains nonunited. Electronically Signed   By: Obie Dredge M.D.   On: 12/19/2022 12:13   CT TIBIA FIBULA RIGHT WO CONTRAST  Result Date: 12/19/2022 CLINICAL DATA:  Right leg infection. Evaluate for necrotizing fasciitis. EXAM: CT OF THE LOWER RIGHT EXTREMITY WITHOUT CONTRAST TECHNIQUE: Multidetector CT imaging of the right thigh and lower leg was performed according to the standard protocol. RADIATION DOSE REDUCTION: This exam was performed according to the departmental dose-optimization program which includes automated exposure control, adjustment of the mA and/or kV according to patient size and/or use of iterative reconstruction technique. COMPARISON:  None Available. FINDINGS: Bones/Joint/Cartilage Prior right hip arthroplasty. 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 The distal aspect of the fracture remains nonunited. No new fracture or dislocation. Mild periosteal reaction along the lateral aspect of the mid to distal fibular diaphysis (series 7, image 203). Degenerative changes of the right knee. Symmetric trace to small bilateral knee joint effusions. Ligaments Ligaments are suboptimally evaluated by CT. Muscles and Tendons Grossly intact. Soft tissue Right thigh and lower leg soft tissue swelling. 6.6 cm soft tissue ulcer along the lateral aspect of the mid to distal lower leg with a few tiny foci of subcutaneous emphysema. The ulcer extends close to the outer cortex of the fibular shaft. No fluid collection or hematoma. No soft tissue mass. Please see same day CT abdomen pelvis report for intrapelvic findings. IMPRESSION: 1. 6.6 cm soft tissue ulcer along the lateral aspect  of the mid to distal lower leg extending close to the underlying fibular diaphysis, with mild fibular periosteal reaction concerning for osteomyelitis. 2. No evidence of necrotizing fasciitis.  No definite abscess. 3. 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 The distal aspect of the fracture remains nonunited. Electronically Signed   By: Obie Dredge M.D.   On: 12/19/2022 12:13   CT ABDOMEN PELVIS WO CONTRAST  Result Date: 12/19/2022 CLINICAL DATA:  decubitus ulcer, osteomyelitis EXAM: CT ABDOMEN AND PELVIS WITHOUT CONTRAST TECHNIQUE: Multidetector CT imaging of the abdomen and pelvis was performed following the standard protocol without IV contrast. RADIATION DOSE REDUCTION: This exam was performed according to the departmental dose-optimization program which includes automated exposure control, adjustment of the mA and/or kV according to patient size and/or use of iterative reconstruction technique. COMPARISON:  CT chest 12/18/2022. FINDINGS: Lower chest: Trace left pleural effusion with left greater than right bibasilar airspace opacities, slightly progressed. Small-moderate pericardial effusion, unchanged. Hepatobiliary: Two cysts in the right hepatic lobe, largest measuring up to 3.0 cm. Otherwise unremarkable unenhanced appearance of the liver. Layering hyperdense material within the gallbladder is likely related to vicarious contrast excretion. Pancreas: Unremarkable. No pancreatic ductal dilatation or surrounding inflammatory changes. Spleen: Normal in size without focal abnormality. Adrenals/Urinary Tract: Unremarkable adrenal glands. Bilateral renal cysts, which do not require follow-up imaging. There is contrast excretion within the bilateral renal collecting systems and  urinary bladder. No hydronephrosis. Stomach/Bowel: Stomach within normal limits. No dilated loops of bowel to suggest  obstruction. Extensive diverticulosis. No focal bowel wall thickening is evident. A rectal tube is in place. Vascular/Lymphatic: Scattered aortoiliac atherosclerotic calcifications without aneurysm. No abdominopelvic lymphadenopathy. Reproductive: Prostate is unremarkable. Other: No free air or free fluid.  Mild mesenteric edema. Musculoskeletal: Sacral decubitus ulcer overlying the distal sacrum and coccyx. Loss of cortical definition of the posterior cortex of the distal most sacral segment suspicious for osteomyelitis (series 2, image 69). There is soft tissue edema and numerous foci of air within the soft tissues adjacent to the ulceration site. No organized or drainable fluid collections. Ankylosis of the imaged thoracolumbar spine. Large anterior endplate osteophyte at L3-L4. Both SI joints are fused. No acute fractures are identified. Prior right total hip arthroplasty with proximal cerclage wires. IMPRESSION: 1. Sacral decubitus ulcer overlying the distal sacrum and coccyx. Loss of cortical definition of the posterior cortex of the distal-most sacral segment suggestive of osteomyelitis. There is soft tissue edema and numerous foci of air within the soft tissues adjacent to the ulceration site. No organized or drainable fluid collections. 2. Trace left pleural effusion with left greater than right bibasilar airspace opacities, slightly progressed. 3. Small-moderate pericardial effusion, unchanged. 4. Extensive diverticulosis without evidence of acute diverticulitis. 5. 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 6. Aortic atherosclerosis (ICD10-I70.0). Electronically Signed   By: Duanne Guess D.O.   On: 12/19/2022 12:05   ECHOCARDIOGRAM COMPLETE  Result Date: 12/19/2022    ECHOCARDIOGRAM REPORT   Patient Name:   CARDALE WITTKOPP Date of Exam: 12/19/2022 Medical Rec #:  [id number]       Height:       71.0 in Accession #:    [id number]      Weight:       129.4 lb Date of Birth:  06/06/1934        BSA:          1.752 m Patient Age:    88 years        BP:           111/60 mmHg Patient Gender: M               HR:           121 bpm. Exam Location:  Inpatient Procedure: 2D Echo, Cardiac Doppler and Color Doppler Indications:    Pulmonary Embolus  History:        Patient has no prior history of Echocardiogram examinations.                 Arrythmias:Tachycardia, Signs/Symptoms:cough; Risk                 Factors:sepsis, LE ulcers.  Sonographer:    Wallie Char Referring Phys: [id number] ROBERT S BYRUM  Sonographer Comments: Technically challenging study due to limited acoustic windows. IMPRESSIONS  1. Left ventricular ejection fraction, by estimation, is 55 to 60%. The left ventricle has normal function. The left ventricle has no regional wall motion abnormalities. Left ventricular diastolic parameters are consistent with Grade I diastolic dysfunction (impaired relaxation).  2. Right ventricular systolic function is normal. The right ventricular size is normal. Tricuspid regurgitation signal is inadequate for assessing PA pressure.  3. Moderate pericardial effusion. The pericardial effusion is anterior to the right ventricle.  4. The mitral valve is normal in structure. No evidence of mitral valve regurgitation. No evidence of mitral stenosis.  5. The aortic valve is  tricuspid. There is mild calcification of the aortic valve. Aortic valve regurgitation is not visualized. Aortic valve sclerosis/calcification is present, without any evidence of aortic stenosis.  6. The inferior vena cava is normal in size with greater than 50% respiratory variability, suggesting right atrial pressure of 3 mmHg. Conclusion(s)/Recommendation(s): There is a moderate-sized anterior pericardial effusion without evidence of overt tamponade. there is no RV strain. FINDINGS  Left Ventricle: Left ventricular ejection fraction, by estimation, is 55 to 60%. The left ventricle has normal function. The left ventricle has no regional wall motion  abnormalities. The left ventricular internal cavity size was normal in size. There is  no left ventricular hypertrophy. Left ventricular diastolic parameters are consistent with Grade I diastolic dysfunction (impaired relaxation). Right Ventricle: The right ventricular size is normal. No increase in right ventricular wall thickness. Right ventricular systolic function is normal. Tricuspid regurgitation signal is inadequate for assessing PA pressure. Left Atrium: Left atrial size was normal in size. Right Atrium: Right atrial size was normal in size. Pericardium: A moderately sized pericardial effusion is present. The pericardial effusion is anterior to the right ventricle. Mitral Valve: The mitral valve is normal in structure. No evidence of mitral valve regurgitation. No evidence of mitral valve stenosis. MV peak gradient, 1.9 mmHg. The mean mitral valve gradient is 1.0 mmHg. Tricuspid Valve: The tricuspid valve is normal in structure. Tricuspid valve regurgitation is trivial. No evidence of tricuspid stenosis. Aortic Valve: The aortic valve is tricuspid. There is mild calcification of the aortic valve. Aortic valve regurgitation is not visualized. Aortic valve sclerosis/calcification is present, without any evidence of aortic stenosis. Aortic valve mean gradient measures 2.0 mmHg. Aortic valve peak gradient measures 2.4 mmHg. Aortic valve area, by VTI measures 2.81 cm. Pulmonic Valve: The pulmonic valve was normal in structure. Pulmonic valve regurgitation is not visualized. No evidence of pulmonic stenosis. Aorta: The aortic root is normal in size and structure. Venous: The inferior vena cava is normal in size with greater than 50% respiratory variability, suggesting right atrial pressure of 3 mmHg. IAS/Shunts: No atrial level shunt detected by color flow Doppler.  LEFT VENTRICLE PLAX 2D LVIDd:         4.60 cm     Diastology LVIDs:         3.40 cm     LV e' medial:    6.73 cm/s LV PW:         1.00 cm     LV E/e'  medial:  9.6 LV IVS:        0.70 cm     LV e' lateral:   7.50 cm/s LVOT diam:     2.30 cm     LV E/e' lateral: 8.6 LV SV:         50 LV SV Index:   28 LVOT Area:     4.15 cm  LV Volumes (MOD) LV vol d, MOD A2C: 45.0 ml LV vol d, MOD A4C: 58.9 ml LV vol s, MOD A2C: 20.3 ml LV vol s, MOD A4C: 27.6 ml LV SV MOD A2C:     24.7 ml LV SV MOD A4C:     58.9 ml LV SV MOD BP:      29.1 ml RIGHT VENTRICLE             IVC RV Basal diam:  3.80 cm     IVC diam: 1.60 cm RV S prime:     26.10 cm/s TAPSE (M-mode): 2.0 cm LEFT ATRIUM  Index        RIGHT ATRIUM           Index LA diam:      3.20 cm 1.83 cm/m   RA Area:     14.70 cm LA Vol (A4C): 20.7 ml 11.81 ml/m  RA Volume:   36.40 ml  20.77 ml/m  AORTIC VALVE AV Area (Vmax):    2.83 cm AV Area (Vmean):   2.48 cm AV Area (VTI):     2.81 cm AV Vmax:           78.00 cm/s AV Vmean:          58.600 cm/s AV VTI:            0.177 m AV Peak Grad:      2.4 mmHg AV Mean Grad:      2.0 mmHg LVOT Vmax:         53.20 cm/s LVOT Vmean:        35.000 cm/s LVOT VTI:          0.120 m LVOT/AV VTI ratio: 0.68  AORTA Ao Root diam: 3.60 cm MITRAL VALVE               TRICUSPID VALVE MV Area (PHT): 3.39 cm    TR Peak grad:   27.0 mmHg MV Area VTI:   3.30 cm    TR Vmax:        260.00 cm/s MV Peak grad:  1.9 mmHg MV Mean grad:  1.0 mmHg    SHUNTS MV Vmax:       0.68 m/s    Systemic VTI:  0.12 m MV Vmean:      44.6 cm/s   Systemic Diam: 2.30 cm MV Decel Time: 224 msec MV E velocity: 64.60 cm/s MV A velocity: 66.80 cm/s MV E/A ratio:  0.97 Arvilla Meres MD Electronically signed by Arvilla Meres MD Signature Date/Time: 12/19/2022/11:29:18 AM    Final    CT Angio Chest PE W and/or Wo Contrast  Result Date: 12/18/2022 CLINICAL DATA:  Pulmonary embolism suspected. High probability. Shortness of breath and cough. EXAM: CT ANGIOGRAPHY CHEST WITH CONTRAST TECHNIQUE: Multidetector CT imaging of the chest was performed using the standard protocol during bolus administration of intravenous  contrast. Multiplanar CT image reconstructions and MIPs were obtained to evaluate the vascular anatomy. RADIATION DOSE REDUCTION: This exam was performed according to the departmental dose-optimization program which includes automated exposure control, adjustment of the mA and/or kV according to patient size and/or use of iterative reconstruction technique. CONTRAST:  75mL OMNIPAQUE IOHEXOL 350 MG/ML SOLN COMPARISON:  Portable chest today portable chest 11/29/2022. No prior cross-sectional imaging for comparison. FINDINGS: Cardiovascular: The pulmonary trunk is prominent measuring 3.3 cm, and there is also elevated RV/LV ratio of 1.2 consistent with right heart dysfunction/strain. There are no central arterial clots, no large emboli. There is a small thrombus in a posterior basal right lower lobe segmental artery on 4: 97-100. On the left there are subsegmental lingular emboli on 4: 83-84 and 4:94-100. In the left lower lobe there is a lateral basal subsegmental embolus on 4: 100-104 with extension into the downstream small arteries in this distribution. No further emboli are seen. The heart is mildly enlarged. Pericardial effusion noted circumferentially up to maximum 2 cm in thickness to the right. The pulmonary veins are normal caliber. The great vessels are clear and branch normally. There is mild aortic atherosclerosis and tortuosity, borderline dilatation of the aortic root at the sinuses  of Valsalva measuring 3.9 cm, and a slightly dilated ascending aorta measuring 4.2 cm. There is no aortic or great vessel dissection or stenosis. There are no visible coronary calcific plaques. There are small air pockets in both internal jugular veins, most likely either injected at the time of contrast injection or with placement of the patient's IV. There is no central air embolus. Mediastinum/Nodes: There is a heterogeneous 1 cm nodule in the left lobe of the thyroid gland.No follow-up imaging is recommended. Reference: J  Am Coll Radiol. 2015 Feb;12(2): 143-50. There is no supraclavicular, axillary or intrathoracic adenopathy. Thoracic esophagus, both main bronchi and thoracic trachea are unremarkable. Lungs/Pleura: Minimal centrilobular and paraseptal emphysematous change both lung apices. Diffuse bronchial thickening. Scattered subsegmental bronchial impactions are noted in the posterior basal left lower lobe with patchy consolidation which is most likely due to bronchopneumonia although component of infarct is possible. There is mild posterior atelectasis in the right lower lobe. No bronchial impactions are seen on the right. The lungs are otherwise clear. There is a small posteromedial right diaphragmatic fat herniation into the lower chest. There are trace pleural effusions. No pneumothorax or pleural thickening. Upper Abdomen: No acute radiographic findings. Streak artifact limits fine detail due to the patient's arms in the field. There is a 3.2 cm simple appearing cyst in the superior pole of the right kidney with a Hounsfield density of 11.4. No follow-up imaging is recommended. In hepatic segment 5 there is an indeterminate hypodense lesion measuring 3.2 cm with appearance of vessels at its periphery, possibly a hemangioma but further evaluation is recommended. Musculoskeletal: There is multilevel bridging enthesopathy of the thoracic spine. No spinal compression fracture or other acute or significant osseous findings. The ribcage is intact. No chest wall mass. Review of the MIP images confirms the above findings. IMPRESSION: 1. Positive for acute PE with CT findings of right heart strain (RV/LV Ratio = 1.2 ) consistent with at least submassive (intermediate risk) PE, although the visible clot burden is small. The presence of right heart strain has been associated with an increased risk of morbidity and mortality. Please refer to the "Code PE Focused" order set in EPIC. 2. Cardiomegaly with pericardial effusion. 3. Aortic  atherosclerosis with borderline dilatation of the aortic root at the sinuses of Valsalva, and slightly dilated ascending aorta at 4.2 cm. No dissection or stenosis. Recommend annual imaging followup by CTA or MRA. This recommendation follows 2010 ACCF/AHA/AATS/ACR/ASA/SCA/SCAI/SIR/STS/SVM Guidelines for the Diagnosis and Management of Patients with Thoracic Aortic Disease. Circulation. 2010; 121: N829-F621. Aortic aneurysm NOS (ICD10-I71.9) 4. Bronchitis with scattered subsegmental bronchial impactions or aspiration in the posterior basal left lower lobe, with patchy consolidation which is most likely due to bronchopneumonia although component of infarct is possible. Follow-up study recommended after treatment to ensure clearing. 5. Trace pleural effusions. 6. Indeterminate 3.2 cm hypodense lesion in hepatic segment 5. Follow-up nonemergent MRI or CT without and with contrast recommended. 7. Small air pockets in both internal jugular veins, most likely either injected at the time of contrast injection or with placement of the patient's IV. 8. Emphysema. 9. Critical Value/emergent results were called by telephone at the time of interpretation on 12/18/2022 at 10:23 pm to provider [facility] , who verbally acknowledged these results. Aortic Atherosclerosis (ICD10-I70.0) and Emphysema (ICD10-J43.9). Electronically Signed   By: Almira Bar M.D.   On: 12/18/2022 22:32   DG Chest Port 1 View  Result Date: 12/18/2022 CLINICAL DATA:  Shortness of breath and cough EXAM: PORTABLE CHEST  1 VIEW COMPARISON:  Radiograph 11/29/2022 FINDINGS: Stable cardiomediastinal silhouette. No focal consolidation, pleural effusion, or pneumothorax. No displaced rib fractures. Advanced arthritis both shoulders. IMPRESSION: No active disease. Electronically Signed   By: Minerva Fester M.D.   On: 12/18/2022 18:02     Assessment/Plan: Polymicrobial bacteremia (e.faecalis and ecoli ) = continue on amp/ceftriaxone. 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  Sacral/tibia osteo/decub = addn metronidazole added. Would recommend to get wound care consult.   Hypokalemia = primary team to replete.  Prognosis - patient is quite debilitated. Discussion of goals of care are underway.  Dr Thedore Mins to see tomorrow to provider final recs.  I have personally spent 35 minutes involved in face-to-face and non-face-to-face activities for this patient on the day of the visit. Professional time spent includes the following activities: Preparing to see the patient (review of tests), Obtaining and/or reviewing separately obtained history (admission/discharge record), Performing a medically appropriate examination and/or evaluation , Ordering medications/tests/procedures, referring and communicating with other health care professionals, Documenting clinical information in the EMR, Independently interpreting results (not separately reported), Communicating results to the patient/family/caregiver, Counseling and educating the patient/family/caregiver and Care coordination (not separately reported).     [facility] Pager: [phone]  12/20/2022, 3:43 PM

## 2022-12-20 NOTE — Progress Notes (Addendum)
PROGRESS NOTE    Edwin [name]  ZOX:[id number] DOB: [birth year] DOA: 12/18/2022 PCP: [name], DO  Chief Complaint  Patient presents with   Shortness of Breath   Cough    Brief Narrative:   89 year old man with history of dementia, right hip fracture/hemiarthroplasty 05/2022, bedridden and nonverbal at baseline. Family is able to assist him into Natori Gudino wheelchair intermittently and he has home health assistance. He has reportedly had ongoing cough for Billyjack Trompeter few weeks. There was concern that he was worsening and EMS was called. In the emergency department he has been relatively hypotensive and tachycardic. Noted to have Cloie Wooden large sacral decubitus ulcer, right lower extremity decubitus ulcer with significant breakdown and malodorous drainage. CT-PA chest identified scattered subsegmental bronchial impaction posterior left lower lobe, to Shalita Notte lesser degree posterior right lower lobe as well as small bilateral segmental and subsegmental pulmonary embolism. RV/LV ratio 1.2 suggesting right heart strain. He has received vancomycin and cefepime, started on heparin infusion, volume resuscitation. Lactic acid slightly elevated 3.5, troponin 59, WBC 3.9 with Shayanne Gomm left shift.   Assessment & Plan:   Principal Problem:   Sepsis (HCC) Active Problems:   Pulmonary embolism (HCC)  Goals of care Palliative has been consulted, will continue discussions with family Addendum: wife says she doesn't want him intubated or have mechanical ventilation.  I tried to expand on code status discussion.  For someone who is DNI, I think DNR makes sense as well and I think that'd be appropriate to Mr. Aurich.  Mrs. Ederer noted the discussion was making her upset and didn't want to discuss this anymore today.  Will leave things as they are and continue discussions.  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  Prior hip arthroplasty with chronic periprosthetic fracture.  Sacral decubitus ulcer overlying the sacrum and coccyx, loss of cortical definition of posterior cortex of distal most sacral segment suggestive of osteomyelitis Cultures growing gram negative rods, gram positive cocci (BCID with e. Coli) Appreciate ID recs -> concern for endocarditis in setting of polymicrobial bacteremia with bilateral PE TTE with normal MV, aortic valve sclerosis, tricuspid valve normal in structure, pulmonic valve normal in structure Will plan to treat empirically for endocarditis/osteomyelitis (defer TEE) Continue abx with ampicillin, ceftriaxone, flagyl  Repeat blood cultures today Wound care c/s  Acute Pulmonary Embolism  Heparin gtt Echo with normal RV size, EF 55-60%, moderate sized anterior pericardial effusion, no RV strain  LE [place]  Pericardial Effusion No evidence tamponade  Trace Pleural Effusions Consider repeat imaging in [place] few days  Dementia At baseline some mumbling speech, sounds like he will speak intelligibly at times Delirium precautions  Acute Metabolic Encephalopathy Seems altered in setting of infection Appreciate additional information from family Delirium precautions NPO for now until he can participate in swallow eval  Right Radial Art line Can likely come out soon, will discuss with RN  Fecal Management System Continue to monitor output      DVT prophylaxis: heparin gtt Code Status: full Family Communication: daughter (called wife, no answer) Disposition:   Status is: Inpatient Remains inpatient appropriate because: continued need for inpatient care   Consultants:  Pccm ID  Procedures:  Echo IMPRESSIONS     1.  Left ventricular ejection fraction, by estimation, is  55 to 60%. The  left ventricle has normal function. The left ventricle has no regional  wall motion abnormalities. Left ventricular diastolic parameters are  consistent with Grade I diastolic  dysfunction (impaired relaxation).   2. Right ventricular systolic function is normal. The right ventricular  size is normal. Tricuspid regurgitation signal is inadequate for assessing  PA pressure.   3. Moderate pericardial effusion. The pericardial effusion is anterior to  the right ventricle.   4. The mitral valve is normal in structure. No evidence of mitral valve  regurgitation. No evidence of mitral stenosis.   5. The aortic valve is tricuspid. There is mild calcification of the  aortic valve. Aortic valve regurgitation is not visualized. Aortic valve  sclerosis/calcification is present, without any evidence of aortic  stenosis.   6. The inferior vena cava is normal in size with greater than 50%  respiratory variability, suggesting right atrial pressure of 3 mmHg.   Conclusion(s)/Recommendation(s): There is Aprill Banko moderate-sized anterior  pericardial effusion without evidence of overt tamponade. there is no RV  strain.   Antimicrobials:  Anti-infectives (From admission, onward)    Start     Dose/Rate Route Frequency Ordered Stop   12/20/22 0000  vancomycin (VANCOREADY) IVPB 1250 mg/250 mL  Status:  Discontinued        1,250 mg 166.7 mL/hr over 90 Minutes Intravenous Every 24 hours 12/19/22 0034 12/19/22 0920   12/19/22 2200  metroNIDAZOLE (FLAGYL) IVPB 500 mg        500 mg 100 mL/hr over 60 Minutes Intravenous 2 times daily 12/19/22 1715     12/19/22 2000  ampicillin (OMNIPEN) 2 g in sodium chloride 0.9 % 100 mL IVPB        2 g 300 mL/hr over 20 Minutes Intravenous Every 4 hours 12/19/22 1715     12/19/22 1815  cefTRIAXone (ROCEPHIN) 2 g in sodium chloride 0.9 % 100 mL IVPB        2 g 200 mL/hr over 30 Minutes Intravenous 2 times daily 12/19/22 1715     12/19/22 1030   piperacillin-tazobactam (ZOSYN) IVPB 3.375 g  Status:  Discontinued        3.375 g 12.5 mL/hr over 240 Minutes Intravenous Every 8 hours 12/19/22 1015 12/19/22 1715   12/19/22 0800  ceFEPIme (MAXIPIME) 2 g in sodium chloride 0.9 % 100 mL IVPB  Status:  Discontinued        2 g 200 mL/hr over 30 Minutes Intravenous Every 8 hours 12/19/22 0034 12/19/22 0920   12/18/22 2230  ceFEPIme (MAXIPIME) 2 g in sodium chloride 0.9 % 100 mL IVPB        2 g 200 mL/hr over 30 Minutes Intravenous  Once 12/18/22 2222 12/18/22 2321   12/18/22 2230  metroNIDAZOLE (FLAGYL) IVPB 500 mg        500 mg 100 mL/hr over 60 Minutes Intravenous  Once 12/18/22 2222 12/19/22 0010   12/18/22 2230  vancomycin (VANCOCIN) IVPB 1000 mg/200 mL premix        1,000 mg 200 mL/hr over 60 Minutes Intravenous  Once 12/18/22 2222 12/19/22 0122       Subjective: Nonverbal, moans  Objective: Vitals:   12/20/22 0600 12/20/22 0700 12/20/22 0758 12/20/22 0800  BP:      Pulse: (!) 106 78  80  Resp: 17 19  16   Temp:   (!) 97.5 F (36.4 C)   TempSrc:   Axillary   SpO2: 100%  99%  97%  Weight:      Height:        Intake/Output Summary (Last 24 hours) at 12/20/2022 0829 Last data filed at 12/20/2022 0800 Gross per 24 hour  Intake 1731.65 ml  Output 600 ml  Net 1131.65 ml   Filed Weights   12/18/22 1840 12/19/22 0115 12/20/22 0358  Weight: 65.8 kg 58.7 kg 64.7 kg    Examination:  General exam: Appears calm and comfortable  Respiratory system: unlabored Cardiovascular system: RRR Gastrointestinal system: Abdomen is nondistended, soft and nontender.  Central nervous system: moans, not able to examine eyes as he squeezes them shut Extremities: RLE thigh edema Skin: wounds not examined today, images reviewed   Data Reviewed: I have personally reviewed following labs and imaging studies  CBC: Recent Labs  Lab 12/18/22 1807 12/19/22 0325 12/20/22 0337  WBC 9.1 24.0* 16.3*  NEUTROABS 8.4*  --   --   HGB 9.4* 8.2*  8.1*  HCT 29.2* 25.5* 24.7*  MCV 74.9* 76.8* 72.0*  PLT 153 129* 140*    Basic Metabolic Panel: Recent Labs  Lab 12/18/22 1807 12/19/22 0325 12/20/22 0337  NA 137 139 140  K 3.1* 3.0* 3.2*  CL 104 108 110  CO2 24 22 23   GLUCOSE 111* 98 83  BUN 11 11 9   CREATININE 0.81 0.61 0.40*  CALCIUM 9.5 8.9 8.2*  MG  --  2.1 1.7  PHOS  --  1.6* 2.8    GFR: Estimated Creatinine Clearance: 60.7 mL/min (Babak Lucus) (by C-G formula based on SCr of 0.4 mg/dL (L)).  Liver Function Tests: Recent Labs  Lab 12/18/22 1807 12/19/22 0325  AST 43* 42*  ALT 28 26  ALKPHOS 73 63  BILITOT 1.0 0.8  PROT 7.2 5.2*  ALBUMIN 2.2* 1.6*    CBG: No results for input(s): "GLUCAP" in the last 168 hours.   Recent Results (from the past 240 hour(s))  Blood Culture (routine x 2)     Status: None (Preliminary result)   Collection Time: 12/18/22  6:18 PM   Specimen: BLOOD RIGHT WRIST  Result Value Ref Range Status   Specimen Description   Final    BLOOD RIGHT WRIST Performed at [facility], [address] [place], [place] [address]    Special Requests   Final    BOTTLES DRAWN AEROBIC AND ANAEROBIC Blood Culture adequate volume Performed at [facility], [address] [place], [place] [address]    Culture  Setup Time   Final    GRAM NEGATIVE RODS GRAM POSITIVE COCCI IN BOTH AEROBIC AND ANAEROBIC BOTTLES CRITICAL RESULT CALLED TO, READ BACK BY AND VERIFIED WITH: Nathanial Millman [facility] [id number] 1232 BY Berline Chough, MT Performed at [facility], [address], [place], [place] [address]    Culture Romie Minus NEGATIVE RODS GRAM POSITIVE COCCI   Final   Report Status PENDING  Incomplete  Blood Culture (routine x 2)     Status: None (Preliminary result)   Collection Time: 12/18/22  6:18 PM   Specimen: BLOOD  Result Value Ref Range Status   Specimen Description   Final    BLOOD RIGHT ANTECUBITAL Performed at [facility], [address] [place], [place]  [address]    Special Requests   Final    BOTTLES DRAWN AEROBIC AND ANAEROBIC Blood Culture adequate volume Performed at [facility], [address], [place], [place] [address]    Culture  Setup Time  Final    GRAM NEGATIVE RODS ANAEROBIC BOTTLE ONLY GRAM POSITIVE COCCI AND GNR AEROBIC BOTTLE ONLY CRITICAL VALUE NOTED.  VALUE IS CONSISTENT WITH PREVIOUSLY REPORTED AND CALLED VALUE. Performed at [facility], [address] [place], [place] [address]    Culture Romie Minus NEGATIVE RODS GRAM POSITIVE COCCI   Final   Report Status PENDING  Incomplete  Blood Culture ID Panel (Reflexed)     Status: Abnormal   Collection Time: 12/18/22  6:18 PM  Result Value Ref Range Status   Enterococcus faecalis DETECTED (Chayla Shands) NOT DETECTED Final    Comment: CRITICAL RESULT CALLED TO, READ BACK BY AND VERIFIED WITH: PHARMD DREW WOFFORD [id number] 1232 BY J RAZZAK, MT    Enterococcus Faecium NOT DETECTED NOT DETECTED Final   Listeria monocytogenes NOT DETECTED NOT DETECTED Final   Staphylococcus species NOT DETECTED NOT DETECTED Final   Staphylococcus aureus (BCID) NOT DETECTED NOT DETECTED Final   Staphylococcus epidermidis NOT DETECTED NOT DETECTED Final   Staphylococcus lugdunensis NOT DETECTED NOT DETECTED Final   Streptococcus species NOT DETECTED NOT DETECTED Final   Streptococcus agalactiae NOT DETECTED NOT DETECTED Final   Streptococcus pneumoniae NOT DETECTED NOT DETECTED Final   Streptococcus pyogenes NOT DETECTED NOT DETECTED Final   Juanelle Trueheart.calcoaceticus-baumannii NOT DETECTED NOT DETECTED Final   Bacteroides fragilis NOT DETECTED NOT DETECTED Final   Enterobacterales DETECTED (Reed Dady) NOT DETECTED Final    Comment: Enterobacterales represent Shaunta Oncale large order of gram negative bacteria, not Derra Shartzer single organism. CRITICAL RESULT CALLED TO, READ BACK BY AND VERIFIED WITH: PHARMD DREW WOFFORD [id number] 1232 BY J RAZZAK, MT    Enterobacter cloacae complex NOT DETECTED NOT DETECTED Final    Escherichia coli DETECTED (Ivie Savitt) NOT DETECTED Final    Comment: CRITICAL RESULT CALLED TO, READ BACK BY AND VERIFIED WITH: PHARMD DREW WOFFORD [id number] 1232 BY J RAZZAK, MT    Klebsiella aerogenes NOT DETECTED NOT DETECTED Final   Klebsiella oxytoca NOT DETECTED NOT DETECTED Final   Klebsiella pneumoniae NOT DETECTED NOT DETECTED Final   Proteus species NOT DETECTED NOT DETECTED Final   Salmonella species NOT DETECTED NOT DETECTED Final   Serratia marcescens NOT DETECTED NOT DETECTED Final   Haemophilus influenzae NOT DETECTED NOT DETECTED Final   Neisseria meningitidis NOT DETECTED NOT DETECTED Final   Pseudomonas aeruginosa NOT DETECTED NOT DETECTED Final   Stenotrophomonas maltophilia NOT DETECTED NOT DETECTED Final   Candida albicans NOT DETECTED NOT DETECTED Final   Candida auris NOT DETECTED NOT DETECTED Final   Candida glabrata NOT DETECTED NOT DETECTED Final   Candida krusei NOT DETECTED NOT DETECTED Final   Candida parapsilosis NOT DETECTED NOT DETECTED Final   Candida tropicalis NOT DETECTED NOT DETECTED Final   Cryptococcus neoformans/gattii NOT DETECTED NOT DETECTED Final   CTX-M ESBL NOT DETECTED NOT DETECTED Final   Carbapenem resistance IMP NOT DETECTED NOT DETECTED Final   Carbapenem resistance KPC NOT DETECTED NOT DETECTED Final   Carbapenem resistance NDM NOT DETECTED NOT DETECTED Final   Carbapenem resist OXA 48 LIKE NOT DETECTED NOT DETECTED Final   Vancomycin resistance NOT DETECTED NOT DETECTED Final   Carbapenem resistance VIM NOT DETECTED NOT DETECTED Final    Comment: Performed at [facility], [address] [place], [place] [address]  MRSA Next Gen by PCR, Nasal     Status: None   Collection Time: 12/19/22  1:02 AM   Specimen: Nasal Mucosa; Nasal Swab  Result Value Ref Range Status   MRSA by PCR Next  Gen NOT DETECTED NOT DETECTED Final    Comment: (NOTE) The GeneXpert MRSA Assay (FDA approved for NASAL specimens only), is one component of [place] Delsignore  comprehensive MRSA colonization surveillance program. It is not intended to diagnose MRSA infection nor to guide or monitor treatment for MRSA infections. Test performance is not FDA approved in patients less than 43 years old. Performed at [facility], [address], [place], [place] [address]   C Difficile Quick Screen w PCR reflex     Status: None   Collection Time: 12/19/22  2:16 PM   Specimen: STOOL  Result Value Ref Range Status   C Diff antigen NEGATIVE NEGATIVE Final   C Diff toxin NEGATIVE NEGATIVE Final   C Diff interpretation No C. difficile detected.  Final    Comment: Performed at [facility], [address] [place], [place] [address]         Radiology Studies: CT FEMUR RIGHT WO CONTRAST  Result Date: 12/19/2022 CLINICAL DATA:  Right leg infection. Evaluate for necrotizing fasciitis. EXAM: CT OF THE LOWER RIGHT EXTREMITY WITHOUT CONTRAST TECHNIQUE: Multidetector CT imaging of the right thigh and lower leg was performed according to the standard protocol. RADIATION DOSE REDUCTION: This exam was performed according to the departmental dose-optimization program which includes automated exposure control, adjustment of the mA and/or kV according to patient size and/or use of iterative reconstruction technique. COMPARISON:  None Available. FINDINGS: Bones/Joint/Cartilage Prior right hip arthroplasty. 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 The distal aspect of the fracture remains nonunited. No new fracture or dislocation. Mild periosteal reaction along the lateral aspect of the mid to distal fibular diaphysis (series 7, image 203). Degenerative changes of the right knee. Symmetric trace to small bilateral knee joint effusions. Ligaments Ligaments are suboptimally evaluated by CT. Muscles and Tendons Grossly intact. Soft tissue Right thigh and lower leg soft tissue swelling. 6.6  cm soft tissue ulcer along the lateral aspect of the mid to distal lower leg with Takoya Jonas few tiny foci of subcutaneous emphysema. The ulcer extends close to the outer cortex of the fibular shaft. No fluid collection or hematoma. No soft tissue mass. Please see same day CT abdomen pelvis report for intrapelvic findings. IMPRESSION: 1. 6.6 cm soft tissue ulcer along the lateral aspect of the mid to distal lower leg extending close to the underlying fibular diaphysis, with mild fibular periosteal reaction concerning for osteomyelitis. 2. No evidence of necrotizing fasciitis.  No definite abscess. 3. 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 The distal aspect of the fracture remains nonunited. Electronically Signed   By: Obie Dredge M.D.   On: 12/19/2022 12:13   CT TIBIA FIBULA RIGHT WO CONTRAST  Result Date: 12/19/2022 CLINICAL DATA:  Right leg infection. Evaluate for necrotizing fasciitis. EXAM: CT OF THE LOWER RIGHT EXTREMITY WITHOUT CONTRAST TECHNIQUE: Multidetector CT imaging of the right thigh and lower leg was performed according to the standard protocol. RADIATION DOSE REDUCTION: This exam was performed according to the departmental dose-optimization program which includes automated exposure control, adjustment of the mA and/or kV according to patient size and/or use of iterative reconstruction technique. COMPARISON:  None Available. FINDINGS: Bones/Joint/Cartilage Prior right hip arthroplasty. 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 The distal aspect of the fracture remains nonunited. No new fracture or dislocation. Mild periosteal  reaction along the lateral aspect of the mid to distal fibular diaphysis (series 7, image 203). Degenerative changes of the right knee. Symmetric trace to small bilateral knee joint  effusions. Ligaments Ligaments are suboptimally evaluated by CT. Muscles and Tendons Grossly intact. Soft tissue Right thigh and lower leg soft tissue swelling. 6.6 cm soft tissue ulcer along the lateral aspect of the mid to distal lower leg with Navreet Bolda few tiny foci of subcutaneous emphysema. The ulcer extends close to the outer cortex of the fibular shaft. No fluid collection or hematoma. No soft tissue mass. Please see same day CT abdomen pelvis report for intrapelvic findings. IMPRESSION: 1. 6.6 cm soft tissue ulcer along the lateral aspect of the mid to distal lower leg extending close to the underlying fibular diaphysis, with mild fibular periosteal reaction concerning for osteomyelitis. 2. No evidence of necrotizing fasciitis.  No definite abscess. 3. 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 The distal aspect of the fracture remains nonunited. Electronically Signed   By: Obie Dredge M.D.   On: 12/19/2022 12:13   CT ABDOMEN PELVIS WO CONTRAST  Result Date: 12/19/2022 CLINICAL DATA:  decubitus ulcer, osteomyelitis EXAM: CT ABDOMEN AND PELVIS WITHOUT CONTRAST TECHNIQUE: Multidetector CT imaging of the abdomen and pelvis was performed following the standard protocol without IV contrast. RADIATION DOSE REDUCTION: This exam was performed according to the departmental dose-optimization program which includes automated exposure control, adjustment of the mA and/or kV according to patient size and/or use of iterative reconstruction technique. COMPARISON:  CT chest 12/18/2022. FINDINGS: Lower chest: Trace left pleural effusion with left greater than right bibasilar airspace opacities, slightly progressed. Small-moderate pericardial effusion, unchanged. Hepatobiliary: Two cysts in the right hepatic lobe, largest measuring up to 3.0 cm. Otherwise unremarkable unenhanced appearance of the liver. Layering  hyperdense material within the gallbladder is likely related to vicarious contrast excretion. Pancreas: Unremarkable. No pancreatic ductal dilatation or surrounding inflammatory changes. Spleen: Normal in size without focal abnormality. Adrenals/Urinary Tract: Unremarkable adrenal glands. Bilateral renal cysts, which do not require follow-up imaging. There is contrast excretion within the bilateral renal collecting systems and urinary bladder. No hydronephrosis. Stomach/Bowel: Stomach within normal limits. No dilated loops of bowel to suggest obstruction. Extensive diverticulosis. No focal bowel wall thickening is evident. Kainat Pizana rectal tube is in place. Vascular/Lymphatic: Scattered aortoiliac atherosclerotic calcifications without aneurysm. No abdominopelvic lymphadenopathy. Reproductive: Prostate is unremarkable. Other: No free air or free fluid.  Mild mesenteric edema. Musculoskeletal: Sacral decubitus ulcer overlying the distal sacrum and coccyx. Loss of cortical definition of the posterior cortex of the distal most sacral segment suspicious for osteomyelitis (series 2, image 69). There is soft tissue edema and numerous foci of air within the soft tissues adjacent to the ulceration site. No organized or drainable fluid collections. Ankylosis of the imaged thoracolumbar spine. Large anterior endplate osteophyte at L3-L4. Both SI joints are fused. No acute fractures are identified. Prior right total hip arthroplasty with proximal cerclage wires. IMPRESSION: 1. Sacral decubitus ulcer overlying the distal sacrum and coccyx. Loss of cortical definition of the posterior cortex of the distal-most sacral segment suggestive of osteomyelitis. There is soft tissue edema and numerous foci of air within the soft tissues adjacent to the ulceration site. No organized or drainable fluid collections. 2. Trace left pleural effusion with left greater than right bibasilar airspace opacities, slightly progressed. 3. Small-moderate  pericardial effusion, unchanged. 4. Extensive diverticulosis without evidence of acute diverticulitis. 5.  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 6. Aortic atherosclerosis (ICD10-I70.0). Electronically Signed   By: Duanne Guess D.O.   On: 12/19/2022 12:05   ECHOCARDIOGRAM COMPLETE  Result Date: 12/19/2022    ECHOCARDIOGRAM REPORT   Patient Name:   Edwin [name] Date of Exam: 12/19/2022 Medical Rec #:  [id number]       Height:       71.0 in Accession #:    [id number]      Weight:       129.4 lb Date of Birth:  08-14-1933       BSA:          1.752 m Patient Age:    88 years        BP:           111/60 mmHg Patient Gender: M               HR:           121 bpm. Exam Location:  Inpatient Procedure: 2D Echo, Cardiac Doppler and Color Doppler Indications:    Pulmonary Embolus  History:        Patient has no prior history of Echocardiogram examinations.                 Arrythmias:Tachycardia, Signs/Symptoms:cough; Risk                 Factors:sepsis, LE ulcers.  Sonographer:    Wallie Char Referring Phys: 45 ROBERT S BYRUM  Sonographer Comments: Technically challenging study due to limited acoustic windows. IMPRESSIONS  1. Left ventricular ejection fraction, by estimation, is 55 to 60%. The left ventricle has normal function. The left ventricle has no regional wall motion abnormalities. Left ventricular diastolic parameters are consistent with Grade I diastolic dysfunction (impaired relaxation).  2. Right ventricular systolic function is normal. The right ventricular size is normal. Tricuspid regurgitation signal is inadequate for assessing PA pressure.  3. Moderate pericardial effusion. The pericardial effusion is anterior to the right ventricle.  4. The mitral valve is normal in structure. No evidence of mitral valve regurgitation. No evidence of mitral stenosis.  5. The aortic valve is tricuspid. There is mild calcification of the aortic valve. Aortic valve regurgitation is not  visualized. Aortic valve sclerosis/calcification is present, without any evidence of aortic stenosis.  6. The inferior vena cava is normal in size with greater than 50% respiratory variability, suggesting right atrial pressure of 3 mmHg. Conclusion(s)/Recommendation(s): There is Jermarion Poffenberger moderate-sized anterior pericardial effusion without evidence of overt tamponade. there is no RV strain. FINDINGS  Left Ventricle: Left ventricular ejection fraction, by estimation, is 55 to 60%. The left ventricle has normal function. The left ventricle has no regional wall motion abnormalities. The left ventricular internal cavity size was normal in size. There is  no left ventricular hypertrophy. Left ventricular diastolic parameters are consistent with Grade I diastolic dysfunction (impaired relaxation). Right Ventricle: The right ventricular size is normal. No increase in right ventricular wall thickness. Right ventricular systolic function is normal. Tricuspid regurgitation signal is inadequate for assessing PA pressure. Left Atrium: Left atrial size was normal in size. Right Atrium: Right atrial size was normal in size. Pericardium: Shardae Kleinman moderately sized pericardial effusion is present. The pericardial effusion is anterior to the right ventricle. Mitral Valve: The mitral valve is normal in structure. No evidence of mitral valve regurgitation. No evidence of mitral valve stenosis. MV peak gradient, 1.9 mmHg. The mean mitral valve gradient is 1.0 mmHg. Tricuspid  Valve: The tricuspid valve is normal in structure. Tricuspid valve regurgitation is trivial. No evidence of tricuspid stenosis. Aortic Valve: The aortic valve is tricuspid. There is mild calcification of the aortic valve. Aortic valve regurgitation is not visualized. Aortic valve sclerosis/calcification is present, without any evidence of aortic stenosis. Aortic valve mean gradient measures 2.0 mmHg. Aortic valve peak gradient measures 2.4 mmHg. Aortic valve area, by VTI measures  2.81 cm. Pulmonic Valve: The pulmonic valve was normal in structure. Pulmonic valve regurgitation is not visualized. No evidence of pulmonic stenosis. Aorta: The aortic root is normal in size and structure. Venous: The inferior vena cava is normal in size with greater than 50% respiratory variability, suggesting right atrial pressure of 3 mmHg. IAS/Shunts: No atrial level shunt detected by color flow Doppler.  LEFT VENTRICLE PLAX 2D LVIDd:         4.60 cm     Diastology LVIDs:         3.40 cm     LV e' medial:    6.73 cm/s LV PW:         1.00 cm     LV E/e' medial:  9.6 LV IVS:        0.70 cm     LV e' lateral:   7.50 cm/s LVOT diam:     2.30 cm     LV E/e' lateral: 8.6 LV SV:         50 LV SV Index:   28 LVOT Area:     4.15 cm  LV Volumes (MOD) LV vol d, MOD A2C: 45.0 ml LV vol d, MOD A4C: 58.9 ml LV vol s, MOD A2C: 20.3 ml LV vol s, MOD A4C: 27.6 ml LV SV MOD A2C:     24.7 ml LV SV MOD A4C:     58.9 ml LV SV MOD BP:      29.1 ml RIGHT VENTRICLE             IVC RV Basal diam:  3.80 cm     IVC diam: 1.60 cm RV S prime:     26.10 cm/s TAPSE (M-mode): 2.0 cm LEFT ATRIUM           Index        RIGHT ATRIUM           Index LA diam:      3.20 cm 1.83 cm/m   RA Area:     14.70 cm LA Vol (A4C): 20.7 ml 11.81 ml/m  RA Volume:   36.40 ml  20.77 ml/m  AORTIC VALVE AV Area (Vmax):    2.83 cm AV Area (Vmean):   2.48 cm AV Area (VTI):     2.81 cm AV Vmax:           78.00 cm/s AV Vmean:          58.600 cm/s AV VTI:            0.177 m AV Peak Grad:      2.4 mmHg AV Mean Grad:      2.0 mmHg LVOT Vmax:         53.20 cm/s LVOT Vmean:        35.000 cm/s LVOT VTI:          0.120 m LVOT/AV VTI ratio: 0.68  AORTA Ao Root diam: 3.60 cm MITRAL VALVE               TRICUSPID VALVE MV Area (PHT): 3.39 cm  TR Peak grad:   27.0 mmHg MV Area VTI:   3.30 cm    TR Vmax:        260.00 cm/s MV Peak grad:  1.9 mmHg MV Mean grad:  1.0 mmHg    SHUNTS MV Vmax:       0.68 m/s    Systemic VTI:  0.12 m MV Vmean:      44.6 cm/s   Systemic Diam:  2.30 cm MV Decel Time: 224 msec MV E velocity: 64.60 cm/s MV Bryton Waight velocity: 66.80 cm/s MV E/Luci Bellucci ratio:  0.97 Arvilla Meres MD Electronically signed by Arvilla Meres MD Signature Date/Time: 12/19/2022/11:29:18 AM    Final    CT Angio Chest PE W and/or Wo Contrast  Result Date: 12/18/2022 CLINICAL DATA:  Pulmonary embolism suspected. High probability. Shortness of breath and cough. EXAM: CT ANGIOGRAPHY CHEST WITH CONTRAST TECHNIQUE: Multidetector CT imaging of the chest was performed using the standard protocol during bolus administration of intravenous contrast. Multiplanar CT image reconstructions and MIPs were obtained to evaluate the vascular anatomy. RADIATION DOSE REDUCTION: This exam was performed according to the departmental dose-optimization program which includes automated exposure control, adjustment of the mA and/or kV according to patient size and/or use of iterative reconstruction technique. CONTRAST:  75mL OMNIPAQUE IOHEXOL 350 MG/ML SOLN COMPARISON:  Portable chest today portable chest 11/29/2022. No prior cross-sectional imaging for comparison. FINDINGS: Cardiovascular: The pulmonary trunk is prominent measuring 3.3 cm, and there is also elevated RV/LV ratio of 1.2 consistent with right heart dysfunction/strain. There are no central arterial clots, no large emboli. There is Gudrun Axe small thrombus in Jalayia Bagheri posterior basal right lower lobe segmental artery on 4: 97-100. On the left there are subsegmental lingular emboli on 4: 83-84 and 4:94-100. In the left lower lobe there is Kesley Mullens lateral basal subsegmental embolus on 4: 100-104 with extension into the downstream small arteries in this distribution. No further emboli are seen. The heart is mildly enlarged. Pericardial effusion noted circumferentially up to maximum 2 cm in thickness to the right. The pulmonary veins are normal caliber. The great vessels are clear and branch normally. There is mild aortic atherosclerosis and tortuosity, borderline dilatation of  the aortic root at the sinuses of Valsalva measuring 3.9 cm, and Greysen Swanton slightly dilated ascending aorta measuring 4.2 cm. There is no aortic or great vessel dissection or stenosis. There are no visible coronary calcific plaques. There are small air pockets in both internal jugular veins, most likely either injected at the time of contrast injection or with placement of the patient's IV. There is no central air embolus. Mediastinum/Nodes: There is Setsuko Robins heterogeneous 1 cm nodule in the left lobe of the thyroid gland.No follow-up imaging is recommended. Reference: J Am Coll Radiol. 2015 Feb;12(2): 143-50. There is no supraclavicular, axillary or intrathoracic adenopathy. Thoracic esophagus, both main bronchi and thoracic trachea are unremarkable. Lungs/Pleura: Minimal centrilobular and paraseptal emphysematous change both lung apices. Diffuse bronchial thickening. Scattered subsegmental bronchial impactions are noted in the posterior basal left lower lobe with patchy consolidation which is most likely due to bronchopneumonia although component of infarct is possible. There is mild posterior atelectasis in the right lower lobe. No bronchial impactions are seen on the right. The lungs are otherwise clear. There is Eleasha Cataldo small posteromedial right diaphragmatic fat herniation into the lower chest. There are trace pleural effusions. No pneumothorax or pleural thickening. Upper Abdomen: No acute radiographic findings. Streak artifact limits fine detail due to the patient's arms in the field. There is Evoleth Nordmeyer  3.2 cm simple appearing cyst in the superior pole of the right kidney with Johnedward Brodrick Hounsfield density of 11.4. No follow-up imaging is recommended. In hepatic segment 5 there is an indeterminate hypodense lesion measuring 3.2 cm with appearance of vessels at its periphery, possibly Kaliopi Blyden hemangioma but further evaluation is recommended. Musculoskeletal: There is multilevel bridging enthesopathy of the thoracic spine. No spinal compression fracture  or other acute or significant osseous findings. The ribcage is intact. No chest wall mass. Review of the MIP images confirms the above findings. IMPRESSION: 1. Positive for acute PE with CT findings of right heart strain (RV/LV Ratio = 1.2 ) consistent with at least submassive (intermediate risk) PE, although the visible clot burden is small. The presence of right heart strain has been associated with an increased risk of morbidity and mortality. Please refer to the "Code PE Focused" order set in EPIC. 2. Cardiomegaly with pericardial effusion. 3. Aortic atherosclerosis with borderline dilatation of the aortic root at the sinuses of Valsalva, and slightly dilated ascending aorta at 4.2 cm. No dissection or stenosis. Recommend annual imaging followup by CTA or MRA. This recommendation follows 2010 ACCF/AHA/AATS/ACR/ASA/SCA/SCAI/SIR/STS/SVM Guidelines for the Diagnosis and Management of Patients with Thoracic Aortic Disease. Circulation. 2010; 121: Z610-R604. Aortic aneurysm NOS (ICD10-I71.9) 4. Bronchitis with scattered subsegmental bronchial impactions or aspiration in the posterior basal left lower lobe, with patchy consolidation which is most likely due to bronchopneumonia although component of infarct is possible. Follow-up study recommended after treatment to ensure clearing. 5. Trace pleural effusions. 6. Indeterminate 3.2 cm hypodense lesion in hepatic segment 5. Follow-up nonemergent MRI or CT without and with contrast recommended. 7. Small air pockets in both internal jugular veins, most likely either injected at the time of contrast injection or with placement of the patient's IV. 8. Emphysema. 9. Critical Value/emergent results were called by telephone at the time of interpretation on 12/18/2022 at 10:23 pm to provider [facility] , who verbally acknowledged these results. Aortic Atherosclerosis (ICD10-I70.0) and Emphysema (ICD10-J43.9). Electronically Signed   By: Almira Bar M.D.   On: 12/18/2022 22:32    DG Chest Port 1 View  Result Date: 12/18/2022 CLINICAL DATA:  Shortness of breath and cough EXAM: PORTABLE CHEST 1 VIEW COMPARISON:  Radiograph 11/29/2022 FINDINGS: Stable cardiomediastinal silhouette. No focal consolidation, pleural effusion, or pneumothorax. No displaced rib fractures. Advanced arthritis both shoulders. IMPRESSION: No active disease. Electronically Signed   By: Minerva Fester M.D.   On: 12/18/2022 18:02        Scheduled Meds:  Chlorhexidine Gluconate Cloth  6 each Topical Daily   Continuous Infusions:  sodium chloride     sodium chloride     ampicillin (OMNIPEN) IV Stopped (12/20/22 5409)   cefTRIAXone (ROCEPHIN)  IV Stopped (12/20/22 8119)   heparin 1,400 Units/hr (12/20/22 0800)   lactated ringers     metronidazole Stopped (12/20/22 0013)   norepinephrine (LEVOPHED) Adult infusion Stopped (12/20/22 0700)   potassium chloride 100 mL/hr at 12/20/22 0800     LOS: 2 days    Time spent: over 30 min    Lacretia Nicks, MD [facility]   To contact the attending provider between 7A-7P or the covering provider during after hours 7P-7A, please log into the web site [email or web address] and access using universal [place] password for that web site. If you do not have the password, please call the hospital operator.  12/20/2022, 8:29 AM

## 2022-12-20 NOTE — TOC Progression Note (Signed)
Transition of Care [facility]) - Progression Note    Patient Details  Name: Edwin [name] MRN: [id number] Date of Birth: 11-30-1933  [facility]) CM/SW Contact  Coralyn Helling, [place] Phone Number: 12/20/2022, 9:30 AM  Clinical Narrative:     Transition of Care [facility]) - Inpatient Brief Assessment   Patient Details  Name: Edwin [name] MRN: [id number] Date of Birth: Feb 22, 1934  Transition of Care [facility]) CM/SW Contact:    Coralyn Helling, LCSW Phone Number: 12/20/2022, 9:30 AM   Clinical Narrative: Patient from home with spouse. Patient has dementia. Wheelchair at baseline. Help from family and home health aide.   TOC will follow for dc needs.    Transition of Care Asessment: Insurance and Status: Insurance coverage has been reviewed Patient has primary care physician: Yes Home environment has been reviewed: l.Home  with spouse Prior level of function:: Needs assistance. Prior/Current Home Services: Current home services [facility] aide) Social Determinants of Health Reivew: SDOH reviewed needs interventions Readmission risk has been reviewed: No Transition of care needs: transition of care needs identified, TOC will continue to follow        Expected Discharge Plan and Services                                               Social Determinants of Health (SDOH) Interventions SDOH Screenings   Tobacco Use: Low Risk  (12/18/2022)    Readmission Risk Interventions     No data to display

## 2022-12-20 NOTE — Progress Notes (Signed)
ANTICOAGULATION CONSULT NOTE - Follow Up Consult  Pharmacy Consult for Heparin Indication: pulmonary embolus  Allergies  Allergen Reactions   Milk-Related Compounds Other (See Comments)    NO DAIRY, NO YOGURT, NO ICE CREAM, NO CHEESE, NO MILK (PREFERENCE)   Beef-Derived Products Other (See Comments)    Requested by wife.   Egg-Derived Products Other (See Comments)    Patient wife states they are vegetarian, eggs are not apart of their diet.    Other Other (See Comments)    Unnamed antibiotic- "Made him sick"- no further elaboration  Patient prefers to not take medication, in general    Patient Measurements: Height: 5\' 11"  (180.3 cm) Weight: 64.7 kg (142 lb 10.2 oz) IBW/kg (Calculated) : 75.3 Heparin Dosing Weight: TBW  Vital Signs: Temp: 97.5 F (36.4 C) (07/14 1230) Temp Source: Axillary (07/14 1230) BP: 109/87 (07/14 1200) Pulse Rate: 108 (07/14 1300)  Labs: Recent Labs    12/18/22 1807 12/18/22 2125 12/19/22 0325 12/19/22 0949 12/19/22 1849 12/20/22 0337  HGB 9.4*  --  8.2*  --   --  8.1*  HCT 29.2*  --  25.5*  --   --  24.7*  PLT 153  --  129*  --   --  140*  APTT 28  --   --   --   --   --   LABPROT 16.7*  --   --   --   --   --   INR 1.3*  --   --   --   --   --   HEPARINUNFRC  --   --   --  0.36 0.23* 0.28*  CREATININE 0.81  --  0.61  --   --  0.40*  TROPONINIHS  --  59* 48*  --   --   --     Estimated Creatinine Clearance: 60.7 mL/min (A) (by C-G formula based on SCr of 0.4 mg/dL (L)).   Medications:  Infusions:   sodium chloride     sodium chloride     ampicillin (OMNIPEN) IV Stopped (12/20/22 1257)   cefTRIAXone (ROCEPHIN)  IV Stopped (12/20/22 4098)   heparin 1,400 Units/hr (12/20/22 1200)   lactated ringers     metronidazole Stopped (12/20/22 1127)    Assessment: 40 male with advanced dementia presents for evaluation of abnormal vital signs. CT shows PE, pharmacy to dose heparin drip. No prior AC noted   Today, 12/20/2022: Heparin level  0.51, therapeutic on heparin 1400 units/hr CBC: Hgb decreased to 8.1 (baseline in 8-9 range), Plt 140 No bleeding or complications reported. No pauses, interruptions, or IV issues per RN.   Goal of Therapy:  Heparin level 0.3-0.7 units/ml Monitor platelets by anticoagulation protocol: Yes   Plan:  Continue heparin IV infusion at 1400 units/hr Confirmatory heparin level in 8 hours Daily heparin level and CBC   Lynann Beaver PharmD, BCPS [facility] main pharmacy [phone] 12/20/2022 1:45 PM

## 2022-12-21 ENCOUNTER — Inpatient Hospital Stay (HOSPITAL_COMMUNITY): Payer: Medicare HMO

## 2022-12-21 DIAGNOSIS — R609 Edema, unspecified: Secondary | ICD-10-CM

## 2022-12-21 DIAGNOSIS — L97913 Non-pressure chronic ulcer of unspecified part of right lower leg with necrosis of muscle: Secondary | ICD-10-CM | POA: Insufficient documentation

## 2022-12-21 DIAGNOSIS — A419 Sepsis, unspecified organism: Secondary | ICD-10-CM | POA: Diagnosis not present

## 2022-12-21 DIAGNOSIS — I96 Gangrene, not elsewhere classified: Secondary | ICD-10-CM | POA: Insufficient documentation

## 2022-12-21 DIAGNOSIS — L8994 Pressure ulcer of unspecified site, stage 4: Secondary | ICD-10-CM | POA: Insufficient documentation

## 2022-12-21 DIAGNOSIS — M4628 Osteomyelitis of vertebra, sacral and sacrococcygeal region: Secondary | ICD-10-CM | POA: Insufficient documentation

## 2022-12-21 DIAGNOSIS — R6521 Severe sepsis with septic shock: Secondary | ICD-10-CM | POA: Diagnosis not present

## 2022-12-21 LAB — COMPREHENSIVE METABOLIC PANEL
ALT: 24 U/L (ref 0–44)
AST: 28 U/L (ref 15–41)
Albumin: 1.7 g/dL — ABNORMAL LOW (ref 3.5–5.0)
Alkaline Phosphatase: 59 U/L (ref 38–126)
Anion gap: 10 (ref 5–15)
BUN: 8 mg/dL (ref 8–23)
CO2: 20 mmol/L — ABNORMAL LOW (ref 22–32)
Calcium: 8.4 mg/dL — ABNORMAL LOW (ref 8.9–10.3)
Chloride: 109 mmol/L (ref 98–111)
Creatinine, Ser: 0.6 mg/dL — ABNORMAL LOW (ref 0.61–1.24)
GFR, Estimated: >60 mL/min (ref >60)
Glucose, Bld: 55 mg/dL — ABNORMAL LOW (ref 70–99)
Potassium: 3.8 mmol/L (ref 3.5–5.1)
Sodium: 139 mmol/L (ref 135–145)
Total Bilirubin: 0.6 mg/dL (ref 0.3–1.2)
Total Protein: 5.4 g/dL — ABNORMAL LOW (ref 6.5–8.1)

## 2022-12-21 LAB — CULTURE, BLOOD (ROUTINE X 2): Special Requests: ADEQUATE

## 2022-12-21 LAB — CBC
HCT: 27.9 % — ABNORMAL LOW (ref 39.0–52.0)
Hemoglobin: 8.8 g/dL — ABNORMAL LOW (ref 13.0–17.0)
MCH: 23.9 pg — ABNORMAL LOW (ref 26.0–34.0)
MCHC: 31.5 g/dL (ref 30.0–36.0)
MCV: 75.8 fL — ABNORMAL LOW (ref 80.0–100.0)
Platelets: 171 10*3/uL (ref 150–400)
RBC: 3.68 MIL/uL — ABNORMAL LOW (ref 4.22–5.81)
RDW: 17.9 % — ABNORMAL HIGH (ref 11.5–15.5)
WBC: 16.6 10*3/uL — ABNORMAL HIGH (ref 4.0–10.5)
nRBC: 0.2 % (ref 0.0–0.2)

## 2022-12-21 LAB — HEPARIN LEVEL (UNFRACTIONATED): Heparin Unfractionated: 0.69 IU/mL (ref 0.30–0.70)

## 2022-12-21 LAB — GLUCOSE, CAPILLARY
Glucose-Capillary: 46 mg/dL — ABNORMAL LOW (ref 70–99)
Glucose-Capillary: 83 mg/dL (ref 70–99)

## 2022-12-21 LAB — MAGNESIUM: Magnesium: 2.3 mg/dL (ref 1.7–2.4)

## 2022-12-21 LAB — PHOSPHORUS: Phosphorus: 2.5 mg/dL (ref 2.5–4.6)

## 2022-12-21 MED ORDER — DEXTROSE 50 % IV SOLN
INTRAVENOUS | Status: AC
  Administered 2022-12-21: 25 g via INTRAVENOUS
  Filled 2022-12-21: qty 50

## 2022-12-21 MED ORDER — HALOPERIDOL LACTATE 5 MG/ML IJ SOLN: 0.5000 mg | INTRAMUSCULAR | Status: DC | PRN

## 2022-12-21 MED ORDER — MORPHINE SULFATE (PF) 2 MG/ML IV SOLN
1.0000 mg | INTRAVENOUS | Status: DC | PRN
  Administered 2022-12-24: 1 mg via INTRAVENOUS
  Filled 2022-12-21: qty 1

## 2022-12-21 MED ORDER — DEXTROSE 50 % IV SOLN: 25.0000 g | INTRAVENOUS | Status: AC

## 2022-12-21 MED ORDER — THIAMINE HCL 100 MG/ML IJ SOLN
100.0000 mg | Freq: Every day | INTRAMUSCULAR | Status: DC
  Administered 2022-12-21: 100 mg via INTRAVENOUS
  Filled 2022-12-21: qty 2

## 2022-12-21 MED ORDER — ACETAMINOPHEN 325 MG PO TABS: 650.0000 mg | ORAL_TABLET | Freq: Four times a day (QID) | ORAL | Status: DC | PRN

## 2022-12-21 MED ORDER — ONDANSETRON HCL 4 MG/2ML IJ SOLN: 4.0000 mg | Freq: Four times a day (QID) | INTRAMUSCULAR | Status: DC | PRN

## 2022-12-21 MED ORDER — ONDANSETRON 4 MG PO TBDP: 4.0000 mg | ORAL_TABLET | Freq: Four times a day (QID) | ORAL | Status: DC | PRN

## 2022-12-21 MED ORDER — POLYVINYL ALCOHOL 1.4 % OP SOLN
1.0000 [drp] | Freq: Four times a day (QID) | OPHTHALMIC | Status: DC | PRN
  Filled 2022-12-21: qty 15

## 2022-12-21 MED ORDER — DEXTROSE IN LACTATED RINGERS 5 % IV SOLN: INTRAVENOUS | Status: DC

## 2022-12-21 MED ORDER — HALOPERIDOL LACTATE 2 MG/ML PO CONC
0.5000 mg | ORAL | Status: DC | PRN
  Filled 2022-12-21: qty 5

## 2022-12-21 MED ORDER — DIAZEPAM 5 MG/ML IJ SOLN: 2.5000 mg | INTRAMUSCULAR | Status: DC | PRN

## 2022-12-21 MED ORDER — HALOPERIDOL 0.5 MG PO TABS: 0.5000 mg | ORAL_TABLET | ORAL | Status: DC | PRN

## 2022-12-21 MED ORDER — DAKINS (1/4 STRENGTH) 0.125 % EX SOLN
Freq: Two times a day (BID) | CUTANEOUS | Status: AC
  Filled 2022-12-21: qty 473

## 2022-12-21 MED ORDER — GLYCOPYRROLATE 0.2 MG/ML IJ SOLN: 0.2000 mg | INTRAMUSCULAR | Status: DC | PRN

## 2022-12-21 MED ORDER — BIOTENE DRY MOUTH MT LIQD: 15.0000 mL | OROMUCOSAL | Status: DC | PRN

## 2022-12-21 MED ORDER — GLYCOPYRROLATE 0.2 MG/ML IJ SOLN
0.2000 mg | INTRAMUSCULAR | Status: DC | PRN
  Administered 2022-12-24: 0.2 mg via INTRAVENOUS
  Filled 2022-12-21: qty 1

## 2022-12-21 MED ORDER — ACETAMINOPHEN 650 MG RE SUPP: 650.0000 mg | Freq: Four times a day (QID) | RECTAL | Status: DC | PRN

## 2022-12-21 MED ORDER — COLLAGENASE 250 UNIT/GM EX OINT
TOPICAL_OINTMENT | Freq: Every day | CUTANEOUS | Status: DC
  Filled 2022-12-21: qty 30

## 2022-12-21 MED ORDER — GLYCOPYRROLATE 1 MG PO TABS: 1.0000 mg | ORAL_TABLET | ORAL | Status: DC | PRN

## 2022-12-21 NOTE — Progress Notes (Addendum)
Hypoglycemic Event  CBG: 46 at 11:17am  Treatment: D50 50 mL (25 gm) at 11:24am  Symptoms: None noted  Follow-up CBG: Time:11:43am CBG Result:83  Possible Reasons for Event: Inadequate meal intake  Comments/MD notified:Dr. Lowell Guitar; new orders received and carried out

## 2022-12-21 NOTE — Progress Notes (Signed)
ANTICOAGULATION CONSULT NOTE - Follow Up Consult  Pharmacy Consult for Heparin Indication: pulmonary embolus  Allergies  Allergen Reactions   Milk-Related Compounds Other (See Comments)    NO DAIRY, NO YOGURT, NO ICE CREAM, NO CHEESE, NO MILK (PREFERENCE)   Beef-Derived Products Other (See Comments)    Requested by wife.   Egg-Derived Products Other (See Comments)    Patient wife states they are vegetarian, eggs are not apart of their diet.    Other Other (See Comments)    Unnamed antibiotic- "Made him sick"- no further elaboration  Patient prefers to not take medication, in general    Patient Measurements: Height: 5\' 11"  (180.3 cm) Weight: 64.7 kg (142 lb 10.2 oz) IBW/kg (Calculated) : 75.3 Heparin Dosing Weight: TBW  Vital Signs: Temp: 96.4 F (35.8 C) (07/15 0816) Temp Source: Axillary (07/15 0816) BP: 96/60 (07/15 0600) Pulse Rate: 84 (07/15 0600)  Labs: Recent Labs    12/18/22 1807 12/18/22 2125 12/19/22 0325 12/19/22 0949 12/20/22 0337 12/20/22 1328 12/20/22 2327 12/21/22 0514  HGB 9.4*  --  8.2*  --  8.1*  --   --  8.8*  HCT 29.2*  --  25.5*  --  24.7*  --   --  27.9*  PLT 153  --  129*  --  140*  --   --  171  APTT 28  --   --   --   --   --   --   --   LABPROT 16.7*  --   --   --   --   --   --   --   INR 1.3*  --   --   --   --   --   --   --   HEPARINUNFRC  --   --   --    < > 0.28* 0.51 0.66 0.69  CREATININE 0.81  --  0.61  --  0.40*  --   --  0.60*  TROPONINIHS  --  59* 48*  --   --   --   --   --    < > = values in this interval not displayed.    Estimated Creatinine Clearance: 60.7 mL/min (A) (by C-G formula based on SCr of 0.6 mg/dL (L)).   Medications:  Infusions:   sodium chloride     sodium chloride     ampicillin (OMNIPEN) IV 2 g (12/21/22 0830)   cefTRIAXone (ROCEPHIN)  IV Stopped (12/21/22 1610)   heparin 1,400 Units/hr (12/21/22 0100)   lactated ringers     metronidazole Stopped (12/20/22 2324)    Assessment: 34 male with  advanced dementia presents for evaluation of abnormal vital signs. CT shows PE, pharmacy to dose heparin drip. No prior AC noted   Today, 12/21/2022: -Heparin level 0.69 - therapeutic on heparin at 1400 units/hr -CBC stable -No bleeding or infusion issues noted  Goal of Therapy:  Heparin level 0.3-0.7 units/ml Monitor platelets by anticoagulation protocol: Yes   Plan:  -Continue heparin infusion at 1400 units/hr -Daily heparin level and CBC -Follow for transition to oral anticoagulation as appropriate   Pricilla Riffle, PharmD, BCPS Clinical Pharmacist 12/21/2022 8:38 AM

## 2022-12-21 NOTE — Consult Note (Addendum)
WOC Nurse Consult Note: this consult made with surgeon in room, WOC present throughout bedside debridement, measurements made prior to debridement  Reason for Consult: sacral and R lower extremity wounds  Wound type: Pressure  Pressure Injury POA: Yes Measurement: 1.  L great toe Unstageable Pressure Injury 1.5 cm x 1.5 cm 100% black dry eschar.  2.  L heel Unstageable Pressure Injury 4 cm x 4 cm 100% black dry eschar  3.  R lateral ankle Unstageable PI 0.5 cm x 0.5 cm 100% dry black eschar  4.  R lower lateral leg Stage 4 PI 9.5 cm x 4 cm x 2 cm 75% red moist 25% gray slough prior to debridement 5.  Sacrum Unstageable PI 14 cm x 12 cm 100% black leathery foul smelling eschar prior to debridement; Stage 4 after debridement with palpable bone  6.  L posterior shoulder 1 cm x 1 cm area of partial thickness skin loss; L upper mid back Unstageable PI 2 cm x 5 cm 75% black yellow necrotic tissue  25% pink  7.  R posterior shoulder Deep Tissue Pressure Injury x 2 separate areas 1 cm x 3 cm distal to that area 2 cm x 2 cm; Deep Tissue Pressure Injury vertebral column 1 cm x 1 cm 100% purple maroon discoloration   Wound bed: as above  Drainage (amount, consistency, odor) serosanguinous R lower leg, tan foul smelling exudate from sacral wound, tan exudate from L upper mid back  Periwound: intact Dressing procedure/placement/frequency:  Unstageable L great toe, L heel, R lateral ankle paint with Betadine twice daily, allow areas to dry. Place bilateral feet in Prevalon boots (patient is wearing these at time of visit).  Clean R lower leg wound with NS and apply saline moistened (not saturated) gauze to wound bed twice daily.  Dr. Michaell Cowing has written orders for Dakin's to Sacral wound x 48 hours along with PT hydrotherapy then re-evaluate wound care.  Will likely use Santyl once 48 hours of Dakins is up as it is available in the system now, WOC will re-evaluate.  L upper back Unstageable clean with NS, apply  Santyl to wound bed daily, cover with saline moistened gauze and cover with silicone foam. May lift foam daily to reapply Santyl.  Change foam dressing 3 days and prn soiling.   Cover all areas of Deep Tissue Pressure Injury to shoulders and vertebral column with foam dressing.  Lift foam daily to assess.  Change foam dressing q3 days and prn soiling.    POC discussed with bedside nurse.  WOC team will follow along with PT hydrotherapy for recommendations on wound care moving forward.  Patient should remain on low air loss mattress throughout hospitalization.    Thank you,    Priscella Mann MSN, RN-BC, [employer] [phone]

## 2022-12-21 NOTE — Progress Notes (Signed)
Civil engineer, contracting [facility]) Hospital Liaison Note  Received request from Transitions of Care Manager, Cookie, for [employer].  Chart and patient information under review by [facility] physician.    Spoke with spouse/Sylvia to initiate education related to hospice philosophy, services, and team approach to care. [name] verbalized understanding of information given. Per discussion, spouse has opted to take patient home vs. [employer] as family has concerns of room & board fees.   DME needs discussed. Patient has the following equipment in the home Bed Wheelchair Patient requests the following equipment for delivery: N/A  Address verified and is correct in the chart.  Please send signed and completed DNR home with patient/family. Please provide prescriptions at discharge as needed to ensure ongoing symptom management.    [facility] information and contact numbers given to family & above information shared with TOC.   Please call with any questions/concerns.    Thank you for the opportunity to participate in this patient's care.   Eugenie Birks, MSW [facility] Liaison  [phone]

## 2022-12-21 NOTE — Progress Notes (Addendum)
PT HYDROTHERAPY EVALUATION AND TX  12/21/22 1200  Subjective Assessment  Subjective pt is minimally verbal  Patient and Family Stated Goals wife - wound healing  Date of Onset  (present on admission)  Prior Treatments dressing changes at home, bedside debridement per Dr. Michaell Cowing  89 yo man admitted with worsening cough via EMS. In ED pt was hypotensive and tachycardic. Noted to have a large sacral decubitus ulcer, right lower extremity decubitus ulcer with significant breakdown and malodorous drainage. As well as multiple other wounds. CTA chest identified scattered subsegmental bronchial impaction posterior left lower lobe, to a lesser degree posterior right lower lobe as well as small bilateral segmental and subsegmental pulmonary embolism.   PMH: dementia, right hip fracture/hemiarthroplasty 05/2022, bedridden and nonverbal at baseline.   PT following for tx of sacral wound only; wound is near completely necrotic, malodorous, fluctuant with palpable bone at base of wound,  copious exudate soaking through prior dressing and 2 bed pads; pt left in NAD on dry bed pads end of session, BP 126/87, SpO2=92-98%, HR in 80s  Wife brought in to pt room by RN at wife's request, reviewed procedure with wife who asked if wound would heal.  After PT session wife requested to not be present for future treatments. Wife stated to PT that she was ok and did want to observe today.    Evaluation and Treatment        Evaluation and Treatment Procedures Explained to Patient/Family Yes  Evaluation and Treatment Procedures Patient unable to consent due to mental status (wife agrees)  Pressure Injury 12/21/22 Sacrum Mid Unstageable - Full thickness tissue loss in which the base of the injury is covered by slough (yellow, tan, gray, green or brown) and/or eschar (tan, brown or black) in the wound bed. *PT ONLY* unstageable, pressure in  Date First Assessed: 12/21/22   Location: Sacrum  Location Orientation: Mid   Staging: Unstageable - Full thickness tissue loss in which the base of the injury is covered by slough (yellow, tan, gray, green or brown) and/or eschar (tan, brown or black)...  Wound Image   (pics per admitting MD-12/19/22)  Dressing Type Foam - Lift dressing to assess site every shift;Dakin's-soaked gauze  Dressing Changed  Dressing Change Frequency PRN  State of Healing Non-healing  Site / Wound Assessment Brown;Painful;Black  % Wound base Red or Granulating 5% (<5%)  % Wound base Yellow/Fibrinous Exudate 5%  % Wound base Black/Eschar 90%  Peri-wound Assessment Intact  Wound Length (cm) 20 cm  Wound Width (cm) 15 cm  Wound Surface Area (cm^2) 300 cm^2  Tunneling (cm) 5.5 (at ~4:00)  Margins Unattached edges (unapproximated)  Drainage Amount Copious  Drainage Description Purulent;Odor - foul;Serosanguineous  Treatment Debridement (Selective);Hydrotherapy (Pulse lavage);Packing (Saline gauze) (dakin's packing)  Hydrotherapy  Pulsed Lavage with Suction (psi) 8 psi  Pulsed Lavage with Suction - Normal Saline Used 1000 mL  Pulsed Lavage Tip Tip with splash shield  Pulsed lavage therapy - wound location sacrum  Selective Debridement (non-excisional)  Selective Debridement (non-excisional) - Location sacrum  Selective Debridement (non-excisional) - Tools Used Forceps;Scissors  Selective Debridement (non-excisional) - Tissue Removed gauze  Wound Therapy - Assess/Plan/Recommendations  Wound Therapy - Clinical Statement pt will benefit from hydrotherapy to reduce bioburden. wound may increase in size prior to improvement; given immobility, diminished oral intake and additional co-morbitites  wound is unlikely to heal with conservative treatment  Wound Therapy - Functional Problem List immobility, bedbound  Factors Delaying/Impairing Wound Healing Incontinence;Immobility;Multiple medical problems  Hydrotherapy Plan Dressing change;Debridement;Patient/family education;Pulsatile lavage with  suction  Wound Therapy - Frequency 2X / week  Wound Therapy - Follow Up Recommendations f/u pulsed lavage with suction;f/u selective debridement  Wound Therapy Goals - Improve the function of patient's integumentary system by progressing the wound(s) through the phases of wound healing by:  Decrease Necrotic Tissue to 75  Decrease Necrotic Tissue - Progress Goal set today  Increase Granulation Tissue to 25  Increase Granulation Tissue - Progress Goal set today  Improve Drainage Characteristics Mod  Improve Drainage Characteristics - Progress Goal set today  Goals/treatment plan/discharge plan were made with and agreed upon by patient/family No, Patient unable to participate in goals/treatment/discharge plan and family unavailable (wife arrived after session begun, explained to pt wife)  Time For Goal Achievement 7 days  Wound Therapy - Potential for Goals Poor   Delice Bison, PT  Acute Rehab Dept [facility]) [phone]  12/21/2022

## 2022-12-21 NOTE — Progress Notes (Addendum)
PROGRESS NOTE    Edwin [name]  ZOX:[id number] DOB: 1933/11/03 DOA: 12/18/2022 PCP: Edwin Awe, DO  Chief Complaint  Patient presents with   Shortness of Breath   Cough    Brief Narrative:   89 year old man with history of dementia, right hip fracture/hemiarthroplasty 05/2022, bedridden and nonverbal at baseline. Family is able to assist him into Edwin [name] wheelchair intermittently and he has home health assistance. He has reportedly had ongoing cough for Edwin [name] few weeks. There was concern that he was worsening and EMS was called. In the emergency department he has been relatively hypotensive and tachycardic. Noted to have Edwin [name] large sacral decubitus ulcer, right lower extremity decubitus ulcer with significant breakdown and malodorous drainage. CT-PA chest identified scattered subsegmental bronchial impaction posterior left lower lobe, to Edwin [name] lesser degree posterior right lower lobe as well as small bilateral segmental and subsegmental pulmonary embolism. RV/LV ratio 1.2 suggesting right heart strain. He has received vancomycin and cefepime, started on heparin infusion, volume resuscitation. Lactic acid slightly elevated 3.5, troponin 59, WBC 3.9 with Edwin [name] left shift.   Assessment & Plan:   Principal Problem:   Septic shock (HCC) Active Problems:   Dementia without behavioral disturbance (HCC)   Malnutrition of moderate degree   Chronic anemia   Pulmonary embolism (HCC)   Sacral osteomyelitis (HCC)   Gangrene with decubitus ulcer, stage 4 (HCC)   Leg ulcer, right, with necrosis of muscle (HCC)  Goals of care Palliative has been consulted, will continue discussions with family Yesterday the wife told me that he was Edwin [name] DNI.  She did not want for him to be intubated or be on mechanical ventilation.  I tried to expand on this as Edwin [name] discussion of his code status and recommend DNR, but she became upset and didn't want to discuss further.  Will continue to address with wife/family as opportunities arise.   Addendum 1:26 PM discussion with wife at bedside and outside room.  Discussed poor prognosis with decubitus ulcers, infections, blood clots, dementia, hypoglycemia/poor PO intake.  She was agreeable and interested in Edwin [name] plan to focus on his comfort and pursue inpatient hospice.  Will transition to comfort measures and consult TOC for inpatient hospice.  Septic Shock due to Polymicrobial Bacteremia  Osteomyelitis  Presumed Endocarditis  Sacral and Right Lower Extremity Decubitus Ulcers Imaging with 6.6 cm soft tissue ulcer along lateral aspect of mid to distal lower leg extending close to the underlying fibular diaphysis with mild fibular periosteal reaction concerning for osteo, no evidence nec fasc.  Prior hip arthroplasty with chronic periprosthetic fracture.  Sacral decubitus ulcer overlying the sacrum and coccyx, loss of cortical definition of posterior cortex of distal most sacral segment suggestive of osteomyelitis Cultures growing gram negative rods, gram positive cocci (BCID with e. Coli) Appreciate ID recs -> concern for endocarditis in setting of polymicrobial bacteremia with bilateral PE TTE with normal MV, aortic valve sclerosis, tricuspid valve normal in structure, pulmonic valve normal in structure Will plan to treat empirically for endocarditis/osteomyelitis (defer TEE) Continue abx with ampicillin, ceftriaxone, flagyl  Repeat blood cultures today Appreciate surgery - bedside debridement of the sacral decubitus and right lower extremity wound - appreciate wound care recs - they're recommending orthopedics consultation  Wound care c/s  Acute Pulmonary Embolism  Heparin gtt Echo with normal RV size, EF 55-60%, moderate sized anterior pericardial effusion, no RV strain  LE [place]  Hypoglycemia Related to malnutrition, currently NPO  Pericardial Effusion No evidence tamponade  Trace Pleural Effusions Consider repeat  imaging in Edwin [name] few days  Dementia At baseline some mumbling  speech, sounds like he will speak intelligibly at times Delirium precautions  Acute Metabolic Encephalopathy Seems altered in setting of infection Appreciate additional information from family Delirium precautions NPO for now until he can participate in swallow eval - will need to broach conversation of nutrition soon as he remains too altered to take PO   Right Radial Art line discontinued  Fecal Management System Continue to monitor output      DVT prophylaxis: heparin gtt Code Status: full Family Communication: discussion with daughter, 7/14 wife, 7/14 Disposition:   Status is: Inpatient Remains inpatient appropriate because: continued need for inpatient care   Consultants:  Pccm ID  Procedures:  Echo IMPRESSIONS     1. Left ventricular ejection fraction, by estimation, is 55 to 60%. The  left ventricle has normal function. The left ventricle has no regional  wall motion abnormalities. Left ventricular diastolic parameters are  consistent with Grade I diastolic  dysfunction (impaired relaxation).   2. Right ventricular systolic function is normal. The right ventricular  size is normal. Tricuspid regurgitation signal is inadequate for assessing  PA pressure.   3. Moderate pericardial effusion. The pericardial effusion is anterior to  the right ventricle.   4. The mitral valve is normal in structure. No evidence of mitral valve  regurgitation. No evidence of mitral stenosis.   5. The aortic valve is tricuspid. There is mild calcification of the  aortic valve. Aortic valve regurgitation is not visualized. Aortic valve  sclerosis/calcification is present, without any evidence of aortic  stenosis.   6. The inferior vena cava is normal in size with greater than 50%  respiratory variability, suggesting right atrial pressure of 3 mmHg.   Conclusion(s)/Recommendation(s): There is Edwin [name] moderate-sized anterior  pericardial effusion without evidence of overt tamponade.  there is no RV  strain.   Antimicrobials:  Anti-infectives (From admission, onward)    Start     Dose/Rate Route Frequency Ordered Stop   12/20/22 0000  vancomycin (VANCOREADY) IVPB 1250 mg/250 mL  Status:  Discontinued        1,250 mg 166.7 mL/hr over 90 Minutes Intravenous Every 24 hours 12/19/22 0034 12/19/22 0920   12/19/22 2200  metroNIDAZOLE (FLAGYL) IVPB 500 mg        500 mg 100 mL/hr over 60 Minutes Intravenous 2 times daily 12/19/22 1715     12/19/22 2000  ampicillin (OMNIPEN) 2 g in sodium chloride 0.9 % 100 mL IVPB        2 g 300 mL/hr over 20 Minutes Intravenous Every 4 hours 12/19/22 1715     12/19/22 1815  cefTRIAXone (ROCEPHIN) 2 g in sodium chloride 0.9 % 100 mL IVPB        2 g 200 mL/hr over 30 Minutes Intravenous 2 times daily 12/19/22 1715     12/19/22 1030  piperacillin-tazobactam (ZOSYN) IVPB 3.375 g  Status:  Discontinued        3.375 g 12.5 mL/hr over 240 Minutes Intravenous Every 8 hours 12/19/22 1015 12/19/22 1715   12/19/22 0800  ceFEPIme (MAXIPIME) 2 g in sodium chloride 0.9 % 100 mL IVPB  Status:  Discontinued        2 g 200 mL/hr over 30 Minutes Intravenous Every 8 hours 12/19/22 0034 12/19/22 0920   12/18/22 2230  ceFEPIme (MAXIPIME) 2 g in sodium chloride 0.9 % 100 mL IVPB        2 g 200 mL/hr over 30  Minutes Intravenous  Once 12/18/22 2222 12/18/22 2321   12/18/22 2230  metroNIDAZOLE (FLAGYL) IVPB 500 mg        500 mg 100 mL/hr over 60 Minutes Intravenous  Once 12/18/22 2222 12/19/22 0010   12/18/22 2230  vancomycin (VANCOCIN) IVPB 1000 mg/200 mL premix        1,000 mg 200 mL/hr over 60 Minutes Intravenous  Once 12/18/22 2222 12/19/22 0122       Subjective: Limited unintelligible moans Discussion with wife - she notes she's HCPOA and will make decisions for her husband.  She notes some surprise at the state of his wounds.  She doesn't think anyone should have to live like that.  She was agreeable with Vernor Monnig plan for comfort measures, she noted DNR  was consistent with his prior wishes.  Objective: Vitals:   12/21/22 0400 12/21/22 0500 12/21/22 0600 12/21/22 0816  BP: 111/70 108/71 96/60   Pulse: 86  84   Resp: (!) 21 15 14    Temp:  97.7 F (36.5 C)  (!) 96.4 F (35.8 C)  TempSrc:  Axillary  Axillary  SpO2: 93%  97%   Weight:  64.7 kg    Height:        Intake/Output Summary (Last 24 hours) at 12/21/2022 0848 Last data filed at 12/21/2022 0100 Gross per 24 hour  Intake 1334.59 ml  Output 450 ml  Net 884.59 ml   Filed Weights   12/19/22 0115 12/20/22 0358 12/21/22 0500  Weight: 58.7 kg 64.7 kg 64.7 kg    Examination:  General: No acute distress. Cardiovascular: RRR Lungs: diminished, unlabored Neurological: seems to acknowledge when I say his name briefly, but not consistent - mostly nonverbal  Extremities: wounds not examined today, palpable DP pulses - images reviewed and general surgery note reviewed  Data Reviewed: I have personally reviewed following labs and imaging studies  CBC: Recent Labs  Lab 12/18/22 1807 12/19/22 0325 12/20/22 0337 12/21/22 0514  WBC 9.1 24.0* 16.3* 16.6*  NEUTROABS 8.4*  --   --   --   HGB 9.4* 8.2* 8.1* 8.8*  HCT 29.2* 25.5* 24.7* 27.9*  MCV 74.9* 76.8* 72.0* 75.8*  PLT 153 129* 140* 171    Basic Metabolic Panel: Recent Labs  Lab 12/18/22 1807 12/19/22 0325 12/20/22 0337 12/21/22 0514  NA 137 139 140 139  K 3.1* 3.0* 3.2* 3.8  CL 104 108 110 109  CO2 24 22 23  20*  GLUCOSE 111* 98 83 55*  BUN 11 11 9 8   CREATININE 0.81 0.61 0.40* 0.60*  CALCIUM 9.5 8.9 8.2* 8.4*  MG  --  2.1 1.7 2.3  PHOS  --  1.6* 2.8 2.5    GFR: Estimated Creatinine Clearance: 60.7 mL/min (Malvika Tung) (by C-G formula based on SCr of 0.6 mg/dL (L)).  Liver Function Tests: Recent Labs  Lab 12/18/22 1807 12/19/22 0325 12/21/22 0514  AST 43* 42* 28  ALT 28 26 24   ALKPHOS 73 63 59  BILITOT 1.0 0.8 0.6  PROT 7.2 5.2* 5.4*  ALBUMIN 2.2* 1.6* 1.7*    CBG: No results for input(s): "GLUCAP" in  the last 168 hours.   Recent Results (from the past 240 hour(s))  Blood Culture (routine x 2)     Status: Abnormal (Preliminary result)   Collection Time: 12/18/22  6:18 PM   Specimen: BLOOD RIGHT WRIST  Result Value Ref Range Status   Specimen Description   Final    BLOOD RIGHT WRIST Performed at [facility],  [address] [place], [place] [address]    Special Requests   Final    BOTTLES DRAWN AEROBIC AND ANAEROBIC Blood Culture adequate volume Performed at [facility], [address], [place], [place] [address]    Culture  Setup Time   Final    GRAM NEGATIVE RODS GRAM POSITIVE COCCI IN BOTH AEROBIC AND ANAEROBIC BOTTLES CRITICAL RESULT CALLED TO, READ BACK BY AND VERIFIED WITH: PHARMD DREW WOFFORD [id number] 1232 BY J RAZZAK, MT    Culture (Labrandon Knoch)  Final    ENTEROCOCCUS FAECALIS ESCHERICHIA COLI SUSCEPTIBILITIES TO FOLLOW Performed at [facility], [address] [place], [place] [address]    Report Status PENDING  Incomplete  Blood Culture (routine x 2)     Status: None (Preliminary result)   Collection Time: 12/18/22  6:18 PM   Specimen: BLOOD  Result Value Ref Range Status   Specimen Description   Final    BLOOD RIGHT ANTECUBITAL Performed at [facility], [address], [place], [place] [address]    Special Requests   Final    BOTTLES DRAWN AEROBIC AND ANAEROBIC Blood Culture adequate volume Performed at [facility], [address] [place], [place] [address]    Culture  Setup Time   Final    GRAM NEGATIVE RODS ANAEROBIC BOTTLE ONLY GRAM POSITIVE COCCI AND GNR AEROBIC BOTTLE ONLY CRITICAL VALUE NOTED.  VALUE IS CONSISTENT WITH PREVIOUSLY REPORTED AND CALLED VALUE.    Culture   Final    GRAM NEGATIVE RODS ENTEROCOCCUS FAECALIS IDENTIFICATION TO FOLLOW Performed at [facility], [address], [place], [place] [address]    Report Status PENDING  Incomplete  Blood Culture ID Panel  (Reflexed)     Status: Abnormal   Collection Time: 12/18/22  6:18 PM  Result Value Ref Range Status   Enterococcus faecalis DETECTED (Elin Fenley) NOT DETECTED Final    Comment: CRITICAL RESULT CALLED TO, READ BACK BY AND VERIFIED WITH: PHARMD DREW WOFFORD [id number] 1232 BY J RAZZAK, MT    Enterococcus Faecium NOT DETECTED NOT DETECTED Final   Listeria monocytogenes NOT DETECTED NOT DETECTED Final   Staphylococcus species NOT DETECTED NOT DETECTED Final   Staphylococcus aureus (BCID) NOT DETECTED NOT DETECTED Final   Staphylococcus epidermidis NOT DETECTED NOT DETECTED Final   Staphylococcus lugdunensis NOT DETECTED NOT DETECTED Final   Streptococcus species NOT DETECTED NOT DETECTED Final   Streptococcus agalactiae NOT DETECTED NOT DETECTED Final   Streptococcus pneumoniae NOT DETECTED NOT DETECTED Final   Streptococcus pyogenes NOT DETECTED NOT DETECTED Final   Anahita Cua.calcoaceticus-baumannii NOT DETECTED NOT DETECTED Final   Bacteroides fragilis NOT DETECTED NOT DETECTED Final   Enterobacterales DETECTED (Jerlyn Pain) NOT DETECTED Final    Comment: Enterobacterales represent Aviv Lengacher large order of gram negative bacteria, not Greenley Martone single organism. CRITICAL RESULT CALLED TO, READ BACK BY AND VERIFIED WITH: PHARMD DREW WOFFORD [id number] 1232 BY J RAZZAK, MT    Enterobacter cloacae complex NOT DETECTED NOT DETECTED Final   Escherichia coli DETECTED (Felicidad Sugarman) NOT DETECTED Final    Comment: CRITICAL RESULT CALLED TO, READ BACK BY AND VERIFIED WITH: PHARMD DREW WOFFORD [id number] 1232 BY J RAZZAK, MT    Klebsiella aerogenes NOT DETECTED NOT DETECTED Final   Klebsiella oxytoca NOT DETECTED NOT DETECTED Final   Klebsiella pneumoniae NOT DETECTED NOT DETECTED Final   Proteus species NOT DETECTED NOT DETECTED Final   Salmonella species NOT DETECTED NOT DETECTED Final   Serratia marcescens NOT DETECTED NOT  DETECTED Final   Haemophilus influenzae NOT DETECTED NOT DETECTED Final   Neisseria meningitidis NOT DETECTED NOT DETECTED Final    Pseudomonas aeruginosa NOT DETECTED NOT DETECTED Final   Stenotrophomonas maltophilia NOT DETECTED NOT DETECTED Final   Candida albicans NOT DETECTED NOT DETECTED Final   Candida auris NOT DETECTED NOT DETECTED Final   Candida glabrata NOT DETECTED NOT DETECTED Final   Candida krusei NOT DETECTED NOT DETECTED Final   Candida parapsilosis NOT DETECTED NOT DETECTED Final   Candida tropicalis NOT DETECTED NOT DETECTED Final   Cryptococcus neoformans/gattii NOT DETECTED NOT DETECTED Final   CTX-M ESBL NOT DETECTED NOT DETECTED Final   Carbapenem resistance IMP NOT DETECTED NOT DETECTED Final   Carbapenem resistance KPC NOT DETECTED NOT DETECTED Final   Carbapenem resistance NDM NOT DETECTED NOT DETECTED Final   Carbapenem resist OXA 48 LIKE NOT DETECTED NOT DETECTED Final   Vancomycin resistance NOT DETECTED NOT DETECTED Final   Carbapenem resistance VIM NOT DETECTED NOT DETECTED Final    Comment: Performed at [facility], [address], [place], [place] [address]  MRSA Next Gen by PCR, Nasal     Status: None   Collection Time: 12/19/22  1:02 AM   Specimen: Nasal Mucosa; Nasal Swab  Result Value Ref Range Status   MRSA by PCR Next Gen NOT DETECTED NOT DETECTED Final    Comment: (NOTE) The GeneXpert MRSA Assay (FDA approved for NASAL specimens only), is one component of Natisha Trzcinski comprehensive MRSA colonization surveillance program. It is not intended to diagnose MRSA infection nor to guide or monitor treatment for MRSA infections. Test performance is not FDA approved in patients less than 75 years old. Performed at [facility], [address], [place], [place] [address]   C Difficile Quick Screen w PCR reflex     Status: None   Collection Time: 12/19/22  2:16 PM   Specimen: STOOL  Result Value Ref Range Status   C Diff antigen NEGATIVE NEGATIVE Final   C Diff toxin NEGATIVE NEGATIVE Final   C Diff interpretation No C. difficile detected.  Final    Comment:  Performed at [facility], [address], [place], [place] [address]         Radiology Studies: CT FEMUR RIGHT WO CONTRAST  Result Date: 12/19/2022 CLINICAL DATA:  Right leg infection. Evaluate for necrotizing fasciitis. EXAM: CT OF THE LOWER RIGHT EXTREMITY WITHOUT CONTRAST TECHNIQUE: Multidetector CT imaging of the right thigh and lower leg was performed according to the standard protocol. RADIATION DOSE REDUCTION: This exam was performed according to the departmental dose-optimization program which includes automated exposure control, adjustment of the mA and/or kV according to patient size and/or use of iterative reconstruction technique. COMPARISON:  None Available. FINDINGS: Bones/Joint/Cartilage Prior right hip arthroplasty. 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 The distal aspect of the fracture remains nonunited. No new fracture or dislocation. Mild periosteal reaction along the lateral aspect of the mid to distal fibular diaphysis (series 7, image 203). Degenerative changes of the right knee. Symmetric trace to small bilateral knee joint effusions. Ligaments Ligaments are suboptimally evaluated by CT. Muscles and Tendons Grossly intact. Soft tissue Right thigh and lower leg soft tissue swelling. 6.6 cm soft tissue ulcer along the lateral aspect of the mid to distal lower leg with Ronnie Mallette few tiny foci of subcutaneous emphysema. The ulcer extends close to the outer cortex of the fibular shaft. No  fluid collection or hematoma. No soft tissue mass. Please see same day CT abdomen pelvis report for intrapelvic findings. IMPRESSION: 1. 6.6 cm soft tissue ulcer along the lateral aspect of the mid to distal lower leg extending close to the underlying fibular diaphysis, with mild fibular periosteal reaction concerning for osteomyelitis. 2. No evidence of necrotizing fasciitis.  No definite abscess. 3. Prior right hip  arthroplasty with chronic periprosthetic fracture involving the greater trochanter and proximal shaft status post cerclage wire fixation. The distal aspect of the fracture remains nonunited. Electronically Signed   By: Obie Dredge M.D.   On: 12/19/2022 12:13   CT TIBIA FIBULA RIGHT WO CONTRAST  Result Date: 12/19/2022 CLINICAL DATA:  Right leg infection. Evaluate for necrotizing fasciitis. EXAM: CT OF THE LOWER RIGHT EXTREMITY WITHOUT CONTRAST TECHNIQUE: Multidetector CT imaging of the right thigh and lower leg was performed according to the standard protocol. RADIATION DOSE REDUCTION: This exam was performed according to the departmental dose-optimization program which includes automated exposure control, adjustment of the mA and/or kV according to patient size and/or use of iterative reconstruction technique. COMPARISON:  None Available. FINDINGS: Bones/Joint/Cartilage Prior right hip arthroplasty. 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 The distal aspect of the fracture remains nonunited. No new fracture or dislocation. Mild periosteal reaction along the lateral aspect of the mid to distal fibular diaphysis (series 7, image 203). Degenerative changes of the right knee. Symmetric trace to small bilateral knee joint effusions. Ligaments Ligaments are suboptimally evaluated by CT. Muscles and Tendons Grossly intact. Soft tissue Right thigh and lower leg soft tissue swelling. 6.6 cm soft tissue ulcer along the lateral aspect of the mid to distal lower leg with Rmani Kapusta few tiny foci of subcutaneous emphysema. The ulcer extends close to the outer cortex of the fibular shaft. No fluid collection or hematoma. No soft tissue mass. Please see same day CT abdomen pelvis report for intrapelvic findings. IMPRESSION: 1. 6.6 cm soft tissue ulcer along the lateral aspect of the mid to distal lower leg extending close to the underlying fibular  diaphysis, with mild fibular periosteal reaction concerning for osteomyelitis. 2. No evidence of necrotizing fasciitis.  No definite abscess. 3. 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 The distal aspect of the fracture remains nonunited. Electronically Signed   By: Obie Dredge M.D.   On: 12/19/2022 12:13   CT ABDOMEN PELVIS WO CONTRAST  Result Date: 12/19/2022 CLINICAL DATA:  decubitus ulcer, osteomyelitis EXAM: CT ABDOMEN AND PELVIS WITHOUT CONTRAST TECHNIQUE: Multidetector CT imaging of the abdomen and pelvis was performed following the standard protocol without IV contrast. RADIATION DOSE REDUCTION: This exam was performed according to the departmental dose-optimization program which includes automated exposure control, adjustment of the mA and/or kV according to patient size and/or use of iterative reconstruction technique. COMPARISON:  CT chest 12/18/2022. FINDINGS: Lower chest: Trace left pleural effusion with left greater than right bibasilar airspace opacities, slightly progressed. Small-moderate pericardial effusion, unchanged. Hepatobiliary: Two cysts in the right hepatic lobe, largest measuring up to 3.0 cm. Otherwise unremarkable unenhanced appearance of the liver. Layering hyperdense material within the gallbladder is likely related to vicarious contrast excretion. Pancreas: Unremarkable. No pancreatic ductal dilatation or surrounding inflammatory changes. Spleen: Normal in size without focal abnormality. Adrenals/Urinary Tract: Unremarkable adrenal glands. Bilateral renal cysts, which do not require follow-up imaging. There is contrast excretion within the bilateral renal collecting  systems and urinary bladder. No hydronephrosis. Stomach/Bowel: Stomach within normal limits. No dilated loops of bowel to suggest obstruction. Extensive diverticulosis. No focal bowel wall thickening is  evident. Labib Cwynar rectal tube is in place. Vascular/Lymphatic: Scattered aortoiliac atherosclerotic calcifications without aneurysm. No abdominopelvic lymphadenopathy. Reproductive: Prostate is unremarkable. Other: No free air or free fluid.  Mild mesenteric edema. Musculoskeletal: Sacral decubitus ulcer overlying the distal sacrum and coccyx. Loss of cortical definition of the posterior cortex of the distal most sacral segment suspicious for osteomyelitis (series 2, image 69). There is soft tissue edema and numerous foci of air within the soft tissues adjacent to the ulceration site. No organized or drainable fluid collections. Ankylosis of the imaged thoracolumbar spine. Large anterior endplate osteophyte at L3-L4. Both SI joints are fused. No acute fractures are identified. Prior right total hip arthroplasty with proximal cerclage wires. IMPRESSION: 1. Sacral decubitus ulcer overlying the distal sacrum and coccyx. Loss of cortical definition of the posterior cortex of the distal-most sacral segment suggestive of osteomyelitis. There is soft tissue edema and numerous foci of air within the soft tissues adjacent to the ulceration site. No organized or drainable fluid collections. 2. Trace left pleural effusion with left greater than right bibasilar airspace opacities, slightly progressed. 3. Small-moderate pericardial effusion, unchanged. 4. Extensive diverticulosis without evidence of acute diverticulitis. 5. 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 6. Aortic atherosclerosis (ICD10-I70.0). Electronically Signed   By: Duanne Guess D.O.   On: 12/19/2022 12:05   ECHOCARDIOGRAM COMPLETE  Result Date: 12/19/2022    ECHOCARDIOGRAM REPORT   Patient Name:   DRAVEN NATTER Date of Exam: 12/19/2022 Medical Rec #:  [id number]       Height:       71.0 in Accession #:    [id number]      Weight:       129.4 lb Date of Birth:  1934-05-02       BSA:          1.752 m Patient Age:    88 years        BP:            111/60 mmHg Patient Gender: M               HR:           121 bpm. Exam Location:  Inpatient Procedure: 2D Echo, Cardiac Doppler and Color Doppler Indications:    Pulmonary Embolus  History:        Patient has no prior history of Echocardiogram examinations.                 Arrythmias:Tachycardia, Signs/Symptoms:cough; Risk                 Factors:sepsis, LE ulcers.  Sonographer:    Wallie Char Referring Phys: 61 ROBERT S BYRUM  Sonographer Comments: Technically challenging study due to limited acoustic windows. IMPRESSIONS  1. Left ventricular ejection fraction, by estimation, is 55 to 60%. The left ventricle has normal function. The left ventricle has no regional wall motion abnormalities. Left ventricular diastolic parameters are consistent with Grade I diastolic dysfunction (impaired relaxation).  2. Right ventricular systolic function is normal. The right ventricular size is normal. Tricuspid regurgitation signal is inadequate for assessing PA pressure.  3. Moderate pericardial effusion. The pericardial effusion is anterior to the right ventricle.  4. The mitral valve is normal in structure. No evidence of mitral valve regurgitation. No evidence of mitral stenosis.  5. The aortic  valve is tricuspid. There is mild calcification of the aortic valve. Aortic valve regurgitation is not visualized. Aortic valve sclerosis/calcification is present, without any evidence of aortic stenosis.  6. The inferior vena cava is normal in size with greater than 50% respiratory variability, suggesting right atrial pressure of 3 mmHg. Conclusion(s)/Recommendation(s): There is Mylan Lengyel moderate-sized anterior pericardial effusion without evidence of overt tamponade. there is no RV strain. FINDINGS  Left Ventricle: Left ventricular ejection fraction, by estimation, is 55 to 60%. The left ventricle has normal function. The left ventricle has no regional wall motion abnormalities. The left ventricular internal cavity size was normal in  size. There is  no left ventricular hypertrophy. Left ventricular diastolic parameters are consistent with Grade I diastolic dysfunction (impaired relaxation). Right Ventricle: The right ventricular size is normal. No increase in right ventricular wall thickness. Right ventricular systolic function is normal. Tricuspid regurgitation signal is inadequate for assessing PA pressure. Left Atrium: Left atrial size was normal in size. Right Atrium: Right atrial size was normal in size. Pericardium: Keamber Macfadden moderately sized pericardial effusion is present. The pericardial effusion is anterior to the right ventricle. Mitral Valve: The mitral valve is normal in structure. No evidence of mitral valve regurgitation. No evidence of mitral valve stenosis. MV peak gradient, 1.9 mmHg. The mean mitral valve gradient is 1.0 mmHg. Tricuspid Valve: The tricuspid valve is normal in structure. Tricuspid valve regurgitation is trivial. No evidence of tricuspid stenosis. Aortic Valve: The aortic valve is tricuspid. There is mild calcification of the aortic valve. Aortic valve regurgitation is not visualized. Aortic valve sclerosis/calcification is present, without any evidence of aortic stenosis. Aortic valve mean gradient measures 2.0 mmHg. Aortic valve peak gradient measures 2.4 mmHg. Aortic valve area, by VTI measures 2.81 cm. Pulmonic Valve: The pulmonic valve was normal in structure. Pulmonic valve regurgitation is not visualized. No evidence of pulmonic stenosis. Aorta: The aortic root is normal in size and structure. Venous: The inferior vena cava is normal in size with greater than 50% respiratory variability, suggesting right atrial pressure of 3 mmHg. IAS/Shunts: No atrial level shunt detected by color flow Doppler.  LEFT VENTRICLE PLAX 2D LVIDd:         4.60 cm     Diastology LVIDs:         3.40 cm     LV e' medial:    6.73 cm/s LV PW:         1.00 cm     LV E/e' medial:  9.6 LV IVS:        0.70 cm     LV e' lateral:   7.50 cm/s  LVOT diam:     2.30 cm     LV E/e' lateral: 8.6 LV SV:         50 LV SV Index:   28 LVOT Area:     4.15 cm  LV Volumes (MOD) LV vol d, MOD A2C: 45.0 ml LV vol d, MOD A4C: 58.9 ml LV vol s, MOD A2C: 20.3 ml LV vol s, MOD A4C: 27.6 ml LV SV MOD A2C:     24.7 ml LV SV MOD A4C:     58.9 ml LV SV MOD BP:      29.1 ml RIGHT VENTRICLE             IVC RV Basal diam:  3.80 cm     IVC diam: 1.60 cm RV S prime:     26.10 cm/s TAPSE (M-mode): 2.0 cm LEFT ATRIUM  Index        RIGHT ATRIUM           Index LA diam:      3.20 cm 1.83 cm/m   RA Area:     14.70 cm LA Vol (A4C): 20.7 ml 11.81 ml/m  RA Volume:   36.40 ml  20.77 ml/m  AORTIC VALVE AV Area (Vmax):    2.83 cm AV Area (Vmean):   2.48 cm AV Area (VTI):     2.81 cm AV Vmax:           78.00 cm/s AV Vmean:          58.600 cm/s AV VTI:            0.177 m AV Peak Grad:      2.4 mmHg AV Mean Grad:      2.0 mmHg LVOT Vmax:         53.20 cm/s LVOT Vmean:        35.000 cm/s LVOT VTI:          0.120 m LVOT/AV VTI ratio: 0.68  AORTA Ao Root diam: 3.60 cm MITRAL VALVE               TRICUSPID VALVE MV Area (PHT): 3.39 cm    TR Peak grad:   27.0 mmHg MV Area VTI:   3.30 cm    TR Vmax:        260.00 cm/s MV Peak grad:  1.9 mmHg MV Mean grad:  1.0 mmHg    SHUNTS MV Vmax:       0.68 m/s    Systemic VTI:  0.12 m MV Vmean:      44.6 cm/s   Systemic Diam: 2.30 cm MV Decel Time: 224 msec MV E velocity: 64.60 cm/s MV Joliene Salvador velocity: 66.80 cm/s MV E/Anuradha Chabot ratio:  0.97 Arvilla Meres MD Electronically signed by Arvilla Meres MD Signature Date/Time: 12/19/2022/11:29:18 AM    Final         Scheduled Meds:  Chlorhexidine Gluconate Cloth  6 each Topical Daily   collagenase   Topical Daily   sodium hypochlorite   Irrigation BID   Continuous Infusions:  sodium chloride     sodium chloride     ampicillin (OMNIPEN) IV 2 g (12/21/22 0830)   cefTRIAXone (ROCEPHIN)  IV Stopped (12/21/22 9562)   heparin 1,400 Units/hr (12/21/22 0100)   lactated ringers     metronidazole  Stopped (12/20/22 2324)     LOS: 3 days    Time spent: over 30 min    Lacretia Nicks, MD [facility]   To contact the attending provider between 7A-7P or the covering provider during after hours 7P-7A, please log into the web site [email or web address] and access using universal [place] password for that web site. If you do not have the password, please call the hospital operator.  12/21/2022, 8:48 AM

## 2022-12-21 NOTE — Progress Notes (Signed)
Lower extremity venous bilateral study completed.  Preliminary results relayed to Powell, MD.  See CV Proc for preliminary results report.   Rachel Hodge, RDMS, RVT  

## 2022-12-21 NOTE — Progress Notes (Incomplete)
         [facility]  Date of Admission:  12/18/2022   Total days of inpatient antibiotics ***  Principal Problem:   Septic shock (HCC) Active Problems:   Dementia without behavioral disturbance (HCC)   Malnutrition of moderate degree   Chronic anemia   Pulmonary embolism (HCC)   Sacral osteomyelitis (HCC)   Gangrene with decubitus ulcer, stage 4 (HCC)   Leg ulcer, right, with necrosis of muscle (HCC)          Assessment: ***  Recommendations: ***   Microbiology:   Antibiotics:  Cultures: Blood  Urine  Other   SUBJECTIVE: *** Interval:   Review of Systems: ROS   Scheduled Meds:  Chlorhexidine Gluconate Cloth  6 each Topical Daily   sodium hypochlorite   Irrigation BID   Continuous Infusions:  sodium chloride     sodium chloride     ampicillin (OMNIPEN) IV 2 g (12/21/22 0830)   cefTRIAXone (ROCEPHIN)  IV Stopped (12/21/22 0649)   heparin 1,400 Units/hr (12/21/22 0100)   lactated ringers     metronidazole Stopped (12/20/22 2324)   PRN Meds:.Place/Maintain arterial line **AND** sodium chloride, docusate sodium, fentaNYL (SUBLIMAZE) injection, ondansetron (ZOFRAN) IV, mouth rinse, polyethylene glycol, Vashe Wound Irrigation [employer] Allergies  Allergen Reactions   Milk-Related Compounds Other (See Comments)    NO DAIRY, NO YOGURT, NO ICE CREAM, NO CHEESE, NO MILK (PREFERENCE)   Beef-Derived Products Other (See Comments)    Requested by wife.   Egg-Derived Products Other (See Comments)    Patient wife states they are vegetarian, eggs are not apart of their diet.    Other Other (See Comments)    Unnamed antibiotic- "Made him sick"- no further elaboration  Patient prefers to not take medication, in general    OBJECTIVE: Vitals:   12/21/22 0400 12/21/22 0500 12/21/22 0600 12/21/22 0816  BP: 111/70 108/71 96/60   Pulse: 86  84   Resp: (!) 21 15 14    Temp:  97.7 F (36.5 C)  (!) 96.4 F (35.8 C)  TempSrc:  Axillary   Axillary  SpO2: 93%  97%   Weight:  64.7 kg    Height:       Body mass index is 19.89 kg/m.  Physical Exam    Lab Results Lab Results  Component Value Date   WBC 16.6 (H) 12/21/2022   HGB 8.8 (L) 12/21/2022   HCT 27.9 (L) 12/21/2022   MCV 75.8 (L) 12/21/2022   PLT 171 12/21/2022    Lab Results  Component Value Date   CREATININE 0.60 (L) 12/21/2022   BUN 8 12/21/2022   NA 139 12/21/2022   K 3.8 12/21/2022   CL 109 12/21/2022   CO2 20 (L) 12/21/2022    Lab Results  Component Value Date   ALT 24 12/21/2022   AST 28 12/21/2022   ALKPHOS 59 12/21/2022   BILITOT 0.6 12/21/2022        Danelle Earthly, MD [facility] 12/21/2022, 8:36 AM

## 2022-12-21 NOTE — Progress Notes (Addendum)
12/21/2022  Edwin [name] [id number] 05/28/1934  CARE TEAM: PCP: [name], DO  Outpatient Care Team: Patient Care Team: [name], DO as PCP - General (Family Medicine)  Inpatient Treatment Team: Treatment Team:  Zigmund Daniel., MD Ccs, Md, MD Merlyn Albert, MD Philomena Doheny, RN Pricilla Riffle, RPH [place], Clide Dales, RN Oval Linsey, RN   Problem List:   Principal Problem:   Septic shock [facility]) Active Problems:   Dementia without behavioral disturbance [facility])   Malnutrition of moderate degree   Chronic anemia   Pulmonary embolism (HCC)   Sacral osteomyelitis (HCC)   Gangrene with decubitus ulcer, stage 4 (HCC)   Leg ulcer, right, with necrosis of muscle (HCC)   * No surgery found *      Assessment [facility] Stay = 3 days)      Stabilizing    Plan:  -Large sacral decubitus 20 x 15 cm region butterfly pattern with full-thickness gangrene.  Given his severe deconditioned bedridden state bedside debridement done.  Patient rolled left side down decubitus with ICU/wound ostomy nurse assistance.  Frank foul smell with black wet gangrene.  Elevated and some of it easily separated out at the skin edge auto demarcated.  Use sharp scissors to free skin dermis and subcutaneous fat and some necrotic gluteal muscle.  Coccyx exposed and right part of sacral bone exposed.  Crumbly and friable consistent with chronic osteomyelitis.  Held off on more aggressive debridement in that region.  Patient already has a Flexi-Seal rectal tube in place.  Some necrosis of muscle and periosteum but markedly improved.  Held off on more aggressive debridement given the patient fully anticoagulated on heparin and bedridden.  Got some mild oozing bleeding healthy tissue at the base.  Patient tolerated well.  Of note, patient with right lateral leg chronic wound 8 x 4 x 4 cm.  Necrotic skin and subcutaneous fat and some muscle sharply debrided with scissors  in a similar fashion.  Resulting wound and exposed muscle with moderately good granulation.  Can palpate ostium of fibula.  Held off on more aggressive debridement.  Left greater toe dry 1 cm gangrene ulcer in place. 1.  Progress note or procedure note with a detailed description of the procedure.  2.  Tool used for debridement (curette, scapel, etc.)  scissors  3.  Frequency of surgical debridement.   1st  4.  Measurement of total devitalized tissue (wound surface) before and after surgical debridement.   20x15cm  5.  Area and depth of devitalized tissue removed from wound.  19x14x3cm  6.  Blood loss and description of tissue removed.  1mL  7.  Evidence of the progress of the wound's response to treatment.  A.  Current wound volume (current dimensions and depth).  19x14x3cm  B.  Presence (and extent of) of infection.  Wet gangrene 90%  C.  Presence (and extent of) of non viable tissue.  50%  D.  Other material in the wound that is expected to inhibit healing.  Coccyx crumbling = osteomyelitis  8.  Was there any viable tissue removed (measurements): NS  -With the sacral wound, the wet gangrene is debrided.  There is necrotic tissue remaining but I do not think it is will be life-threatening for sepsis at this time and I do not want to be overly aggressive in this very elderly demented bedridden man.  I am skeptical that removing his necrotic coccygeal osteomyelitis and debriding of sacrum will help much.  This wound will never close and he is not a candidate for muscle flap reconstruction given his severely bedridden and demented and malnourished state.  That would fail.  Will transition to Dakin's solution quarter strength x 48 hours and then most likely saline.  See if Edwin [name] is available.  Would recommend wound ostomy counseling nurses to help follow with possible PT hydrotherapy to help gently clean up the wound with bedside debridement and avoid stressing him taken to the operating  room.  Right lower extremity wound with exposed muscle and bone not far away.  Would keep simple with saline and Coban wrap gently.  Do not think that we will need more aggressive debridement on the superficial area.  Would strongly recommend orthopedic consultation.  I worry that if this progresses the patient will require a BKA on that side.  I would favor piperacillin/tazobactam for soft tissue infections but will defer to infectious disease.  Overall I am very concerned about his severely deconditioned state in the setting of dementia.  Prognosis grim.  I agree with palliative care consult and goals of care.  All concerned that we are not prolonging suffering.  Surgery will follow peripherally but would avoid aggressive redebridements aside from some bedside hydrotherapy/wound care.         I reviewed nursing notes, hospitalist notes, last 24 h vitals and pain scores, last 48 h intake and output, last 24 h labs and trends, and last 24 h imaging results.  I have reviewed this patient's available data, including medical history, events of note, test results, etc as part of my evaluation.   A significant portion of that time was spent in counseling. Care during the described time interval was provided by me.  This care required high  level of medical decision making.  12/21/2022    Subjective: (Chief complaint)  Patient motoring at bedside.  Not very interactive or following commands.  ICU nurse at bedside.  Remains on full anticoagulation.  Not on any pressors.  Objective:  Vital signs:  Vitals:   12/21/22 0300 12/21/22 0400 12/21/22 0500 12/21/22 0600  BP: (!) 117/97 111/70 108/71 96/60  Pulse: 95 86  84  Resp: 16 (!) 21 15 14   Temp:   97.7 F (36.5 C)   TempSrc:   Axillary   SpO2: 100% 93%  97%  Weight:   64.7 kg   Height:        Last BM Date : 12/20/22  Intake/Output   Yesterday:  07/14 0701 - 07/15 0700 In: 1851.8 [I.V.:311.6; IV Piggyback:1540.2] Out:  450 [Urine:450] This shift:  No intake/output data recorded.  Bowel function:  Flatus: YES  BM:  YES  Drain: Flexi-Seal in place with decent seal with pasty stool in container.   Physical Exam:  General: Pt awake/alert in no acute distress Eyes: PERRL, normal EOM.  Sclera clear.  No icterus Neuro: CN II-XII intact w/o focal sensory/motor deficits. Lymph: No head/neck/groin lymphadenopathy Psych:  No delirium/agitation.  Oriented x 0 HENT: Normocephalic, Mucus membranes moist.  No thrush Neck: Supple, No tracheal deviation.  No obvious thyromegaly Chest: No pain to chest wall compression.  Good respiratory excursion.  No audible wheezing CV:  Pulses intact.  Regular rhythm.  No major extremity edema MS: Some rigidity at major joints but eventually moves with some AROM.  Except as noted below no other deformity Abdomen: Soft.  Nondistended.  Mildly tender at incisions only.  No evidence of peritonitis.  No incarcerated hernias. Ext:  See  below.  No deformity.  No mjr edema.  No cyanosis  Skin:  In lower back and upper buttock/intergluteal cleft is a 20 x 15 butterfly shaped area of black wet gangrene and foul order consistent with a necrotic sacral decubitus.  No surrounding cellulitis to the edges with some separation already occurring.  Aggressive debridement done as noted above.  Right leg left lateral longitudinal ellipsoid wound with muscle and some bone exposed.  Necrotic skin dermis subcutaneous tissue and muscle partially auto amputating and debrided as well.  See above.  No petechiae / purpurea.  No other sores.  Warm and dry    Results:   Cultures: Recent Results (from the past 720 hour(s))  Resp panel by RT-PCR (RSV, Flu A&B, Covid) Anterior Nasal Swab     Status: None   Collection Time: 11/29/22  6:58 PM   Specimen: Anterior Nasal Swab  Result Value Ref Range Status   SARS Coronavirus 2 by RT PCR NEGATIVE NEGATIVE Final    Comment: (NOTE) SARS-CoV-2 target  nucleic acids are NOT DETECTED.  The SARS-CoV-2 RNA is generally detectable in upper respiratory specimens during the acute phase of infection. The lowest concentration of SARS-CoV-2 viral copies this assay can detect is 138 copies/mL. A negative result does not preclude SARS-Cov-2 infection and should not be used as the sole basis for treatment or other patient management decisions. A negative result may occur with  improper specimen collection/handling, submission of specimen other than nasopharyngeal swab, presence of viral mutation(s) within the areas targeted by this assay, and inadequate number of viral copies(<138 copies/mL). A negative result must be combined with clinical observations, patient history, and epidemiological information. The expected result is Negative.  Fact Sheet for Patients:  BloggerCourse.com  Fact Sheet for Healthcare Providers:  SeriousBroker.it  This test is no t yet approved or cleared by the [place] FDA and  has been authorized for detection and/or diagnosis of SARS-CoV-2 by FDA under an Emergency Use Authorization (EUA). This EUA will remain  in effect (meaning this test can be used) for the duration of the COVID-19 declaration under Section 564(b)(1) of the Act, 21 U.S.C.section 360bbb-3(b)(1), unless the authorization is terminated  or revoked sooner.       Influenza A by PCR NEGATIVE NEGATIVE Final   Influenza B by PCR NEGATIVE NEGATIVE Final    Comment: (NOTE) The Xpert Xpress SARS-CoV-2/FLU/RSV plus assay is intended as an aid in the diagnosis of influenza from Nasopharyngeal swab specimens and should not be used as a sole basis for treatment. Nasal washings and aspirates are unacceptable for Xpert Xpress SARS-CoV-2/FLU/RSV testing.  Fact Sheet for Patients: BloggerCourse.com  Fact Sheet for Healthcare  Providers: SeriousBroker.it  This test is not yet approved or cleared by the [place] FDA and has been authorized for detection and/or diagnosis of SARS-CoV-2 by FDA under an Emergency Use Authorization (EUA). This EUA will remain in effect (meaning this test can be used) for the duration of the COVID-19 declaration under Section 564(b)(1) of the Act, 21 U.S.C. section 360bbb-3(b)(1), unless the authorization is terminated or revoked.     Resp Syncytial Virus by PCR NEGATIVE NEGATIVE Final    Comment: (NOTE) Fact Sheet for Patients: BloggerCourse.com  Fact Sheet for Healthcare Providers: SeriousBroker.it  This test is not yet approved or cleared by the [place] FDA and has been authorized for detection and/or diagnosis of SARS-CoV-2 by FDA under an Emergency Use Authorization (EUA). This EUA will remain in effect (meaning this test can  be used) for the duration of the COVID-19 declaration under Section 564(b)(1) of the Act, 21 U.S.C. section 360bbb-3(b)(1), unless the authorization is terminated or revoked.  Performed at [facility], [address] [place], [place] [address]   Blood Culture (routine x 2)     Status: Abnormal (Preliminary result)   Collection Time: 12/18/22  6:18 PM   Specimen: BLOOD RIGHT WRIST  Result Value Ref Range Status   Specimen Description   Final    BLOOD RIGHT WRIST Performed at [facility], [address], [place], [place] [address]    Special Requests   Final    BOTTLES DRAWN AEROBIC AND ANAEROBIC Blood Culture adequate volume Performed at [facility], [address], [place], [place] [address]    Culture  Setup Time   Final    GRAM NEGATIVE RODS GRAM POSITIVE COCCI IN BOTH AEROBIC AND ANAEROBIC BOTTLES CRITICAL RESULT CALLED TO, READ BACK BY AND VERIFIED WITH: PHARMD DREW WOFFORD [id number] 1232 BY J RAZZAK,  MT    Culture (A)  Final    ENTEROCOCCUS FAECALIS ESCHERICHIA COLI SUSCEPTIBILITIES TO FOLLOW Performed at [facility], [address] [place], [place] [address]    Report Status PENDING  Incomplete  Blood Culture (routine x 2)     Status: None (Preliminary result)   Collection Time: 12/18/22  6:18 PM   Specimen: BLOOD  Result Value Ref Range Status   Specimen Description   Final    BLOOD RIGHT ANTECUBITAL Performed at [facility], [address], [place], [place] [address]    Special Requests   Final    BOTTLES DRAWN AEROBIC AND ANAEROBIC Blood Culture adequate volume Performed at [facility], [address] [place], [place] [address]    Culture  Setup Time   Final    GRAM NEGATIVE RODS ANAEROBIC BOTTLE ONLY GRAM POSITIVE COCCI AND GNR AEROBIC BOTTLE ONLY CRITICAL VALUE NOTED.  VALUE IS CONSISTENT WITH PREVIOUSLY REPORTED AND CALLED VALUE.    Culture   Final    GRAM NEGATIVE RODS ENTEROCOCCUS FAECALIS IDENTIFICATION TO FOLLOW Performed at [facility], [address], [place], [place] [address]    Report Status PENDING  Incomplete  Blood Culture ID Panel (Reflexed)     Status: Abnormal   Collection Time: 12/18/22  6:18 PM  Result Value Ref Range Status   Enterococcus faecalis DETECTED (A) NOT DETECTED Final    Comment: CRITICAL RESULT CALLED TO, READ BACK BY AND VERIFIED WITH: PHARMD DREW WOFFORD [id number] 1232 BY J RAZZAK, MT    Enterococcus Faecium NOT DETECTED NOT DETECTED Final   Listeria monocytogenes NOT DETECTED NOT DETECTED Final   Staphylococcus species NOT DETECTED NOT DETECTED Final   Staphylococcus aureus (BCID) NOT DETECTED NOT DETECTED Final   Staphylococcus epidermidis NOT DETECTED NOT DETECTED Final   Staphylococcus lugdunensis NOT DETECTED NOT DETECTED Final   Streptococcus species NOT DETECTED NOT DETECTED Final   Streptococcus agalactiae NOT DETECTED NOT DETECTED Final   Streptococcus  pneumoniae NOT DETECTED NOT DETECTED Final   Streptococcus pyogenes NOT DETECTED NOT DETECTED Final   A.calcoaceticus-baumannii NOT DETECTED NOT DETECTED Final   Bacteroides fragilis NOT DETECTED NOT DETECTED Final   Enterobacterales DETECTED (A) NOT DETECTED Final    Comment: Enterobacterales represent a large order of gram negative bacteria, not a single organism. CRITICAL RESULT CALLED TO, READ BACK BY AND VERIFIED WITH: PHARMD DREW WOFFORD [id number] 1232 BY J RAZZAK, MT  Enterobacter cloacae complex NOT DETECTED NOT DETECTED Final   Escherichia coli DETECTED (A) NOT DETECTED Final    Comment: CRITICAL RESULT CALLED TO, READ BACK BY AND VERIFIED WITH: PHARMD DREW WOFFORD [id number] 1232 BY J RAZZAK, MT    Klebsiella aerogenes NOT DETECTED NOT DETECTED Final   Klebsiella oxytoca NOT DETECTED NOT DETECTED Final   Klebsiella pneumoniae NOT DETECTED NOT DETECTED Final   Proteus species NOT DETECTED NOT DETECTED Final   Salmonella species NOT DETECTED NOT DETECTED Final   Serratia marcescens NOT DETECTED NOT DETECTED Final   Haemophilus influenzae NOT DETECTED NOT DETECTED Final   Neisseria meningitidis NOT DETECTED NOT DETECTED Final   Pseudomonas aeruginosa NOT DETECTED NOT DETECTED Final   Stenotrophomonas maltophilia NOT DETECTED NOT DETECTED Final   Candida albicans NOT DETECTED NOT DETECTED Final   Candida auris NOT DETECTED NOT DETECTED Final   Candida glabrata NOT DETECTED NOT DETECTED Final   Candida krusei NOT DETECTED NOT DETECTED Final   Candida parapsilosis NOT DETECTED NOT DETECTED Final   Candida tropicalis NOT DETECTED NOT DETECTED Final   Cryptococcus neoformans/gattii NOT DETECTED NOT DETECTED Final   CTX-M ESBL NOT DETECTED NOT DETECTED Final   Carbapenem resistance IMP NOT DETECTED NOT DETECTED Final   Carbapenem resistance KPC NOT DETECTED NOT DETECTED Final   Carbapenem resistance NDM NOT DETECTED NOT DETECTED Final   Carbapenem resist OXA 48 LIKE NOT DETECTED  NOT DETECTED Final   Vancomycin resistance NOT DETECTED NOT DETECTED Final   Carbapenem resistance VIM NOT DETECTED NOT DETECTED Final    Comment: Performed at [facility], [address] [place], [place] [address]  MRSA Next Gen by PCR, Nasal     Status: None   Collection Time: 12/19/22  1:02 AM   Specimen: Nasal Mucosa; Nasal Swab  Result Value Ref Range Status   MRSA by PCR Next Gen NOT DETECTED NOT DETECTED Final    Comment: (NOTE) The GeneXpert MRSA Assay (FDA approved for NASAL specimens only), is one component of a comprehensive MRSA colonization surveillance program. It is not intended to diagnose MRSA infection nor to guide or monitor treatment for MRSA infections. Test performance is not FDA approved in patients less than 59 years old. Performed at [facility], [address], [place], [place] [address]   C Difficile Quick Screen w PCR reflex     Status: None   Collection Time: 12/19/22  2:16 PM   Specimen: STOOL  Result Value Ref Range Status   C Diff antigen NEGATIVE NEGATIVE Final   C Diff toxin NEGATIVE NEGATIVE Final   C Diff interpretation No C. difficile detected.  Final    Comment: Performed at [facility], [address], [place], [place] [address]    Labs: Results for orders placed or performed during the hospital encounter of 12/18/22 (from the past 48 hour(s))  Heparin level (unfractionated)     Status: None   Collection Time: 12/19/22  9:49 AM  Result Value Ref Range   Heparin Unfractionated 0.36 0.30 - 0.70 IU/mL    Comment: (NOTE) The clinical reportable range upper limit is being lowered to >1.10 to align with the FDA approved guidance for the current laboratory assay.  If heparin results are below expected values, and patient dosage has  been confirmed, suggest follow up testing of antithrombin III levels. Performed at [facility], [address] [place], [place] [address]   C Difficile  Quick Screen w PCR reflex  Status: None   Collection Time: 12/19/22  2:16 PM   Specimen: STOOL  Result Value Ref Range   C Diff antigen NEGATIVE NEGATIVE   C Diff toxin NEGATIVE NEGATIVE   C Diff interpretation No C. difficile detected.     Comment: Performed at [facility], [address], [place], [place] [address]  Heparin level (unfractionated)     Status: Abnormal   Collection Time: 12/19/22  6:49 PM  Result Value Ref Range   Heparin Unfractionated 0.23 (L) 0.30 - 0.70 IU/mL    Comment: (NOTE) The clinical reportable range upper limit is being lowered to >1.10 to align with the FDA approved guidance for the current laboratory assay.  If heparin results are below expected values, and patient dosage has  been confirmed, suggest follow up testing of antithrombin III levels. Performed at [facility], [address], [place], [place] [address]   CBC     Status: Abnormal   Collection Time: 12/20/22  3:37 AM  Result Value Ref Range   WBC 16.3 (H) 4.0 - 10.5 K/uL   RBC 3.43 (L) 4.22 - 5.81 MIL/uL   Hemoglobin 8.1 (L) 13.0 - 17.0 g/dL    Comment: Reticulocyte Hemoglobin testing may be clinically indicated, consider ordering this additional test [id number]    HCT 24.7 (L) 39.0 - 52.0 %   MCV 72.0 (L) 80.0 - 100.0 fL   MCH 23.6 (L) 26.0 - 34.0 pg   MCHC 32.8 30.0 - 36.0 g/dL   RDW 08.6 (H) 57.8 - 46.9 %   Platelets 140 (L) 150 - 400 K/uL    Comment: SPECIMEN CHECKED FOR CLOTS REPEATED TO VERIFY    nRBC 0.1 0.0 - 0.2 %    Comment: Performed at [facility], [address] [place], [place] [address]  Basic metabolic panel     Status: Abnormal   Collection Time: 12/20/22  3:37 AM  Result Value Ref Range   Sodium 140 135 - 145 mmol/L   Potassium 3.2 (L) 3.5 - 5.1 mmol/L   Chloride 110 98 - 111 mmol/L   CO2 23 22 - 32 mmol/L   Glucose, Bld 83 70 - 99 mg/dL    Comment: Glucose reference range applies only to samples taken after  fasting for at least 8 hours.   BUN 9 8 - 23 mg/dL   Creatinine, Ser 8.41 (L) 0.61 - 1.24 mg/dL   Calcium 8.2 (L) 8.9 - 10.3 mg/dL   GFR, Estimated >32 >44 mL/min    Comment: (NOTE) Calculated using the CKD-EPI Creatinine Equation (2021)    Anion gap 7 5 - 15    Comment: Performed at [facility], [address], [place], [place] [address]  Magnesium     Status: None   Collection Time: 12/20/22  3:37 AM  Result Value Ref Range   Magnesium 1.7 1.7 - 2.4 mg/dL    Comment: Performed at [facility], [address], [place], [place] [address]  Phosphorus     Status: None   Collection Time: 12/20/22  3:37 AM  Result Value Ref Range   Phosphorus 2.8 2.5 - 4.6 mg/dL    Comment: Performed at [facility], [address] [place], [place] [address]  Heparin level (unfractionated)     Status: Abnormal   Collection Time: 12/20/22  3:37 AM  Result Value Ref Range   Heparin Unfractionated 0.28 (L) 0.30 - 0.70 IU/mL    Comment: (  NOTE) The clinical reportable range upper limit is being lowered to >1.10 to align with the FDA approved guidance for the current laboratory assay.  If heparin results are below expected values, and patient dosage has  been confirmed, suggest follow up testing of antithrombin III levels. Performed at [facility], [address], [place], [place] [address]   Heparin level (unfractionated)     Status: None   Collection Time: 12/20/22  1:28 PM  Result Value Ref Range   Heparin Unfractionated 0.51 0.30 - 0.70 IU/mL    Comment: (NOTE) The clinical reportable range upper limit is being lowered to >1.10 to align with the FDA approved guidance for the current laboratory assay.  If heparin results are below expected values, and patient dosage has  been confirmed, suggest follow up testing of antithrombin III levels. Performed at [facility], [address], [place], [place] [address]   Heparin level (unfractionated)     Status: None   Collection Time: 12/20/22 11:27 PM  Result Value Ref Range   Heparin Unfractionated 0.66 0.30 - 0.70 IU/mL    Comment: (NOTE) The clinical reportable range upper limit is being lowered to >1.10 to align with the FDA approved guidance for the current laboratory assay.  If heparin results are below expected values, and patient dosage has  been confirmed, suggest follow up testing of antithrombin III levels. Performed at [facility], [address] [place], [place] [address]   CBC     Status: Abnormal   Collection Time: 12/21/22  5:14 AM  Result Value Ref Range   WBC 16.6 (H) 4.0 - 10.5 K/uL   RBC 3.68 (L) 4.22 - 5.81 MIL/uL   Hemoglobin 8.8 (L) 13.0 - 17.0 g/dL    Comment: Reticulocyte Hemoglobin testing may be clinically indicated, consider ordering this additional test [id number]    HCT 27.9 (L) 39.0 - 52.0 %   MCV 75.8 (L) 80.0 - 100.0 fL   MCH 23.9 (L) 26.0 - 34.0 pg   MCHC 31.5 30.0 - 36.0 g/dL   RDW 40.3 (H) 47.4 - 25.9 %   Platelets 171 150 - 400 K/uL   nRBC 0.2 0.0 - 0.2 %    Comment: Performed at [facility], [address], [place], [place] [address]  Heparin level (unfractionated)     Status: None   Collection Time: 12/21/22  5:14 AM  Result Value Ref Range   Heparin Unfractionated 0.69 0.30 - 0.70 IU/mL    Comment: (NOTE) The clinical reportable range upper limit is being lowered to >1.10 to align with the FDA approved guidance for the current laboratory assay.  If heparin results are below expected values, and patient dosage has  been confirmed, suggest follow up testing of antithrombin III levels. Performed at [facility], [address] [place], [place] [address]   Comprehensive metabolic panel     Status: Abnormal   Collection Time: 12/21/22  5:14 AM  Result Value Ref Range   Sodium 139 135 - 145 mmol/L    Potassium 3.8 3.5 - 5.1 mmol/L   Chloride 109 98 - 111 mmol/L   CO2 20 (L) 22 - 32 mmol/L   Glucose, Bld 55 (L) 70 - 99 mg/dL    Comment: Glucose reference range applies only to samples taken after fasting for at least 8 hours.   BUN 8 8 - 23 mg/dL   Creatinine, Ser 2.95 (L) 0.61 - 1.24 mg/dL  Calcium 8.4 (L) 8.9 - 10.3 mg/dL   Total Protein 5.4 (L) 6.5 - 8.1 g/dL   Albumin 1.7 (L) 3.5 - 5.0 g/dL   AST 28 15 - 41 U/L   ALT 24 0 - 44 U/L   Alkaline Phosphatase 59 38 - 126 U/L   Total Bilirubin 0.6 0.3 - 1.2 mg/dL   GFR, Estimated >13 >08 mL/min    Comment: (NOTE) Calculated using the CKD-EPI Creatinine Equation (2021)    Anion gap 10 5 - 15    Comment: Performed at [facility], [address] [place], [place] [address]  Magnesium     Status: None   Collection Time: 12/21/22  5:14 AM  Result Value Ref Range   Magnesium 2.3 1.7 - 2.4 mg/dL    Comment: Performed at [facility], [address], [place], [place] [address]  Phosphorus     Status: None   Collection Time: 12/21/22  5:14 AM  Result Value Ref Range   Phosphorus 2.5 2.5 - 4.6 mg/dL    Comment: Performed at [facility], [address] [place], [place] [address]    Imaging / Studies: CT FEMUR RIGHT WO CONTRAST  Result Date: 12/19/2022 CLINICAL DATA:  Right leg infection. Evaluate for necrotizing fasciitis. EXAM: CT OF THE LOWER RIGHT EXTREMITY WITHOUT CONTRAST TECHNIQUE: Multidetector CT imaging of the right thigh and lower leg was performed according to the standard protocol. RADIATION DOSE REDUCTION: This exam was performed according to the departmental dose-optimization program which includes automated exposure control, adjustment of the mA and/or kV according to patient size and/or use of iterative reconstruction technique. COMPARISON:  None Available. FINDINGS: Bones/Joint/Cartilage Prior right hip arthroplasty. 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 The distal aspect of the fracture remains nonunited. No new fracture or dislocation. Mild periosteal reaction along the lateral aspect of the mid to distal fibular diaphysis (series 7, image 203). Degenerative changes of the right knee. Symmetric trace to small bilateral knee joint effusions. Ligaments Ligaments are suboptimally evaluated by CT. Muscles and Tendons Grossly intact. Soft tissue Right thigh and lower leg soft tissue swelling. 6.6 cm soft tissue ulcer along the lateral aspect of the mid to distal lower leg with a few tiny foci of subcutaneous emphysema. The ulcer extends close to the outer cortex of the fibular shaft. No fluid collection or hematoma. No soft tissue mass. Please see same day CT abdomen pelvis report for intrapelvic findings. IMPRESSION: 1. 6.6 cm soft tissue ulcer along the lateral aspect of the mid to distal lower leg extending close to the underlying fibular diaphysis, with mild fibular periosteal reaction concerning for osteomyelitis. 2. No evidence of necrotizing fasciitis.  No definite abscess. 3. 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 The distal aspect of the fracture remains nonunited. Electronically Signed   By: Obie Dredge M.D.   On: 12/19/2022 12:13   CT TIBIA FIBULA RIGHT WO CONTRAST  Result Date: 12/19/2022 CLINICAL DATA:  Right leg infection. Evaluate for necrotizing fasciitis. EXAM: CT OF THE LOWER RIGHT EXTREMITY WITHOUT CONTRAST TECHNIQUE: Multidetector CT imaging of the right thigh and lower leg was performed according to the standard protocol. RADIATION DOSE REDUCTION: This exam was performed according to the departmental dose-optimization program which includes automated exposure control, adjustment of the mA and/or kV according to patient size and/or use of iterative  reconstruction technique. COMPARISON:  None Available. FINDINGS: Bones/Joint/Cartilage Prior right hip arthroplasty. 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 The distal aspect of the fracture remains nonunited. No new fracture or dislocation. Mild periosteal reaction along the lateral aspect of the mid to distal fibular diaphysis (series 7, image 203). Degenerative changes of the right knee. Symmetric trace to small bilateral knee joint effusions. Ligaments Ligaments are suboptimally evaluated by CT. Muscles and Tendons Grossly intact. Soft tissue Right thigh and lower leg soft tissue swelling. 6.6 cm soft tissue ulcer along the lateral aspect of the mid to distal lower leg with a few tiny foci of subcutaneous emphysema. The ulcer extends close to the outer cortex of the fibular shaft. No fluid collection or hematoma. No soft tissue mass. Please see same day CT abdomen pelvis report for intrapelvic findings. IMPRESSION: 1. 6.6 cm soft tissue ulcer along the lateral aspect of the mid to distal lower leg extending close to the underlying fibular diaphysis, with mild fibular periosteal reaction concerning for osteomyelitis. 2. No evidence of necrotizing fasciitis.  No definite abscess. 3. 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 The distal aspect of the fracture remains nonunited. Electronically Signed   By: Obie Dredge M.D.   On: 12/19/2022 12:13   CT ABDOMEN PELVIS WO CONTRAST  Result Date: 12/19/2022 CLINICAL DATA:  decubitus ulcer, osteomyelitis EXAM: CT ABDOMEN AND PELVIS WITHOUT CONTRAST TECHNIQUE: Multidetector CT imaging of the abdomen and pelvis was performed following the standard protocol without IV contrast. RADIATION DOSE REDUCTION: This exam was performed according to the departmental dose-optimization  program which includes automated exposure control, adjustment of the mA and/or kV according to patient size and/or use of iterative reconstruction technique. COMPARISON:  CT chest 12/18/2022. FINDINGS: Lower chest: Trace left pleural effusion with left greater than right bibasilar airspace opacities, slightly progressed. Small-moderate pericardial effusion, unchanged. Hepatobiliary: Two cysts in the right hepatic lobe, largest measuring up to 3.0 cm. Otherwise unremarkable unenhanced appearance of the liver. Layering hyperdense material within the gallbladder is likely related to vicarious contrast excretion. Pancreas: Unremarkable. No pancreatic ductal dilatation or surrounding inflammatory changes. Spleen: Normal in size without focal abnormality. Adrenals/Urinary Tract: Unremarkable adrenal glands. Bilateral renal cysts, which do not require follow-up imaging. There is contrast excretion within the bilateral renal collecting systems and urinary bladder. No hydronephrosis. Stomach/Bowel: Stomach within normal limits. No dilated loops of bowel to suggest obstruction. Extensive diverticulosis. No focal bowel wall thickening is evident. A rectal tube is in place. Vascular/Lymphatic: Scattered aortoiliac atherosclerotic calcifications without aneurysm. No abdominopelvic lymphadenopathy. Reproductive: Prostate is unremarkable. Other: No free air or free fluid.  Mild mesenteric edema. Musculoskeletal: Sacral decubitus ulcer overlying the distal sacrum and coccyx. Loss of cortical definition of the posterior cortex of the distal most sacral segment suspicious for osteomyelitis (series 2, image 69). There is soft tissue edema and numerous foci of air within the soft tissues adjacent to the ulceration site. No organized or drainable fluid collections. Ankylosis of the imaged thoracolumbar spine. Large anterior endplate osteophyte at L3-L4. Both SI joints are fused. No acute fractures are identified. Prior right total hip  arthroplasty with proximal cerclage wires. IMPRESSION: 1. Sacral decubitus ulcer overlying the distal sacrum and coccyx. Loss of cortical definition of the posterior cortex of the distal-most sacral segment suggestive of osteomyelitis. There is soft tissue edema and numerous foci of air within the soft  tissues adjacent to the ulceration site. No organized or drainable fluid collections. 2. Trace left pleural effusion with left greater than right bibasilar airspace opacities, slightly progressed. 3. Small-moderate pericardial effusion, unchanged. 4. Extensive diverticulosis without evidence of acute diverticulitis. 5. 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 6. Aortic atherosclerosis (ICD10-I70.0). Electronically Signed   By: Duanne Guess D.O.   On: 12/19/2022 12:05   ECHOCARDIOGRAM COMPLETE  Result Date: 12/19/2022    ECHOCARDIOGRAM REPORT   Patient Name:   HIRAM MCIVER Date of Exam: 12/19/2022 Medical Rec #:  [id number]       Height:       71.0 in Accession #:    [id number]      Weight:       129.4 lb Date of Birth:  July 16, 1933       BSA:          1.752 m Patient Age:    88 years        BP:           111/60 mmHg Patient Gender: M               HR:           121 bpm. Exam Location:  Inpatient Procedure: 2D Echo, Cardiac Doppler and Color Doppler Indications:    Pulmonary Embolus  History:        Patient has no prior history of Echocardiogram examinations.                 Arrythmias:Tachycardia, Signs/Symptoms:cough; Risk                 Factors:sepsis, LE ulcers.  Sonographer:    Wallie Char Referring Phys: 66 ROBERT S BYRUM  Sonographer Comments: Technically challenging study due to limited acoustic windows. IMPRESSIONS  1. Left ventricular ejection fraction, by estimation, is 55 to 60%. The left ventricle has normal function. The left ventricle has no regional wall motion abnormalities. Left ventricular diastolic parameters are consistent with Grade I diastolic dysfunction (impaired  relaxation).  2. Right ventricular systolic function is normal. The right ventricular size is normal. Tricuspid regurgitation signal is inadequate for assessing PA pressure.  3. Moderate pericardial effusion. The pericardial effusion is anterior to the right ventricle.  4. The mitral valve is normal in structure. No evidence of mitral valve regurgitation. No evidence of mitral stenosis.  5. The aortic valve is tricuspid. There is mild calcification of the aortic valve. Aortic valve regurgitation is not visualized. Aortic valve sclerosis/calcification is present, without any evidence of aortic stenosis.  6. The inferior vena cava is normal in size with greater than 50% respiratory variability, suggesting right atrial pressure of 3 mmHg. Conclusion(s)/Recommendation(s): There is a moderate-sized anterior pericardial effusion without evidence of overt tamponade. there is no RV strain. FINDINGS  Left Ventricle: Left ventricular ejection fraction, by estimation, is 55 to 60%. The left ventricle has normal function. The left ventricle has no regional wall motion abnormalities. The left ventricular internal cavity size was normal in size. There is  no left ventricular hypertrophy. Left ventricular diastolic parameters are consistent with Grade I diastolic dysfunction (impaired relaxation). Right Ventricle: The right ventricular size is normal. No increase in right ventricular wall thickness. Right ventricular systolic function is normal. Tricuspid regurgitation signal is inadequate for assessing PA pressure. Left Atrium: Left atrial size was normal in size. Right Atrium: Right atrial size was normal in size. Pericardium: A moderately sized pericardial effusion is present. The pericardial effusion  is anterior to the right ventricle. Mitral Valve: The mitral valve is normal in structure. No evidence of mitral valve regurgitation. No evidence of mitral valve stenosis. MV peak gradient, 1.9 mmHg. The mean mitral valve gradient  is 1.0 mmHg. Tricuspid Valve: The tricuspid valve is normal in structure. Tricuspid valve regurgitation is trivial. No evidence of tricuspid stenosis. Aortic Valve: The aortic valve is tricuspid. There is mild calcification of the aortic valve. Aortic valve regurgitation is not visualized. Aortic valve sclerosis/calcification is present, without any evidence of aortic stenosis. Aortic valve mean gradient measures 2.0 mmHg. Aortic valve peak gradient measures 2.4 mmHg. Aortic valve area, by VTI measures 2.81 cm. Pulmonic Valve: The pulmonic valve was normal in structure. Pulmonic valve regurgitation is not visualized. No evidence of pulmonic stenosis. Aorta: The aortic root is normal in size and structure. Venous: The inferior vena cava is normal in size with greater than 50% respiratory variability, suggesting right atrial pressure of 3 mmHg. IAS/Shunts: No atrial level shunt detected by color flow Doppler.  LEFT VENTRICLE PLAX 2D LVIDd:         4.60 cm     Diastology LVIDs:         3.40 cm     LV e' medial:    6.73 cm/s LV PW:         1.00 cm     LV E/e' medial:  9.6 LV IVS:        0.70 cm     LV e' lateral:   7.50 cm/s LVOT diam:     2.30 cm     LV E/e' lateral: 8.6 LV SV:         50 LV SV Index:   28 LVOT Area:     4.15 cm  LV Volumes (MOD) LV vol d, MOD A2C: 45.0 ml LV vol d, MOD A4C: 58.9 ml LV vol s, MOD A2C: 20.3 ml LV vol s, MOD A4C: 27.6 ml LV SV MOD A2C:     24.7 ml LV SV MOD A4C:     58.9 ml LV SV MOD BP:      29.1 ml RIGHT VENTRICLE             IVC RV Basal diam:  3.80 cm     IVC diam: 1.60 cm RV S prime:     26.10 cm/s TAPSE (M-mode): 2.0 cm LEFT ATRIUM           Index        RIGHT ATRIUM           Index LA diam:      3.20 cm 1.83 cm/m   RA Area:     14.70 cm LA Vol (A4C): 20.7 ml 11.81 ml/m  RA Volume:   36.40 ml  20.77 ml/m  AORTIC VALVE AV Area (Vmax):    2.83 cm AV Area (Vmean):   2.48 cm AV Area (VTI):     2.81 cm AV Vmax:           78.00 cm/s AV Vmean:          58.600 cm/s AV VTI:             0.177 m AV Peak Grad:      2.4 mmHg AV Mean Grad:      2.0 mmHg LVOT Vmax:         53.20 cm/s LVOT Vmean:        35.000 cm/s LVOT VTI:  0.120 m LVOT/AV VTI ratio: 0.68  AORTA Ao Root diam: 3.60 cm MITRAL VALVE               TRICUSPID VALVE MV Area (PHT): 3.39 cm    TR Peak grad:   27.0 mmHg MV Area VTI:   3.30 cm    TR Vmax:        260.00 cm/s MV Peak grad:  1.9 mmHg MV Mean grad:  1.0 mmHg    SHUNTS MV Vmax:       0.68 m/s    Systemic VTI:  0.12 m MV Vmean:      44.6 cm/s   Systemic Diam: 2.30 cm MV Decel Time: 224 msec MV E velocity: 64.60 cm/s MV A velocity: 66.80 cm/s MV E/A ratio:  0.97 Arvilla Meres MD Electronically signed by Arvilla Meres MD Signature Date/Time: 12/19/2022/11:29:18 AM    Final     Medications / Allergies: per chart  Antibiotics: Anti-infectives (From admission, onward)    Start     Dose/Rate Route Frequency Ordered Stop   12/20/22 0000  vancomycin (VANCOREADY) IVPB 1250 mg/250 mL  Status:  Discontinued        1,250 mg 166.7 mL/hr over 90 Minutes Intravenous Every 24 hours 12/19/22 0034 12/19/22 0920   12/19/22 2200  metroNIDAZOLE (FLAGYL) IVPB 500 mg        500 mg 100 mL/hr over 60 Minutes Intravenous 2 times daily 12/19/22 1715     12/19/22 2000  ampicillin (OMNIPEN) 2 g in sodium chloride 0.9 % 100 mL IVPB        2 g 300 mL/hr over 20 Minutes Intravenous Every 4 hours 12/19/22 1715     12/19/22 1815  cefTRIAXone (ROCEPHIN) 2 g in sodium chloride 0.9 % 100 mL IVPB        2 g 200 mL/hr over 30 Minutes Intravenous 2 times daily 12/19/22 1715     12/19/22 1030  piperacillin-tazobactam (ZOSYN) IVPB 3.375 g  Status:  Discontinued        3.375 g 12.5 mL/hr over 240 Minutes Intravenous Every 8 hours 12/19/22 1015 12/19/22 1715   12/19/22 0800  ceFEPIme (MAXIPIME) 2 g in sodium chloride 0.9 % 100 mL IVPB  Status:  Discontinued        2 g 200 mL/hr over 30 Minutes Intravenous Every 8 hours 12/19/22 0034 12/19/22 0920   12/18/22 2230  ceFEPIme (MAXIPIME) 2  g in sodium chloride 0.9 % 100 mL IVPB        2 g 200 mL/hr over 30 Minutes Intravenous  Once 12/18/22 2222 12/18/22 2321   12/18/22 2230  metroNIDAZOLE (FLAGYL) IVPB 500 mg        500 mg 100 mL/hr over 60 Minutes Intravenous  Once 12/18/22 2222 12/19/22 0010   12/18/22 2230  vancomycin (VANCOCIN) IVPB 1000 mg/200 mL premix        1,000 mg 200 mL/hr over 60 Minutes Intravenous  Once 12/18/22 2222 12/19/22 0122         Note: Portions of this report may have been transcribed using voice recognition software. Every effort was made to ensure accuracy; however, inadvertent computerized transcription errors may be present.   Any transcriptional errors that result from this process are unintentional.    Ardeth Sportsman, MD, FACS, MASCRS Esophageal, Gastrointestinal & Colorectal Surgery Robotic and Minimally Invasive Surgery  [facility] [address] [place], [place] [address] [phone] Fax [phone]  Main  CONTACT INFORMATION:  Weekday (9AM-5PM): Call [facility] main office at [phone]  Weeknight (5PM-9AM) or Weekend/Holiday: Check [email or web address] (password " [email or web address]") for General Surgery [facility] coverage  (Please, do not use SecureChat as it is not reliable communication to reach operating surgeons for immediate patient care given surgeries/outpatient duties/clinic/cross-coverage/off post-call which would lead to a delay in care.  Epic staff messaging available for outptient concerns, but may not be answered for 48 hours or more).     12/21/2022  7:59 AM

## 2022-12-21 NOTE — TOC Progression Note (Addendum)
Transition of Care [facility]) - Progression Note    Patient Details  Name: Edwin [name] MRN: [id number] Date of Birth: January 24, 1934  Transition of [facility]) CM/SW Contact  Geni Bers, RN Phone Number: 12/21/2022, 1:42 PM  Clinical Narrative:     Spoke with pt's wife Mrs. Pike concerning Hospice. Mrs. Cowin asked for [facility]. Referral given to [facility] to in house rep.        Expected Discharge Plan and Services                                               Social Determinants of Health (SDOH) Interventions SDOH Screenings   Tobacco Use: Low Risk  (12/18/2022)    Readmission Risk Interventions     No data to display

## 2022-12-22 DIAGNOSIS — R6521 Severe sepsis with septic shock: Secondary | ICD-10-CM | POA: Diagnosis not present

## 2022-12-22 DIAGNOSIS — A419 Sepsis, unspecified organism: Secondary | ICD-10-CM | POA: Diagnosis not present

## 2022-12-22 LAB — CULTURE, BLOOD (ROUTINE X 2): Special Requests: ADEQUATE

## 2022-12-22 NOTE — Progress Notes (Signed)
[facility]) Hospital Liaison Note   Spouse reporting that she cannot care for him and she needs someone there 'for two hours' to care for him. Extensive education provided of visit frequency & length provided by [facility] staff  MSW offered  or BP assessment & to do a financial assessment but spouse refused reporting that her main focus is talking to the doctor to see what he recommends regarding caregiver support in the home. She has requested to meet MSW me at bedside tomorrow @ 2.   [facility] information and contact numbers given to family & above information shared with MD.   Please call with any questions/concerns.    Thank you for the opportunity to participate in this patient's care.   Eugenie Birks, MSW [facility] Liaison  [phone]

## 2022-12-22 NOTE — Progress Notes (Addendum)
PROGRESS NOTE    Edwin [name]  ZOX:[id number] DOB: 1933/12/02 DOA: 12/18/2022 PCP: [name], DO  Chief Complaint  Patient presents with   Shortness of Breath   Cough    Brief Narrative:   89 year old man with history of dementia, right hip fracture/hemiarthroplasty 05/2022, bedridden and nonverbal at baseline. Family is able to assist him into Daquarius Dubeau wheelchair intermittently and he has home health assistance. He has reportedly had ongoing cough for Moxie Kalil few weeks. There was concern that he was worsening and EMS was called.   He was admitted for sepsis.  Found to have polymicrobial bacteremia, osteomyelitis, decubitus ulcers, possible endocarditis.  After discussion with wife, now planning for comfort measures with hospice.   Assessment & Plan:   Principal Problem:   Septic shock (HCC) Active Problems:   Dementia without behavioral disturbance (HCC)   Malnutrition of moderate degree   Chronic anemia   Pulmonary embolism (HCC)   Sacral osteomyelitis (HCC)   Gangrene with decubitus ulcer, stage 4 (HCC)   Leg ulcer, right, with necrosis of muscle (HCC)  Goals of care Planning for comfort measures with hospice.  Unclear whether this will be home hospice or inpatient hospice.  Currently discussing with hospice/wife.  Addendum 4:14 PM we're trying to figure out where Edwin [name] will go, at this time, looks like home with hospice most likely (Edwin [name] concerned regarding possible costs of [facility]).  Though Edwin [name] was clear yesterday in her desire that Edwin [name] be kept comfortable in his current condition and agreed to plan for hospice/comfort care, she asked some questions today that to me show that she still needs additional support with understand goals of hospice and end of life care.  Today reviewed I suspect prognosis is days to weeks.  Discussed his transfer to the floor, she was upset with this possibility and said he needed ICU care.  I discussed with Mrs.  [name] that Edwin [name] will be moved to the general floor during this hospitalization, but that our focus is on his comfort.  Septic Shock due to Polymicrobial Bacteremia  Osteomyelitis  Presumed Endocarditis  Sacral and Right Lower Extremity Decubitus Ulcers Imaging with 6.6 cm soft tissue ulcer along lateral aspect of mid to distal lower leg extending close to the underlying fibular diaphysis with mild fibular periosteal reaction concerning for osteo, no evidence nec fasc.  Prior hip arthroplasty with chronic periprosthetic fracture.  Sacral decubitus ulcer overlying the sacrum and coccyx, loss of cortical definition of posterior cortex of distal most sacral segment suggestive of osteomyelitis Cultures growing E. Coli, E Faecalis, and morganella morganii  Appreciate ID recs -> concern for endocarditis in setting of polymicrobial bacteremia with bilateral PE TTE with normal MV, aortic valve sclerosis, tricuspid valve normal in structure, pulmonic valve normal in structure initially planned to treat empirically for endocarditis/osteomyelitis (defer TEE) Appreciate surgery - s/p bedside debridement of the sacral decubitus and right lower extremity wound - appreciate wound care recs  Now plan for comfort measures as above  Acute Pulmonary Embolism  Heparin gtt stopped with plan for comfort measures Echo with normal RV size, EF 55-60%, moderate sized anterior pericardial effusion, no RV strain  LE [place] with bilateral LE DVT's  Hypoglycemia Plan to focus on comfort with end of life care  Pericardial Effusion No evidence tamponade  Trace Pleural Effusions Plan at this time for comfort and hospice  Dementia Comfort measures Delirium precautions  Acute Metabolic Encephalopathy Due to dementia/infection Comfort measures  Right  Radial Art line discontinued  Fecal Management System Continue to monitor output      DVT prophylaxis: heparin gtt Code Status: full Family  Communication: discussion with wife on 7/15 Disposition:   Status is: Inpatient Remains inpatient appropriate because: continued need for inpatient care   Consultants:  Pccm ID  Procedures:  Echo IMPRESSIONS     1. Left ventricular ejection fraction, by estimation, is 55 to 60%. The  left ventricle has normal function. The left ventricle has no regional  wall motion abnormalities. Left ventricular diastolic parameters are  consistent with Grade I diastolic  dysfunction (impaired relaxation).   2. Right ventricular systolic function is normal. The right ventricular  size is normal. Tricuspid regurgitation signal is inadequate for assessing  PA pressure.   3. Moderate pericardial effusion. The pericardial effusion is anterior to  the right ventricle.   4. The mitral valve is normal in structure. No evidence of mitral valve  regurgitation. No evidence of mitral stenosis.   5. The aortic valve is tricuspid. There is mild calcification of the  aortic valve. Aortic valve regurgitation is not visualized. Aortic valve  sclerosis/calcification is present, without any evidence of aortic  stenosis.   6. The inferior vena cava is normal in size with greater than 50%  respiratory variability, suggesting right atrial pressure of 3 mmHg.   Conclusion(s)/Recommendation(s): There is Edwin [name] moderate-sized anterior  pericardial effusion without evidence of overt tamponade. there is no RV  strain.   Antimicrobials:  Anti-infectives (From admission, onward)    Start     Dose/Rate Route Frequency Ordered Stop   12/20/22 0000  vancomycin (VANCOREADY) IVPB 1250 mg/250 mL  Status:  Discontinued        1,250 mg 166.7 mL/hr over 90 Minutes Intravenous Every 24 hours 12/19/22 0034 12/19/22 0920   12/19/22 2200  metroNIDAZOLE (FLAGYL) IVPB 500 mg  Status:  Discontinued        500 mg 100 mL/hr over 60 Minutes Intravenous 2 times daily 12/19/22 1715 12/21/22 1331   12/19/22 2000  ampicillin (OMNIPEN) 2 g  in sodium chloride 0.9 % 100 mL IVPB  Status:  Discontinued        2 g 300 mL/hr over 20 Minutes Intravenous Every 4 hours 12/19/22 1715 12/21/22 1331   12/19/22 1815  cefTRIAXone (ROCEPHIN) 2 g in sodium chloride 0.9 % 100 mL IVPB  Status:  Discontinued        2 g 200 mL/hr over 30 Minutes Intravenous 2 times daily 12/19/22 1715 12/21/22 1331   12/19/22 1030  piperacillin-tazobactam (ZOSYN) IVPB 3.375 g  Status:  Discontinued        3.375 g 12.5 mL/hr over 240 Minutes Intravenous Every 8 hours 12/19/22 1015 12/19/22 1715   12/19/22 0800  ceFEPIme (MAXIPIME) 2 g in sodium chloride 0.9 % 100 mL IVPB  Status:  Discontinued        2 g 200 mL/hr over 30 Minutes Intravenous Every 8 hours 12/19/22 0034 12/19/22 0920   12/18/22 2230  ceFEPIme (MAXIPIME) 2 g in sodium chloride 0.9 % 100 mL IVPB        2 g 200 mL/hr over 30 Minutes Intravenous  Once 12/18/22 2222 12/18/22 2321   12/18/22 2230  metroNIDAZOLE (FLAGYL) IVPB 500 mg        500 mg 100 mL/hr over 60 Minutes Intravenous  Once 12/18/22 2222 12/19/22 0010   12/18/22 2230  vancomycin (VANCOCIN) IVPB 1000 mg/200 mL premix  1,000 mg 200 mL/hr over 60 Minutes Intravenous  Once 12/18/22 2222 12/19/22 0122       Subjective: Nonverbal   Objective: Vitals:   12/22/22 0710 12/22/22 0835 12/22/22 0837 12/22/22 0839  BP:  (!) 142/110    Pulse: 85  91   Resp: 15   17  Temp:      TempSrc:      SpO2: 93%  100%   Weight:      Height:        Intake/Output Summary (Last 24 hours) at 12/22/2022 0930 Last data filed at 12/21/2022 1415 Gross per 24 hour  Intake 877.55 ml  Output --  Net 877.55 ml   Filed Weights   12/19/22 0115 12/20/22 0358 12/21/22 0500  Weight: 58.7 kg 64.7 kg 64.7 kg    Examination  Limited exam for comfort General: No acute distress. Chronically ill appearing  Lungs: unlabored Neurological: nonverbal   Data Reviewed: I have personally reviewed following labs and imaging studies  CBC: Recent Labs   Lab 12/18/22 1807 12/19/22 0325 12/20/22 0337 12/21/22 0514  WBC 9.1 24.0* 16.3* 16.6*  NEUTROABS 8.4*  --   --   --   HGB 9.4* 8.2* 8.1* 8.8*  HCT 29.2* 25.5* 24.7* 27.9*  MCV 74.9* 76.8* 72.0* 75.8*  PLT 153 129* 140* 171    Basic Metabolic Panel: Recent Labs  Lab 12/18/22 1807 12/19/22 0325 12/20/22 0337 12/21/22 0514  NA 137 139 140 139  K 3.1* 3.0* 3.2* 3.8  CL 104 108 110 109  CO2 24 22 23  20*  GLUCOSE 111* 98 83 55*  BUN 11 11 9 8   CREATININE 0.81 0.61 0.40* 0.60*  CALCIUM 9.5 8.9 8.2* 8.4*  MG  --  2.1 1.7 2.3  PHOS  --  1.6* 2.8 2.5    GFR: Estimated Creatinine Clearance: 60.7 mL/min (Edwin [name]) (by C-G formula based on SCr of 0.6 mg/dL (L)).  Liver Function Tests: Recent Labs  Lab 12/18/22 1807 12/19/22 0325 12/21/22 0514  AST 43* 42* 28  ALT 28 26 24   ALKPHOS 73 63 59  BILITOT 1.0 0.8 0.6  PROT 7.2 5.2* 5.4*  ALBUMIN 2.2* 1.6* 1.7*    CBG: Recent Labs  Lab 12/21/22 1117 12/21/22 1143  GLUCAP 46* 83     Recent Results (from the past 240 hour(s))  Blood Culture (routine x 2)     Status: Abnormal   Collection Time: 12/18/22  6:18 PM   Specimen: BLOOD RIGHT WRIST  Result Value Ref Range Status   Specimen Description   Final    BLOOD RIGHT WRIST Performed at [facility], [address], [place], [place] [address]    Special Requests   Final    BOTTLES DRAWN AEROBIC AND ANAEROBIC Blood Culture adequate volume Performed at [facility], [address], [place], [place] [address]    Culture  Setup Time   Final    GRAM NEGATIVE RODS GRAM POSITIVE COCCI IN BOTH AEROBIC AND ANAEROBIC BOTTLES CRITICAL RESULT CALLED TO, READ BACK BY AND VERIFIED WITH: Edwin [name] [id number] 1232 BY Berline Chough, MT Performed at [facility], [address] [place], [place] [address]    Culture (Creedence Heiss)  Final    ENTEROCOCCUS FAECALIS ESCHERICHIA COLI MORGANELLA MORGANII    Report Status 12/22/2022 FINAL  Final   Organism ID,  Bacteria ENTEROCOCCUS FAECALIS  Final   Organism ID, Bacteria ESCHERICHIA COLI  Final   Organism ID, Bacteria MORGANELLA MORGANII  Final      Susceptibility   Escherichia coli - MIC*    AMPICILLIN 4 SENSITIVE Sensitive     CEFEPIME <=0.12 SENSITIVE Sensitive     CEFTAZIDIME <=1 SENSITIVE Sensitive     CEFTRIAXONE <=0.25 SENSITIVE Sensitive     CIPROFLOXACIN <=0.25 SENSITIVE Sensitive     GENTAMICIN <=1 SENSITIVE Sensitive     IMIPENEM <=0.25 SENSITIVE Sensitive     TRIMETH/SULFA >=320 RESISTANT Resistant     AMPICILLIN/SULBACTAM <=2 SENSITIVE Sensitive     PIP/TAZO <=4 SENSITIVE Sensitive     * ESCHERICHIA COLI   Enterococcus faecalis - MIC*    AMPICILLIN <=2 SENSITIVE Sensitive     VANCOMYCIN 2 SENSITIVE Sensitive     GENTAMICIN SYNERGY SENSITIVE Sensitive     * ENTEROCOCCUS FAECALIS   Morganella morganii - MIC*    AMPICILLIN >=32 RESISTANT Resistant     CEFTAZIDIME <=1 SENSITIVE Sensitive     CIPROFLOXACIN <=0.25 SENSITIVE Sensitive     GENTAMICIN <=1 SENSITIVE Sensitive     IMIPENEM 1 SENSITIVE Sensitive     TRIMETH/SULFA <=20 SENSITIVE Sensitive     AMPICILLIN/SULBACTAM 16 INTERMEDIATE Intermediate     PIP/TAZO <=4 SENSITIVE Sensitive     * MORGANELLA MORGANII  Blood Culture (routine x 2)     Status: Abnormal   Collection Time: 12/18/22  6:18 PM   Specimen: BLOOD  Result Value Ref Range Status   Specimen Description   Final    BLOOD RIGHT ANTECUBITAL Performed at [facility], [address] [place], [place] [address]    Special Requests   Final    BOTTLES DRAWN AEROBIC AND ANAEROBIC Blood Culture adequate volume Performed at [facility], [address] [place], [place] [address]    Culture  Setup Time   Final    GRAM NEGATIVE RODS ANAEROBIC BOTTLE ONLY GRAM POSITIVE COCCI AND GNR AEROBIC BOTTLE ONLY CRITICAL VALUE NOTED.  VALUE IS CONSISTENT WITH PREVIOUSLY REPORTED AND CALLED VALUE.    Culture (Bessie Livingood)  Final    ESCHERICHIA  COLI ENTEROCOCCUS FAECALIS MORGANELLA MORGANII SUSCEPTIBILITIES PERFORMED ON PREVIOUS CULTURE WITHIN THE LAST 5 DAYS. Performed at [facility], [address], [place], [place] [address]    Report Status 12/22/2022 FINAL  Final  Blood Culture ID Panel (Reflexed)     Status: Abnormal   Collection Time: 12/18/22  6:18 PM  Result Value Ref Range Status   Enterococcus faecalis DETECTED (Tamikka Pilger) NOT DETECTED Final    Comment: CRITICAL RESULT CALLED TO, READ BACK BY AND VERIFIED WITH: PHARMD DREW WOFFORD [id number] 1232 BY J RAZZAK, MT    Enterococcus Faecium NOT DETECTED NOT DETECTED Final   Listeria monocytogenes NOT DETECTED NOT DETECTED Final   Staphylococcus species NOT DETECTED NOT DETECTED Final   Staphylococcus aureus (BCID) NOT DETECTED NOT DETECTED Final   Staphylococcus epidermidis NOT DETECTED NOT DETECTED Final   Staphylococcus lugdunensis NOT DETECTED NOT DETECTED Final   Streptococcus species NOT DETECTED NOT DETECTED Final   Streptococcus agalactiae NOT DETECTED NOT DETECTED Final   Streptococcus pneumoniae NOT DETECTED NOT DETECTED Final   Streptococcus pyogenes NOT DETECTED NOT DETECTED Final   [place]a [place].calcoaceticus-baumannii NOT DETECTED NOT DETECTED Final   Bacteroides fragilis NOT DETECTED NOT DETECTED Final   Enterobacterales DETECTED ([place]y [place]) NOT DETECTED Final    Comment: Enterobacterales represent Mikisha Roseland large order of gram negative bacteria, not Kelin Nixon single organism. CRITICAL RESULT CALLED TO, READ BACK BY AND VERIFIED WITH: PHARMD DREW WOFFORD [id number] 1232 BY J RAZZAK, MT  Enterobacter cloacae complex NOT DETECTED NOT DETECTED Final   Escherichia coli DETECTED (Kalisa Girtman) NOT DETECTED Final    Comment: CRITICAL RESULT CALLED TO, READ BACK BY AND VERIFIED WITH: PHARMD DREW WOFFORD [id number] 1232 BY J RAZZAK, MT    Klebsiella aerogenes NOT DETECTED NOT DETECTED Final   Klebsiella oxytoca NOT DETECTED NOT DETECTED Final   Klebsiella pneumoniae NOT DETECTED NOT DETECTED Final   Proteus  species NOT DETECTED NOT DETECTED Final   Salmonella species NOT DETECTED NOT DETECTED Final   Serratia marcescens NOT DETECTED NOT DETECTED Final   Haemophilus influenzae NOT DETECTED NOT DETECTED Final   Neisseria meningitidis NOT DETECTED NOT DETECTED Final   Pseudomonas aeruginosa NOT DETECTED NOT DETECTED Final   Stenotrophomonas maltophilia NOT DETECTED NOT DETECTED Final   Candida albicans NOT DETECTED NOT DETECTED Final   Candida auris NOT DETECTED NOT DETECTED Final   Candida glabrata NOT DETECTED NOT DETECTED Final   Candida krusei NOT DETECTED NOT DETECTED Final   Candida parapsilosis NOT DETECTED NOT DETECTED Final   Candida tropicalis NOT DETECTED NOT DETECTED Final   Cryptococcus neoformans/gattii NOT DETECTED NOT DETECTED Final   CTX-M ESBL NOT DETECTED NOT DETECTED Final   Carbapenem resistance IMP NOT DETECTED NOT DETECTED Final   Carbapenem resistance KPC NOT DETECTED NOT DETECTED Final   Carbapenem resistance NDM NOT DETECTED NOT DETECTED Final   Carbapenem resist OXA 48 LIKE NOT DETECTED NOT DETECTED Final   Vancomycin resistance NOT DETECTED NOT DETECTED Final   Carbapenem resistance VIM NOT DETECTED NOT DETECTED Final    Comment: Performed at [facility], [address] [place], [place] [address]  MRSA Next Gen by PCR, Nasal     Status: None   Collection Time: 12/19/22  1:02 AM   Specimen: Nasal Mucosa; Nasal Swab  Result Value Ref Range Status   MRSA by PCR Next Gen NOT DETECTED NOT DETECTED Final    Comment: (NOTE) The GeneXpert MRSA Assay (FDA approved for NASAL specimens only), is one component of Cleo Santucci comprehensive MRSA colonization surveillance program. It is not intended to diagnose MRSA infection nor to guide or monitor treatment for MRSA infections. Test performance is not FDA approved in patients less than 48 years old. Performed at [facility], [address], [place], [place] [address]   C Difficile Quick Screen w PCR  reflex     Status: None   Collection Time: 12/19/22  2:16 PM   Specimen: STOOL  Result Value Ref Range Status   C Diff antigen NEGATIVE NEGATIVE Final   C Diff toxin NEGATIVE NEGATIVE Final   C Diff interpretation No C. difficile detected.  Final    Comment: Performed at [facility], [address] [place], [place] [address]  Culture, blood (Routine X 2) w Reflex to ID Panel     Status: None (Preliminary result)   Collection Time: 12/20/22 12:41 PM   Specimen: BLOOD  Result Value Ref Range Status   Specimen Description   Final    BLOOD BLOOD LEFT HAND Performed at [facility], [address], [place], [place] [address]    Special Requests   Final    BOTTLES DRAWN AEROBIC ONLY Blood Culture results may not be optimal due to an inadequate volume of blood received in culture bottles Performed at [facility], [address] [place], [place] [address]    Culture   Final    NO GROWTH 2 DAYS Performed at [facility], [address], [place], [place] [address]    Report Status PENDING  Incomplete  Culture, blood (Routine X 2) w Reflex to ID Panel     Status: None (Preliminary result)   Collection Time: 12/20/22 12:47 PM   Specimen: BLOOD  Result Value Ref Range Status   Specimen Description   Final    BLOOD BLOOD RIGHT HAND Performed at [facility], [address], [place], [place] [address]    Special Requests   Final    BOTTLES DRAWN AEROBIC ONLY Blood Culture adequate volume Performed at [facility], [address] [place], [place] [address]    Culture   Final    NO GROWTH 2 DAYS Performed at [facility], [address] [place], [place] [address]    Report Status PENDING  Incomplete         Radiology Studies: VAS [place] LOWER EXTREMITY VENOUS (DVT)  Result Date: 12/21/2022  Lower Venous DVT Study Patient Name:  KOREE SCHOPF  Date of Exam:   12/21/2022 Medical Rec #:  [id number]        Accession #:    [id number] Date of Birth: 28-Aug-1933        Patient Gender: M Patient Age:   42 years Exam Location:  [facility] Procedure:      VAS [place] LOWER EXTREMITY VENOUS (DVT) Referring Phys: Nafeesa Dils POWELL JR --------------------------------------------------------------------------------  Indications: Pulmonary embolism. Other Indications: Edema, LT>RT. Anticoagulation: Heparin. Limitations: Patient positioning, bandages at the right calf. Comparison Study: No prior studies. Performing Technologist: Jean Rosenthal RDMS, RVT  Examination Guidelines: Krishon Adkison complete evaluation includes B-mode imaging, spectral Doppler, color Doppler, and power Doppler as needed of all accessible portions of each vessel. Bilateral testing is considered an integral part of Daisuke Bailey complete examination. Limited examinations for reoccurring indications may be performed as noted. The reflux portion of the exam is performed with the patient in reverse Trendelenburg.  +---------+---------------+---------+-----------+----------+-------------------+ RIGHT    CompressibilityPhasicitySpontaneityPropertiesThrombus Aging      +---------+---------------+---------+-----------+----------+-------------------+ CFV      Partial        Yes      Yes                  Acute               +---------+---------------+---------+-----------+----------+-------------------+ SFJ      Partial        Yes      Yes                  Acute               +---------+---------------+---------+-----------+----------+-------------------+ FV Prox  Partial        Yes      Yes                  Acute               +---------+---------------+---------+-----------+----------+-------------------+ FV Mid   Full                                                             +---------+---------------+---------+-----------+----------+-------------------+ FV DistalPartial        Yes      Yes  Age Indeterminate    +---------+---------------+---------+-----------+----------+-------------------+ PFV      Full                                                             +---------+---------------+---------+-----------+----------+-------------------+ POP      Full                                                             +---------+---------------+---------+-----------+----------+-------------------+ PTV      Full                                         Technically limited +---------+---------------+---------+-----------+----------+-------------------+ PERO     Full                                         Technically limited +---------+---------------+---------+-----------+----------+-------------------+   +---------+---------------+---------+-----------+----------+-----------------+ LEFT     CompressibilityPhasicitySpontaneityPropertiesThrombus Aging    +---------+---------------+---------+-----------+----------+-----------------+ CFV      None           No       No                   Acute             +---------+---------------+---------+-----------+----------+-----------------+ SFJ      None           No       No                   Acute             +---------+---------------+---------+-----------+----------+-----------------+ FV Prox  None           No       No                   Acute             +---------+---------------+---------+-----------+----------+-----------------+ FV Mid   None           No       No                   Acute             +---------+---------------+---------+-----------+----------+-----------------+ FV DistalPartial        Yes      Yes                  Age Indeterminate +---------+---------------+---------+-----------+----------+-----------------+ PFV      None           No       No                   Acute             +---------+---------------+---------+-----------+----------+-----------------+ POP      Partial        Yes       Yes  Age Indeterminate +---------+---------------+---------+-----------+----------+-----------------+ PTV      Full                                                           +---------+---------------+---------+-----------+----------+-----------------+ PERO     Partial        Yes      Yes                  Age Indeterminate +---------+---------------+---------+-----------+----------+-----------------+     Summary: RIGHT: - Findings consistent with acute deep vein thrombosis involving the right common femoral vein, SF junction, and right femoral vein. - Findings consistent with age indeterminate deep vein thrombosis involving the distal right femoral vein. - No cystic structure found in the popliteal fossa.  LEFT: - Findings consistent with acute deep vein thrombosis involving the left common femoral vein, SF junction, left femoral vein, and left proximal profunda vein. - Findings consistent with age indeterminate deep vein thrombosis involving the distal left femoral vein, left popliteal vein, and left peroneal veins. - No cystic structure found in the popliteal fossa. - Possible obstruction proximal to the inguinal ligament.  *See table(s) above for measurements and observations. Electronically signed by Lemar Livings MD on 12/21/2022 at 7:09:19 PM.    Final         Scheduled Meds:  Chlorhexidine Gluconate Cloth  6 each Topical Daily   collagenase   Topical Daily   sodium hypochlorite   Irrigation BID   Continuous Infusions:  sodium chloride     sodium chloride       LOS: 4 days    Time spent: over 30 min    Lacretia Nicks, MD [facility]   To contact the attending provider between 7A-7P or the covering provider during after hours 7P-7A, please log into the web site [email or web address] and access using universal [place] password for that web site. If you do not have the password, please call the hospital operator.  12/22/2022, 9:30 AM

## 2022-12-22 NOTE — TOC Progression Note (Signed)
Transition of Care [facility]) - Progression Note    Patient Details  Name: Edwin [name] MRN: [id number] Date of Birth: 04-Jun-1934  Transition of Care [facility] CM/SW Contact  Lavenia Atlas, RN Phone Number: 12/22/2022, 2:53 PM  Clinical Narrative:  Received [facility] consult for inpatient hospice. Per chart review patient's wife has chosen home hospice instead of residential/inpatient hospice. Watt Climes with [facility] is following for home hospice needs.   TOC following for needs.      Expected Discharge Plan: Home w Hospice Care Barriers to Discharge: Continued Medical Work up  Expected Discharge Plan and Services In-house Referral: Hospice / Palliative Care Discharge Planning Services: CM Consult Post Acute Care Choice: Hospice Living arrangements for the past 2 months: Single Family Home                 DME Arranged: N/A DME Agency: NA       HH Arranged: NA HH Agency:  ([facility])         Social Determinants of Health (SDOH) Interventions SDOH Screenings   Tobacco Use: Low Risk  (12/18/2022)    Readmission Risk Interventions    12/22/2022    2:43 PM  Readmission Risk Prevention Plan  Transportation Screening Complete  PCP or Specialist Appt within 3-5 Days Complete  HRI or Home Care Consult Complete  Social Work Consult for Recovery Care Planning/Counseling Complete  Palliative Care Screening Complete  Medication Review [employer]) Complete

## 2022-12-22 NOTE — Progress Notes (Signed)
  Daily Progress Note   Patient Name: Edwin [name]       Date: 12/22/2022 DOB: 1933-12-07  Age: 89 y.o. MRN#: [id number] Attending Physician: Zigmund Daniel., * Primary Care Physician: [name], DO Admit Date: 12/18/2022 Length of Stay: 4 days  Reviewed EMR today. Patient has been transitioned to full comfort focused care at this time. [facility] hospice liaison involved and discussed with wife plans for either inpatient hospice at [facility] vs home with hospice. Awaiting family's decision regarding this. Palliative medicine team continues to follow along with patient's journey. Please inform if acute PMT needs arise. Thank you.    Alvester Morin, DO Palliative Care Provider PMT # [phone]

## 2022-12-23 DIAGNOSIS — L89899 Pressure ulcer of other site, unspecified stage: Secondary | ICD-10-CM | POA: Diagnosis not present

## 2022-12-23 DIAGNOSIS — I96 Gangrene, not elsewhere classified: Secondary | ICD-10-CM

## 2022-12-23 DIAGNOSIS — I2699 Other pulmonary embolism without acute cor pulmonale: Secondary | ICD-10-CM | POA: Diagnosis not present

## 2022-12-23 DIAGNOSIS — J189 Pneumonia, unspecified organism: Secondary | ICD-10-CM | POA: Diagnosis not present

## 2022-12-23 DIAGNOSIS — L8994 Pressure ulcer of unspecified site, stage 4: Secondary | ICD-10-CM

## 2022-12-23 NOTE — Progress Notes (Signed)
[facility]  PROGRESS NOTE  Edwin [name] YQM:[id number] DOB: 16-Jan-1934 DOA: 12/18/2022 PCP: [name], DO   Brief HPI:   89 year old man with history of dementia, right hip fracture/hemiarthroplasty 05/2022, bedridden and nonverbal at baseline. Family is able to assist him into a wheelchair intermittently and he has home health assistance. He has reportedly had ongoing cough for a few weeks. There was concern that he was worsening and EMS was called.   He was admitted for sepsis.  Found to have polymicrobial bacteremia, osteomyelitis, decubitus ulcers, possible endocarditis.  After discussion with wife, now planning for comfort measures with hospice.     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 Prior hip arthroplasty with chronic periprosthetic fracture. Sacral decubitus ulcer overlying the sacrum and coccyx, loss of cortical definition of posterior cortex of distal most sacral segment suggestive of osteomyelitis  -Cultures grew E. coli, Enterococcus faecalis, Morganella morganii -ID consulted, concern for endocarditis -TTE showed normal mitral valve, aortic valve sclerosis, tricuspid valve normal in structure, pulmonic valve normal in structure -Initially plan to treat empirically for endocarditis/osteomyelitis, TEE was deferred -General surgery saw patient, [name]/p bedside debridement of the sacral decubitus and right lower extremity wound -Patient is now comfort care only  Acute pulmonary embolism -Initially on heparin GGT, stopped for comfort measures -Echocardiogram did not show RV strain -Lower extremity venous duplex obtained was positive for  bilateral lower extremity DVTs  Metabolic encephalopathy -Likely in setting of septic shock as above -Continue comfort measures  Goals of care -Patient'[name] wife discussed with Dr. Lowell Guitar, patient was made comfort care.  Plan was to go home with hospice versus become place  Medications     Chlorhexidine Gluconate Cloth  6 each Topical Daily   collagenase   Topical Daily   sodium hypochlorite   Irrigation BID     Data Reviewed:   CBG:  Recent Labs  Lab 12/21/22 1117 12/21/22 1143  GLUCAP 46* 83    SpO2: 93 %    Vitals:   12/22/22 0837 12/22/22 0839 12/22/22 1150 12/22/22 1540  BP:      Pulse: 91  98 96  Resp:  17 16 17   Temp:      TempSrc:      SpO2: 100%  98% 93%  Weight:      Height:          Data Reviewed:  Basic Metabolic Panel: Recent Labs  Lab 12/18/22 1807 12/19/22 0325 12/20/22 0337 12/21/22 0514  NA 137 139 140 139  K 3.1* 3.0* 3.2* 3.8  CL 104 108 110 109  CO2 24 22 23  20*  GLUCOSE 111* 98 83 55*  BUN 11 11 9 8   CREATININE 0.81 0.61 0.40* 0.60*  CALCIUM 9.5 8.9 8.2* 8.4*  MG  --  2.1 1.7 2.3  PHOS  --  1.6* 2.8 2.5    CBC: Recent Labs  Lab 12/18/22 1807 12/19/22 0325 12/20/22 0337 12/21/22 0514  WBC 9.1 24.0* 16.3* 16.6*  NEUTROABS 8.4*  --   --   --   HGB 9.4* 8.2* 8.1* 8.8*  HCT 29.2* 25.5* 24.7* 27.9*  MCV 74.9* 76.8* 72.0* 75.8*  PLT 153 129* 140* 171    LFT Recent Labs  Lab 12/18/22  1807 12/19/22 0325 12/21/22 0514  AST 43* 42* 28  ALT 28 26 24   ALKPHOS 73 63 59  BILITOT 1.0 0.8 0.6  PROT 7.2 5.2* 5.4*  ALBUMIN 2.2* 1.6* 1.7*     Antibiotics: Anti-infectives (From admission, onward)    Start     Dose/Rate Route Frequency Ordered Stop   12/20/22 0000  vancomycin (VANCOREADY) IVPB 1250 mg/250 mL  Status:  Discontinued        1,250 mg 166.7 mL/hr over 90 Minutes Intravenous Every 24 hours 12/19/22 0034 12/19/22 0920   12/19/22 2200  metroNIDAZOLE (FLAGYL) IVPB 500 mg  Status:  Discontinued        500  mg 100 mL/hr over 60 Minutes Intravenous 2 times daily 12/19/22 1715 12/21/22 1331   12/19/22 2000  ampicillin (OMNIPEN) 2 g in sodium chloride 0.9 % 100 mL IVPB  Status:  Discontinued        2 g 300 mL/hr over 20 Minutes Intravenous Every 4 hours 12/19/22 1715 12/21/22 1331   12/19/22 1815  cefTRIAXone (ROCEPHIN) 2 g in sodium chloride 0.9 % 100 mL IVPB  Status:  Discontinued        2 g 200 mL/hr over 30 Minutes Intravenous 2 times daily 12/19/22 1715 12/21/22 1331   12/19/22 1030  piperacillin-tazobactam (ZOSYN) IVPB 3.375 g  Status:  Discontinued        3.375 g 12.5 mL/hr over 240 Minutes Intravenous Every 8 hours 12/19/22 1015 12/19/22 1715   12/19/22 0800  ceFEPIme (MAXIPIME) 2 g in sodium chloride 0.9 % 100 mL IVPB  Status:  Discontinued        2 g 200 mL/hr over 30 Minutes Intravenous Every 8 hours 12/19/22 0034 12/19/22 0920   12/18/22 2230  ceFEPIme (MAXIPIME) 2 g in sodium chloride 0.9 % 100 mL IVPB        2 g 200 mL/hr over 30 Minutes Intravenous  Once 12/18/22 2222 12/18/22 2321   12/18/22 2230  metroNIDAZOLE (FLAGYL) IVPB 500 mg        500 mg 100 mL/hr over 60 Minutes Intravenous  Once 12/18/22 2222 12/19/22 0010   12/18/22 2230  vancomycin (VANCOCIN) IVPB 1000 mg/200 mL premix        1,000 mg 200 mL/hr over 60 Minutes Intravenous  Once 12/18/22 2222 12/19/22 0122        DVT prophylaxis: SCDs  Code Status: DNR  Family Communication:    CONSULTS    Subjective   Continues to be barely responsive.   Objective    Physical Examination:   General:.  Appears lethargic Cardiovascular: S1-S2, regular Respiratory: Lungs clear to auscultation bilaterally Abdomen: Soft, nontender, no organomegaly Extremities: No edema in the lower extremities Neurologic: Not responding to verbal or tactile stimuli   Status is: Inpatient:      Pressure Injury 12/19/22 Coccyx Bilateral Unstageable - Full thickness tissue loss in which the base of the injury is covered by  slough (yellow, tan, gray, green or brown) and/or eschar (tan, brown or black) in the wound bed. 15cm x 16cm w/ tunneling (Active)  12/19/22 0100  Location: Coccyx  Location Orientation: Bilateral  Staging: Unstageable - Full thickness tissue loss in which the base of the injury is covered by slough (yellow, tan, gray, green or brown) and/or eschar (tan, brown or black) in the wound bed.  Wound Description (Comments): 15cm x 16cm w/ tunneling  Present on Admission: Yes     Pressure Injury 12/19/22 Shoulder Left Unstageable - Full thickness  tissue loss in which the base of the injury is covered by slough (yellow, tan, gray, green or brown) and/or eschar (tan, brown or black) in the wound bed. 5.5cm x 2 cm; eschar (Active)  12/19/22 0100  Location: Shoulder  Location Orientation: Left  Staging: Unstageable - Full thickness tissue loss in which the base of the injury is covered by slough (yellow, tan, gray, green or brown) and/or eschar (tan, brown or black) in the wound bed.  Wound Description (Comments): 5.5cm x 2 cm; eschar  Present on Admission:      Pressure Injury 12/19/22 Heel Left Unstageable - Full thickness tissue loss in which the base of the injury is covered by slough (yellow, tan, gray, green or brown) and/or eschar (tan, brown or black) in the wound bed. 5.5 cm x 4 cm; eschar (Active)  12/19/22 0100  Location: Heel  Location Orientation: Left  Staging: Unstageable - Full thickness tissue loss in which the base of the injury is covered by slough (yellow, tan, gray, green or brown) and/or eschar (tan, brown or black) in the wound bed.  Wound Description (Comments): 5.5 cm x 4 cm; eschar  Present on Admission: Yes     Pressure Injury 12/19/22 Back Lateral;Left;Upper Unstageable - Full thickness tissue loss in which the base of the injury is covered by slough (yellow, tan, gray, green or brown) and/or eschar (tan, brown or black) in the wound bed. 5.5 cm x 2 cm; eschar (Active)   12/19/22 0100  Location: Back  Location Orientation: Lateral;Left;Upper  Staging: Unstageable - Full thickness tissue loss in which the base of the injury is covered by slough (yellow, tan, gray, green or brown) and/or eschar (tan, brown or black) in the wound bed.  Wound Description (Comments): 5.5 cm x 2 cm; eschar  Present on Admission: Yes     Pressure Injury 12/19/22 Tibial Posterior;Right Unstageable - Full thickness tissue loss in which the base of the injury is covered by slough (yellow, tan, gray, green or brown) and/or eschar (tan, brown or black) in the wound bed. 11 cm x 5.5 cm w/ tunn (Active)  12/19/22 0100  Location: Tibial  Location Orientation: Posterior;Right  Staging: Unstageable - Full thickness tissue loss in which the base of the injury is covered by slough (yellow, tan, gray, green or brown) and/or eschar (tan, brown or black) in the wound bed.  Wound Description (Comments): 11 cm x 5.5 cm w/ tunneling  Present on Admission: Yes     Pressure Injury 12/19/22 Toe (Comment  which one) Left Unstageable - Full thickness tissue loss in which the base of the injury is covered by slough (yellow, tan, gray, green or brown) and/or eschar (tan, brown or black) in the wound bed. 2 cm x 2 cm; es (Active)  12/19/22 0100  Location: Toe (Comment  which one)  Location Orientation: Left  Staging: Unstageable - Full thickness tissue loss in which the base of the injury is covered by slough (yellow, tan, gray, green or brown) and/or eschar (tan, brown or black) in the wound bed.  Wound Description (Comments): 2 cm x 2 cm; eschar  Present on Admission:      Pressure Injury 12/21/22 Sacrum Mid Unstageable - Full thickness tissue loss in which the base of the injury is covered by slough (yellow, tan, gray, green or brown) and/or eschar (tan, brown or black) in the wound bed. *PT ONLY* unstageable, pressure in (Active)  12/21/22   Location: Sacrum  Location Orientation: Mid  Staging:  Unstageable - Full thickness tissue loss in which the base of the injury is covered by slough (yellow, tan, gray, green or brown) and/or eschar (tan, brown or black) in the wound bed.  Wound Description (Comments): *PT ONLY* unstageable, pressure injury to sacrum, bone palpapable bse of wound  Present on Admission: Yes        Edwin [name] Hanad Leino   [facility] If 7PM-7AM, please contact night-coverage at [email or web address], Office  [phone]   12/23/2022, 8:41 AM  LOS: 5 days

## 2022-12-23 NOTE — Consult Note (Signed)
Patient has been transitioned to comfort care and is awaiting DC with Hospice. All wound care simplified to daily for maintenance and comfort.  WOC team will not follow at this time.  Please re-consult if further needs arise.   Thank you,    Priscella Mann MSN, RN-BC, [employer] [phone]

## 2022-12-23 NOTE — Progress Notes (Signed)
Civil engineer, contracting [facility]) Hospital Liaison Note   MSW arrived to bedside at 2 pm. Spouse not present and contacted MSW shortly after reporting that she was under the impression meeting would be conducted at patients home. MSW provided education of MSW role and that [facility] would visit once patient is in the home, and spouse voiced understanding.   MSW offered to meet spouse at bedside and declined reporting that she will hold until AV RN visits patient upon dc. Spouse continues to decline any DME needs reporting that she has necessary equipment. Spouse also declines BP transfer as she prefers patient in the home. Patient reports that she wants to speak to MD regarding DC as she does not feel prepared for DC.    [facility] information and contact numbers given to family & above information shared with MD.   Please call with any questions/concerns.    Thank you for the opportunity to participate in this patient's care.   Eugenie Birks, MSW [facility] Liaison  [phone]

## 2022-12-23 NOTE — Progress Notes (Signed)
Patient has orders for comfort care. Patient has numerous unstageable wounds. Will change if dressings become soiled with secretions. Sacral wound has been changed per orders due to purulent drainage saturating dressing.  Pain meds provided during wound care of sacrum.  Patient currently resting comfortably, no family at beside at this moment.  Will continue to focus on comfort measures for patient.

## 2022-12-24 DIAGNOSIS — Z515 Encounter for palliative care: Secondary | ICD-10-CM

## 2022-12-24 DIAGNOSIS — L89899 Pressure ulcer of other site, unspecified stage: Secondary | ICD-10-CM | POA: Diagnosis not present

## 2022-12-24 DIAGNOSIS — L89159 Pressure ulcer of sacral region, unspecified stage: Secondary | ICD-10-CM

## 2022-12-24 DIAGNOSIS — F419 Anxiety disorder, unspecified: Secondary | ICD-10-CM

## 2022-12-24 DIAGNOSIS — F0390 Unspecified dementia without behavioral disturbance: Secondary | ICD-10-CM

## 2022-12-24 DIAGNOSIS — R6521 Severe sepsis with septic shock: Secondary | ICD-10-CM | POA: Diagnosis not present

## 2022-12-24 DIAGNOSIS — J189 Pneumonia, unspecified organism: Secondary | ICD-10-CM | POA: Diagnosis not present

## 2022-12-24 DIAGNOSIS — R4589 Other symptoms and signs involving emotional state: Secondary | ICD-10-CM

## 2022-12-24 DIAGNOSIS — I2699 Other pulmonary embolism without acute cor pulmonale: Secondary | ICD-10-CM | POA: Diagnosis not present

## 2022-12-24 DIAGNOSIS — Z79899 Other long term (current) drug therapy: Secondary | ICD-10-CM

## 2022-12-24 DIAGNOSIS — A419 Sepsis, unspecified organism: Secondary | ICD-10-CM | POA: Diagnosis not present

## 2022-12-24 DIAGNOSIS — Z66 Do not resuscitate: Secondary | ICD-10-CM

## 2022-12-24 DIAGNOSIS — R52 Pain, unspecified: Secondary | ICD-10-CM

## 2022-12-24 DIAGNOSIS — Z7189 Other specified counseling: Secondary | ICD-10-CM

## 2022-12-24 MED ORDER — HALOPERIDOL LACTATE 5 MG/ML IJ SOLN: 1.0000 mg | INTRAMUSCULAR | Status: DC | PRN

## 2022-12-24 MED ORDER — DIAZEPAM 5 MG/ML IJ SOLN
2.5000 mg | INTRAMUSCULAR | Status: DC | PRN
  Filled 2022-12-24: qty 2

## 2022-12-24 MED ORDER — MORPHINE SULFATE (PF) 2 MG/ML IV SOLN
2.0000 mg | INTRAVENOUS | Status: DC | PRN
  Administered 2022-12-24 – 2022-12-25 (×7): 2 mg via INTRAVENOUS
  Filled 2022-12-24 (×7): qty 1

## 2022-12-24 MED ORDER — BISACODYL 10 MG RE SUPP: 10.0000 mg | Freq: Every day | RECTAL | Status: DC | PRN

## 2022-12-24 NOTE — Plan of Care (Signed)
Pt alert and oriented to name and date of birth with confusion to situation and time.  Pt with multiple pressure injuries, cleaned and dressed per ICU nurse before transport.  Pt Q2 turns, tolerates poorly as it increases pain.  Pt with rectal tube and external urinary bag for output collection and to aid in wound healing.  PT with feet in prevalon boots bilateral to assist with wound healing, padded dressings on all wounds. Pt resting quietly most of shift, pain medication given as needed.

## 2022-12-24 NOTE — Progress Notes (Addendum)
[facility]  PROGRESS NOTE  Edwin [name] ZOX:[id number] DOB: [birth year] DOA: 01/01/2023 PCP: [name], DO   Brief HPI:   89 year old man with history of dementia, right hip fracture/hemiarthroplasty 05/2022, bedridden and nonverbal at baseline. Family is able to assist him into a wheelchair intermittently and he has home health assistance. He has reportedly had ongoing cough for a few weeks. There was concern that he was worsening and EMS was called.   He was admitted for sepsis.  Found to have polymicrobial bacteremia, osteomyelitis, decubitus ulcers, possible endocarditis.  After discussion with wife, now planning for comfort measures with hospice.     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 Prior hip arthroplasty with chronic periprosthetic fracture. Sacral decubitus ulcer overlying the sacrum and coccyx, loss of cortical definition of posterior cortex of distal most sacral segment suggestive of osteomyelitis  -Cultures grew E. coli, Enterococcus faecalis, Morganella morganii -ID consulted, concern for endocarditis -TTE showed normal mitral valve, aortic valve sclerosis, tricuspid valve normal in structure, pulmonic valve normal in structure -Initially plan to treat empirically for endocarditis/osteomyelitis, TEE was deferred -General surgery saw patient, s/p bedside debridement of the sacral decubitus and right lower extremity wound -Patient is now comfort care only  Acute pulmonary embolism -Initially on heparin GGT, stopped for comfort measures -Echocardiogram did not show RV strain -Lower extremity venous duplex obtained was positive for  bilateral lower extremity DVTs  Metabolic encephalopathy -Likely in setting of septic shock as above -Continue comfort measures  Goals of care -Patient's wife discussed with Dr. Lowell Guitar, patient was made comfort care.  Plan was to go home with hospice, however patient's wife expressed desire that patient can stay in the hospital for end-of-life care.  Palliative care following Anticipate hospital death  Medications        Data Reviewed:   CBG:  Recent Labs  Lab 12/21/22 1117 12/21/22 1143  GLUCAP 46* 83    SpO2: 96 %    Vitals:   12/23/22 1753 12/23/22 1800 12/24/22 0514 12/24/22 0527  BP:    (!) 121/99  Pulse: (!) 113 99  98  Resp: (!) 21 16  18   Temp:    97.9 F (36.6 C)  TempSrc:    Oral  SpO2: 91% 100%  96%  Weight:   64.3 kg   Height:          Data Reviewed:  Basic Metabolic Panel: Recent Labs  Lab 12/10/2022 1807 12/19/22 0325 12/20/22 0337 12/21/22 0514  NA 137 139 140 139  K 3.1* 3.0* 3.2* 3.8  CL 104 108 110 109  CO2 24 22 23  20*  GLUCOSE 111* 98 83 55*  BUN 11 11 9 8   CREATININE 0.81 0.61 0.40* 0.60*  CALCIUM 9.5 8.9 8.2* 8.4*  MG  --  2.1 1.7 2.3  PHOS  --  1.6* 2.8 2.5    CBC: Recent Labs  Lab 12/10/2022 1807 12/19/22 0325 12/20/22 0337 12/21/22 0514  WBC 9.1 24.0* 16.3* 16.6*  NEUTROABS 8.4*  --   --   --   HGB 9.4* 8.2* 8.1* 8.8*  HCT 29.2* 25.5* 24.7* 27.9*  MCV 74.9* 76.8* 72.0* 75.8*  PLT 153 129* 140* 171    LFT Recent Labs  Lab 01/02/2023 1807 12/19/22 0325 12/21/22 0514  AST 43* 42* 28  ALT 28 26 24   ALKPHOS 73 63 59  BILITOT 1.0 0.8 0.6  PROT 7.2 5.2* 5.4*  ALBUMIN 2.2* 1.6* 1.7*     Antibiotics: Anti-infectives (From admission, onward)    Start     Dose/Rate Route Frequency Ordered Stop   12/20/22 0000  vancomycin (VANCOREADY) IVPB 1250 mg/250 mL  Status:  Discontinued        1,250 mg 166.7 mL/hr over 90 Minutes Intravenous Every 24 hours 12/19/22 0034 12/19/22 0920   12/19/22 2200  metroNIDAZOLE  (FLAGYL) IVPB 500 mg  Status:  Discontinued        500 mg 100 mL/hr over 60 Minutes Intravenous 2 times daily 12/19/22 1715 12/21/22 1331   12/19/22 2000  ampicillin (OMNIPEN) 2 g in sodium chloride 0.9 % 100 mL IVPB  Status:  Discontinued        2 g 300 mL/hr over 20 Minutes Intravenous Every 4 hours 12/19/22 1715 12/21/22 1331   12/19/22 1815  cefTRIAXone (ROCEPHIN) 2 g in sodium chloride 0.9 % 100 mL IVPB  Status:  Discontinued        2 g 200 mL/hr over 30 Minutes Intravenous 2 times daily 12/19/22 1715 12/21/22 1331   12/19/22 1030  piperacillin-tazobactam (ZOSYN) IVPB 3.375 g  Status:  Discontinued        3.375 g 12.5 mL/hr over 240 Minutes Intravenous Every 8 hours 12/19/22 1015 12/19/22 1715   12/19/22 0800  ceFEPIme (MAXIPIME) 2 g in sodium chloride 0.9 % 100 mL IVPB  Status:  Discontinued        2 g 200 mL/hr over 30 Minutes Intravenous Every 8 hours 12/19/22 0034 12/19/22 0920   12/28/2022 2230  ceFEPIme (MAXIPIME) 2 g in sodium chloride 0.9 % 100 mL IVPB        2 g 200 mL/hr over 30 Minutes Intravenous  Once 12/28/2022 2222 12/13/2022 2321   12/26/2022 2230  metroNIDAZOLE (FLAGYL) IVPB 500 mg        500 mg 100 mL/hr over 60 Minutes Intravenous  Once 12/29/2022 2222 12/19/22 0010   12/25/2022 2230  vancomycin (VANCOCIN) IVPB 1000 mg/200 mL premix        1,000 mg 200 mL/hr over 60 Minutes Intravenous  Once 12/13/2022 2222 12/19/22 0122        DVT prophylaxis: SCDs  Code Status: DNR  Family Communication:    CONSULTS    Subjective    Palliative care discussed with patient's wife, patient to stay in the hospital for end-of-life care.  No new changes  Objective    Physical Examination:  Barely opens eyes to sternal rub S1-S2, regular Lungs clear to auscultation bilaterally   Status is: Inpatient:      Pressure Injury 12/19/22 Coccyx Bilateral Unstageable - Full thickness tissue loss in which the base of the injury is covered by slough (yellow, tan, gray, green or  brown) and/or eschar (tan, brown or black) in the wound bed. 15cm x 16cm w/ tunneling (Active)  12/19/22 0100  Location: Coccyx  Location Orientation: Bilateral  Staging: Unstageable - Full thickness tissue loss in which the base of the injury is covered by slough (yellow, tan, gray, green or brown) and/or eschar (tan, brown or black) in the wound bed.  Wound Description (Comments): 15cm x 16cm w/ tunneling  Present on Admission: Yes     Pressure Injury 12/19/22 Shoulder Left Unstageable - Full thickness tissue loss in which the  base of the injury is covered by slough (yellow, tan, gray, green or brown) and/or eschar (tan, brown or black) in the wound bed. 5.5cm x 2 cm; eschar (Active)  12/19/22 0100  Location: Shoulder  Location Orientation: Left  Staging: Unstageable - Full thickness tissue loss in which the base of the injury is covered by slough (yellow, tan, gray, green or brown) and/or eschar (tan, brown or black) in the wound bed.  Wound Description (Comments): 5.5cm x 2 cm; eschar  Present on Admission:      Pressure Injury 12/19/22 Heel Left Unstageable - Full thickness tissue loss in which the base of the injury is covered by slough (yellow, tan, gray, green or brown) and/or eschar (tan, brown or black) in the wound bed. 5.5 cm x 4 cm; eschar (Active)  12/19/22 0100  Location: Heel  Location Orientation: Left  Staging: Unstageable - Full thickness tissue loss in which the base of the injury is covered by slough (yellow, tan, gray, green or brown) and/or eschar (tan, brown or black) in the wound bed.  Wound Description (Comments): 5.5 cm x 4 cm; eschar  Present on Admission: Yes     Pressure Injury 12/19/22 Back Lateral;Left;Upper Unstageable - Full thickness tissue loss in which the base of the injury is covered by slough (yellow, tan, gray, green or brown) and/or eschar (tan, brown or black) in the wound bed. 5.5 cm x 2 cm; eschar (Active)  12/19/22 0100  Location: Back   Location Orientation: Lateral;Left;Upper  Staging: Unstageable - Full thickness tissue loss in which the base of the injury is covered by slough (yellow, tan, gray, green or brown) and/or eschar (tan, brown or black) in the wound bed.  Wound Description (Comments): 5.5 cm x 2 cm; eschar  Present on Admission: Yes     Pressure Injury 12/19/22 Tibial Posterior;Right Unstageable - Full thickness tissue loss in which the base of the injury is covered by slough (yellow, tan, gray, green or brown) and/or eschar (tan, brown or black) in the wound bed. 11 cm x 5.5 cm w/ tunn (Active)  12/19/22 0100  Location: Tibial  Location Orientation: Posterior;Right  Staging: Unstageable - Full thickness tissue loss in which the base of the injury is covered by slough (yellow, tan, gray, green or brown) and/or eschar (tan, brown or black) in the wound bed.  Wound Description (Comments): 11 cm x 5.5 cm w/ tunneling  Present on Admission: Yes     Pressure Injury 12/19/22 Toe (Comment  which one) Left Unstageable - Full thickness tissue loss in which the base of the injury is covered by slough (yellow, tan, gray, green or brown) and/or eschar (tan, brown or black) in the wound bed. 2 cm x 2 cm; es (Active)  12/19/22 0100  Location: Toe (Comment  which one)  Location Orientation: Left  Staging: Unstageable - Full thickness tissue loss in which the base of the injury is covered by slough (yellow, tan, gray, green or brown) and/or eschar (tan, brown or black) in the wound bed.  Wound Description (Comments): 2 cm x 2 cm; eschar  Present on Admission:      Pressure Injury 12/21/22 Sacrum Mid Unstageable - Full thickness tissue loss in which the base of the injury is covered by slough (yellow, tan, gray, green or brown) and/or eschar (tan, brown or black) in the wound bed. *PT ONLY* unstageable, pressure in (Active)  12/21/22   Location: Sacrum  Location Orientation: Mid  Staging: Unstageable - Full thickness  tissue  loss in which the base of the injury is covered by slough (yellow, tan, gray, green or brown) and/or eschar (tan, brown or black) in the wound bed.  Wound Description (Comments): *PT ONLY* unstageable, pressure injury to sacrum, bone palpapable bse of wound  Present on Admission: Yes        Edwin [name] S Edwin [name]   [facility] If 7PM-7AM, please contact night-coverage at [email or web address], Office  [phone]   12/24/2022, 1:58 PM  LOS: 6 days

## 2022-12-24 NOTE — Progress Notes (Signed)
Daily Progress Note   Patient Name: Edwin [name]       Date: 12/24/2022 DOB: 1934-01-20  Age: 89 y.o. MRN#: [id number] Attending Physician: Meredeth Ide, MD Primary Care Physician: [name], DO Admit Date: 12/13/2022 Length of Stay: 6 days  Reason for Consultation/Follow-up: Establishing goals of care and symptom management  Subjective:   CC: Patient unresponsive to voice or touch when seen this morning. No family at bedside.  Able to discuss care with wife over phone.  Following up regarding complex medical decision making and symptom management.  Subjective:  Reviewed EMR prior to presenting to bedside.  Patient has been receiving as needed IV fentanyl and IV morphine for symptom management including comfort focused care.  No oral intake as per review of EMR. [facility] liaison has been discussing hospice with wife.  Patient's wife reluctant to transfer to [facility] and instead wanting to bring patient home herself.  Discussed care with IDT, including [facility] liaison, today prior to calling wife.  When presenting to bedside, patient laying unresponsive to voice or touch in the bed.  Patient's hands cool to touch.  No family present at bedside.  Able to call patient's wife after visit.  Introduced myself as a member of the palliative medicine team.  Able to discuss patient's current condition.  Expressed concern over wife managing patient's care alone at home and even transporting him home when he is requiring extensive care needs here in the hospital.  With this in mind, able to discuss realistic pathway for medical care moving forward.  Wife mains reluctant to place patient in any other facility, including [facility] due to prior experiences at other facilities.  Acknowledged this and provided emotional support via active listening.  Did spend time explaining the differences between inpatient hospice at [facility] and long-term care facilities.  Spite discussion, wife still noting  would rather take patient home then sent him to [facility].  Expressed concern that with patient's wife being only caregiver to him prior to being on comfort at the end of life, recommend patient remain in the hospital at this time.  Wife agreeing with this plan.  Had discussed with hospitalist and bedside RN as well.  Noted priority would be patient's comfort at the end of life and to maintain dignity and death.  Wife acknowledging and agreeing with this.  Answered all questions as able.  Wife voiced appreciation for phone call today.  Noted palliative medicine team will continue to follow along with patient's journey in the hospital as would anticipate in-hospital death.  Again updated IDT after discussion with wife.  Objective:   Vital Signs:  BP (!) 121/99 (BP Location: Right Arm)   Pulse 98   Temp 97.9 F (36.6 C) (Oral)   Resp 18   Ht 5\' 11"  (1.803 m)   Wt 64.3 kg   SpO2 96%   BMI 19.77 kg/m   Physical Exam: General: cachetic, frail, laying in bed, unresponsive  Eyes: No drainage noted HENT: Dry mucous membranes Cardiovascular: RRR Respiratory: slightly increased work of breathing noted, not in respiratory distress Extremities: Multiple wounds present with bandages in place Neuro: Unresponsive  I personally reviewed recent imaging.   Assessment & Plan:   Assessment: Patient is an 89 year old male with a past medical history of dementia, right hip fracture/ME arthroplasty 1223, bed ridden, and nonverbal at baseline who was admitted on 01/02/2023 for management of ongoing cough.  During hospitalization patient initially managed for septic shock due to  polymicrobial bacteremia, acute pulmonary embolism, and metabolic encephalopathy.  During hospitalization after conversations with patient's family, patient transition to comfort focused care on 12/21/2022.  Palliative medicine team consulted to assist with complex medical decision making and symptom  management.  Recommendations/Plan: # Complex medical decision making/goals of care:  - Patient unable to participate in complex medical decision-making due to medical status.  -Patient was transition to full comfort focused care on 12/21/2022 after hospitalist discussed care with patient's wife.  Continuing comfort focused care only at this time to maintain symptom management and dignity at the end of life.   -Had involved [facility] hospice though based on patient's current condition wife is unable to care for him at home by herself.  Wife refusing transfer to another facility such as Psychologist, sport and exercise.  Continuing comfort focused care in the hospital at this time.  Anticipate in-hospital death.  -  Code Status: DNR  # Symptom management  -Pain/Dyspnea, acute in the setting of end-of-life care           -Increase IV morphine to 2-4mg  q1 hr prn. Continue to adjust based on patient's symptom burden. If needing frequent dosing, may need to consider continuous infusion.  -Discontinue IV fentanyl                 -Anxiety/agitation, in the setting of end-of-life care                               -Increase IV diazepam to 2.5-5mg  q4hrs prn. Continue to adjust based on patient's symptom burden.                   -Secretions, in the setting of end-of-life care                               - IV glycopyrrolate 0.2 mg every 4 hours as needed.  # Psychosocial Support:  -Wife  # Discharge Planning: Anticipated Hospital Death  Discussed with: [facility] liaison, patient's wife, RN, hosptialist   Thank you for allowing the palliative care team to participate in the care [facility].  Alvester Morin, DO Palliative Care Provider PMT # [phone]  If patient remains symptomatic despite maximum doses, please call PMT at [phone] between 0700 and 1900. Outside of these hours, please call attending, as PMT does not have night coverage.  *Please note that this is a verbal dictation therefore any spelling or  grammatical errors are due to the "Dragon Medical One" system interpretation.

## 2022-12-25 DIAGNOSIS — I2699 Other pulmonary embolism without acute cor pulmonale: Secondary | ICD-10-CM | POA: Diagnosis not present

## 2022-12-25 DIAGNOSIS — Z515 Encounter for palliative care: Secondary | ICD-10-CM | POA: Diagnosis not present

## 2022-12-25 DIAGNOSIS — L89899 Pressure ulcer of other site, unspecified stage: Secondary | ICD-10-CM | POA: Diagnosis not present

## 2022-12-25 DIAGNOSIS — J189 Pneumonia, unspecified organism: Secondary | ICD-10-CM | POA: Diagnosis not present

## 2022-12-25 DIAGNOSIS — F419 Anxiety disorder, unspecified: Secondary | ICD-10-CM

## 2022-12-25 DIAGNOSIS — A419 Sepsis, unspecified organism: Secondary | ICD-10-CM | POA: Diagnosis not present

## 2022-12-25 LAB — CULTURE, BLOOD (ROUTINE X 2)
Culture: NO GROWTH
Culture: NO GROWTH
Special Requests: ADEQUATE

## 2022-12-25 MED ORDER — MORPHINE BOLUS VIA INFUSION: 2.0000 mg | INTRAVENOUS | Status: DC | PRN

## 2022-12-25 MED ORDER — MORPHINE SULFATE (PF) 2 MG/ML IV SOLN: 2.0000 mg | INTRAVENOUS | Status: DC | PRN

## 2022-12-25 MED ORDER — MORPHINE 100MG IN NS 100ML (1MG/ML) PREMIX INFUSION
2.0000 mg/h | INTRAVENOUS | Status: DC
  Administered 2022-12-25: 2 mg/h via INTRAVENOUS
  Administered 2022-12-26 – 2022-12-29 (×3): 3 mg/h via INTRAVENOUS
  Administered 2022-12-31 – 2023-01-03 (×4): 4 mg/h via INTRAVENOUS
  Filled 2022-12-25 (×12): qty 100

## 2022-12-25 NOTE — TOC Progression Note (Signed)
Transition of Care [facility]) - Progression Note    Patient Details  Name: Edwin [name] MRN: [id number] Date of Birth: 1933/12/24  Transition of Care [facility] CM/SW Contact  Beckie Busing, RN Phone Number:[phone]  12/25/2022, 3:48 PM  Clinical Narrative:    Patient wife unable to care for him at home and does not want residential hospice. Per MD priority is comfort and expected hospital death. TOC will sign off   Expected Discharge Plan: Home w Hospice Care Barriers to Discharge: Continued Medical Work up  Expected Discharge Plan and Services In-house Referral: Hospice / Palliative Care Discharge Planning Services: CM Consult Post Acute Care Choice: Hospice Living arrangements for the past 2 months: Single Family Home                 DME Arranged: N/A DME Agency: NA       HH Arranged: NA HH Agency:  ([facility])         Social Determinants of Health (SDOH) Interventions SDOH Screenings   Tobacco Use: Low Risk  (12/10/2022)    Readmission Risk Interventions    12/25/2022    3:47 PM 12/22/2022    2:43 PM  Readmission Risk Prevention Plan  Transportation Screening Complete Complete  PCP or Specialist Appt within 5-7 Days Complete   PCP or Specialist Appt within 3-5 Days  Complete  Home Care Screening Complete   Medication Review (RN CM) Complete   HRI or Home Care Consult  Complete  Social Work Consult for Recovery Care Planning/Counseling  Complete  Palliative Care Screening  Complete  Medication Review [employer])  Complete

## 2022-12-25 NOTE — Progress Notes (Signed)
Chaplain provided supportive presence at bedside.  Edwin [name] was not alert or awake and family was not present.  Chaplain sat beside him for several minutes.

## 2022-12-25 NOTE — Progress Notes (Signed)
[facility] Liaison Note  Hospital Liaison Team continues to follow peripherally for hospice support as needed.  Please reach out for any questions or concerns.  Thank you, Haynes Bast, BSN, [facility]  [phone]

## 2022-12-25 NOTE — Plan of Care (Signed)
Pt nonverbal this shift, moaning frequently.  Pain medication given for pain and ease of breathing.  Q2 turns completed, pain with moving, groaning often.  Pt with signs of difficulting breathing prn medication given, patient resting with eyes closed.  Pt with respirations of 7-8 a minute, without urine output this shift, increased moaning throughout shift, pain medication given as needed.  Wound care provided for all wounds.  Feet placed in prevalon boots.  Pt resting with eyes closed

## 2022-12-25 NOTE — Progress Notes (Addendum)
Daily Progress Note   Patient Name: Edwin [name]       Date: 12/25/2022 DOB: 09-24-1933  Age: 89 y.o. MRN#: [id number] Attending Physician: Meredeth Ide, MD Primary Care Physician: [name], DO Admit Date: 01/05/2023 Length of Stay: 7 days  Reason for Consultation/Follow-up: Establishing goals of care and symptom management  Subjective:   CC: Patient unresponsive to voice or touch when seen this morning.  Patient did appear to have increased work of breathing and signs of nonverbal pain.  No family at bedside.  Following up regarding complex medical decision making and symptom management.  Subjective:  Reviewed EMR prior to presenting to bedside.  Time of EMR review patient had received glycopyrrolate IV x 1 dose as well as morphine IV 2 mg x 6 doses.  Presented to bedside to check on patient. Patient laying unresponsive in bed. Patient showing nonverbal signs of pain such as facial grimacing and increased work of breathing. No family present at bedside during visit.   Discussed care with RN for updates. Patient has had worsening pain overnight with moaning. Discuss starting continuous morphine infusion today since wife's goal for patient is comfort and not to suffer at the end of life.   Presented to bedside later in afternoon to follow up on patient's symptoms and meet with family. Patient appeared more comfortable than when seen this AM. Patient had periods of apnea this morning though noted time between pauses continues to increase. Met with patient's wife at bedside. She noted more family would be in later to visit. Discussed use of continuous medication for comfort and again knows times is short. She is thankful the end of life care patient is receiving here.  Spent time providing emotional support via active listening. All questions answered at that time. Thanked wife for allowing me to visit with her and the patient. Noted PMT would continue to follow along.  Objective:    Vital Signs:  BP 104/66 (BP Location: Right Arm)   Pulse 84   Temp 97.7 F (36.5 C) (Axillary)   Resp 16   Ht 5\' 11"  (1.803 m)   Wt 64.3 kg   SpO2 97%   BMI 19.77 kg/m   Physical Exam: General: cachetic, frail, laying in bed, unresponsive, facial grimacing noted Eyes: No drainage noted HENT: Dry mucous membranes Cardiovascular: RRR Respiratory: slightly increased work of breathing noted, not in respiratory distress Extremities: Multiple wounds present with bandages in place Neuro: Unresponsive  I personally reviewed recent imaging.   Assessment & Plan:   Assessment: Patient is an 89 year old male with a past medical history of dementia, right hip fracture/ME arthroplasty 1223, bed ridden, and nonverbal at baseline who was admitted on 12/15/2022 for management of ongoing cough.  During hospitalization patient initially managed for septic shock due to polymicrobial bacteremia, acute pulmonary embolism, and metabolic encephalopathy.  During hospitalization after conversations with patient's family, patient transition to comfort focused care on 12/21/2022.  Palliative medicine team consulted to assist with complex medical decision making and symptom management.  Recommendations/Plan: # Complex medical decision making/goals of care:  - Patient unable to participate in complex medical decision-making due to medical status.  -Patient was transition to full comfort focused care on 12/21/2022 after hospitalist discussed care with patient's wife.  Continuing comfort focused care only at this time to maintain symptom management and dignity at the end of life. Discussed care with patient's wife at bedside on 7/19.    -Had previously involved [facility] hospice though based on  patient's current condition wife would be unable to care for him at home by herself.  Wife refusing transfer to another facility such as Psychologist, sport and exercise.  Continuing comfort focused care in the hospital at this time.  Anticipate  in-hospital death.  -  Code Status: DNR  # Symptom management  -Pain/Dyspnea, acute in the setting of end-of-life care -Start continuous morphine infusion at 2-5mg /hr titration with RN bolus from infusion at 2mg  q30 mins prn for breakthrough pain/dyspnea.            -Discontinue IV morphine to 2-4mg  q1 hr once infusion started.                  -Anxiety/agitation, in the setting of end-of-life care                               -Continue IV diazepam to 2.5-5mg  q4hrs prn. Continue to adjust based on patient's symptom burden.                   -Secretions, in the setting of end-of-life care                               - Continue IV glycopyrrolate 0.2 mg every 4 hours as needed.  # Psychosocial Support:  -Wife  # Discharge Planning: Anticipated Hospital Death   Thank you for allowing the palliative care team to participate in the care [facility].  Alvester Morin, DO Palliative Care Provider PMT # [phone]  If patient remains symptomatic despite maximum doses, please call PMT at [phone] between 0700 and 1900. Outside of these hours, please call attending, as PMT does not have night coverage.  *Please note that this is a verbal dictation therefore any spelling or grammatical errors are due to the "Dragon Medical One" system interpretation.

## 2022-12-25 NOTE — Progress Notes (Signed)
[facility]  PROGRESS NOTE  Edwin [name] MVH:[id number] DOB: 05-15-1934 DOA: 12/24/2022 PCP: [name], DO   Brief HPI:   89 year old man with history of dementia, right hip fracture/hemiarthroplasty 05/2022, bedridden and nonverbal at baseline. Family is able to assist him into a wheelchair intermittently and he has home health assistance. He has reportedly had ongoing cough for a few weeks. There was concern that he was worsening and EMS was called.   He was admitted for sepsis.  Found to have polymicrobial bacteremia, osteomyelitis, decubitus ulcers, possible endocarditis.  After discussion with wife, now planning for comfort measures with hospice.     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 Prior hip arthroplasty with chronic periprosthetic fracture. Sacral decubitus ulcer overlying the sacrum and coccyx, loss of cortical definition of posterior cortex of distal most sacral segment suggestive of osteomyelitis  -Cultures grew E. coli, Enterococcus faecalis, Morganella morganii -ID consulted, concern for endocarditis -TTE showed normal mitral valve, aortic valve sclerosis, tricuspid valve normal in structure, pulmonic valve normal in structure -Initially plan to treat empirically for endocarditis/osteomyelitis, TEE was deferred -General surgery saw patient, s/p bedside debridement of the sacral decubitus and right lower extremity wound -Patient is now comfort care only  Acute pulmonary embolism -Initially on heparin GGT, stopped for comfort measures -Echocardiogram did not show RV strain -Lower extremity venous duplex obtained was positive for  bilateral lower extremity DVTs  Metabolic encephalopathy -Likely in setting of septic shock as above -Continue comfort measures  Goals of care -Patient's wife discussed with Dr. Lowell Guitar, patient was made comfort care.  Plan was to go home with hospice, however patient's wife expressed desire that patient can stay in the hospital for end-of-life care.  Palliative care following Anticipate hospital death  Medications        Data Reviewed:   CBG:  Recent Labs  Lab 12/21/22 1117 12/21/22 1143  GLUCAP 46* 83    SpO2: 97 %    Vitals:   12/23/22 1800 12/24/22 0514 12/24/22 0527 12/25/22 0455  BP:   (!) 121/99 104/66  Pulse: 99  98 84  Resp: 16  18 16   Temp:   97.9 F (36.6 C) 97.7 F (36.5 C)  TempSrc:   Oral Axillary  SpO2: 100%  96% 97%  Weight:  64.3 kg    Height:          Data Reviewed:  Basic Metabolic Panel: Recent Labs  Lab 01/06/2023 1807 12/19/22 0325 12/20/22 0337 12/21/22 0514  NA 137 139 140 139  K 3.1* 3.0* 3.2* 3.8  CL 104 108 110 109  CO2 24 22 23  20*  GLUCOSE 111* 98 83 55*  BUN 11 11 9 8   CREATININE 0.81 0.61 0.40* 0.60*  CALCIUM 9.5 8.9 8.2* 8.4*  MG  --  2.1 1.7 2.3  PHOS  --  1.6* 2.8 2.5    CBC: Recent Labs  Lab 01/05/2023 1807 12/19/22 0325 12/20/22 0337 12/21/22 0514  WBC 9.1 24.0* 16.3* 16.6*  NEUTROABS 8.4*  --   --   --   HGB 9.4* 8.2* 8.1* 8.8*  HCT 29.2* 25.5* 24.7* 27.9*  MCV 74.9* 76.8* 72.0* 75.8*  PLT 153 129* 140* 171    LFT Recent Labs  Lab 12/17/2022 1807 12/19/22 0325 12/21/22 0514  AST 43* 42* 28  ALT 28 26 24   ALKPHOS 73 63 59  BILITOT 1.0 0.8 0.6  PROT 7.2 5.2* 5.4*  ALBUMIN 2.2* 1.6* 1.7*     Antibiotics: Anti-infectives (From admission, onward)    Start     Dose/Rate Route Frequency Ordered Stop   12/20/22 0000  vancomycin (VANCOREADY) IVPB 1250 mg/250 mL  Status:  Discontinued        1,250 mg 166.7 mL/hr over 90 Minutes Intravenous Every 24 hours 12/19/22 0034 12/19/22 0920   12/19/22  2200  metroNIDAZOLE (FLAGYL) IVPB 500 mg  Status:  Discontinued        500 mg 100 mL/hr over 60 Minutes Intravenous 2 times daily 12/19/22 1715 12/21/22 1331   12/19/22 2000  ampicillin (OMNIPEN) 2 g in sodium chloride 0.9 % 100 mL IVPB  Status:  Discontinued        2 g 300 mL/hr over 20 Minutes Intravenous Every 4 hours 12/19/22 1715 12/21/22 1331   12/19/22 1815  cefTRIAXone (ROCEPHIN) 2 g in sodium chloride 0.9 % 100 mL IVPB  Status:  Discontinued        2 g 200 mL/hr over 30 Minutes Intravenous 2 times daily 12/19/22 1715 12/21/22 1331   12/19/22 1030  piperacillin-tazobactam (ZOSYN) IVPB 3.375 g  Status:  Discontinued        3.375 g 12.5 mL/hr over 240 Minutes Intravenous Every 8 hours 12/19/22 1015 12/19/22 1715   12/19/22 0800  ceFEPIme (MAXIPIME) 2 g in sodium chloride 0.9 % 100 mL IVPB  Status:  Discontinued        2 g 200 mL/hr over 30 Minutes Intravenous Every 8 hours 12/19/22 0034 12/19/22 0920   12/15/2022 2230  ceFEPIme (MAXIPIME) 2 g in sodium chloride 0.9 % 100 mL IVPB        2 g 200 mL/hr over 30 Minutes Intravenous  Once 12/17/2022 2222 01/02/2023 2321   01/02/2023 2230  metroNIDAZOLE (FLAGYL) IVPB 500 mg        500 mg 100 mL/hr over 60 Minutes Intravenous  Once 12/28/2022 2222 12/19/22 0010   01/01/2023 2230  vancomycin (VANCOCIN) IVPB 1000 mg/200 mL premix        1,000 mg 200 mL/hr over 60 Minutes Intravenous  Once 01/01/2023 2222 12/19/22 0122        DVT prophylaxis: SCDs  Code Status: DNR  Family Communication:    CONSULTS    Subjective    Barely opens eyes to verbal stimuli.  Objective    Physical Examination:  Barely opens eyes to verbal stimuli S1-S2, regular Lungs clear to auscultation bilaterally   Status is: Inpatient:      Pressure Injury 12/19/22 Coccyx Bilateral Unstageable - Full thickness tissue loss in which the base of the injury is covered by slough (yellow, tan, gray, green or brown) and/or eschar (tan, brown or black) in the wound bed.  15cm x 16cm w/ tunneling (Active)  12/19/22 0100  Location: Coccyx  Location Orientation: Bilateral  Staging: Unstageable - Full thickness tissue loss in which the base of the injury is covered by slough (yellow, tan, gray, green or brown) and/or eschar (tan, brown or black) in the wound bed.  Wound Description (Comments): 15cm x 16cm w/ tunneling  Present on Admission: Yes     Pressure Injury 12/19/22 Shoulder Left Unstageable - Full thickness tissue loss in which the base of the injury is covered by slough (yellow, tan, gray, green or  brown) and/or eschar (tan, brown or black) in the wound bed. 5.5cm x 2 cm; eschar (Active)  12/19/22 0100  Location: Shoulder  Location Orientation: Left  Staging: Unstageable - Full thickness tissue loss in which the base of the injury is covered by slough (yellow, tan, gray, green or brown) and/or eschar (tan, brown or black) in the wound bed.  Wound Description (Comments): 5.5cm x 2 cm; eschar  Present on Admission:      Pressure Injury 12/19/22 Heel Left Unstageable - Full thickness tissue loss in which the base of the injury is covered by slough (yellow, tan, gray, green or brown) and/or eschar (tan, brown or black) in the wound bed. 5.5 cm x 4 cm; eschar (Active)  12/19/22 0100  Location: Heel  Location Orientation: Left  Staging: Unstageable - Full thickness tissue loss in which the base of the injury is covered by slough (yellow, tan, gray, green or brown) and/or eschar (tan, brown or black) in the wound bed.  Wound Description (Comments): 5.5 cm x 4 cm; eschar  Present on Admission: Yes     Pressure Injury 12/19/22 Back Lateral;Left;Upper Unstageable - Full thickness tissue loss in which the base of the injury is covered by slough (yellow, tan, gray, green or brown) and/or eschar (tan, brown or black) in the wound bed. 5.5 cm x 2 cm; eschar (Active)  12/19/22 0100  Location: Back  Location Orientation: Lateral;Left;Upper  Staging: Unstageable -  Full thickness tissue loss in which the base of the injury is covered by slough (yellow, tan, gray, green or brown) and/or eschar (tan, brown or black) in the wound bed.  Wound Description (Comments): 5.5 cm x 2 cm; eschar  Present on Admission: Yes     Pressure Injury 12/19/22 Tibial Posterior;Right Unstageable - Full thickness tissue loss in which the base of the injury is covered by slough (yellow, tan, gray, green or brown) and/or eschar (tan, brown or black) in the wound bed. 11 cm x 5.5 cm w/ tunn (Active)  12/19/22 0100  Location: Tibial  Location Orientation: Posterior;Right  Staging: Unstageable - Full thickness tissue loss in which the base of the injury is covered by slough (yellow, tan, gray, green or brown) and/or eschar (tan, brown or black) in the wound bed.  Wound Description (Comments): 11 cm x 5.5 cm w/ tunneling  Present on Admission: Yes     Pressure Injury 12/19/22 Toe (Comment  which one) Left Unstageable - Full thickness tissue loss in which the base of the injury is covered by slough (yellow, tan, gray, green or brown) and/or eschar (tan, brown or black) in the wound bed. 2 cm x 2 cm; es (Active)  12/19/22 0100  Location: Toe (Comment  which one)  Location Orientation: Left  Staging: Unstageable - Full thickness tissue loss in which the base of the injury is covered by slough (yellow, tan, gray, green or brown) and/or eschar (tan, brown or black) in the wound bed.  Wound Description (Comments): 2 cm x 2 cm; eschar  Present on Admission:      Pressure Injury 12/21/22 Sacrum Mid Unstageable - Full thickness tissue loss in which the base of the injury is covered by slough (yellow, tan, gray, green or brown) and/or eschar (tan, brown or black) in the wound bed. *PT ONLY* unstageable, pressure in (Active)  12/21/22   Location: Sacrum  Location Orientation: Mid  Staging: Unstageable - Full thickness tissue loss in which the base of the injury is covered by slough (  yellow,  tan, gray, green or brown) and/or eschar (tan, brown or black) in the wound bed.  Wound Description (Comments): *PT ONLY* unstageable, pressure injury to sacrum, bone palpapable bse of wound  Present on Admission: Yes        Corretta Munce S Cong Hightower   [facility] If 7PM-7AM, please contact night-coverage at [email or web address], Office  [phone]   12/25/2022, 8:35 AM  LOS: 7 days

## 2022-12-26 DIAGNOSIS — Z515 Encounter for palliative care: Secondary | ICD-10-CM | POA: Diagnosis not present

## 2022-12-26 DIAGNOSIS — L89899 Pressure ulcer of other site, unspecified stage: Secondary | ICD-10-CM | POA: Diagnosis not present

## 2022-12-26 DIAGNOSIS — A419 Sepsis, unspecified organism: Secondary | ICD-10-CM | POA: Diagnosis not present

## 2022-12-26 DIAGNOSIS — F419 Anxiety disorder, unspecified: Secondary | ICD-10-CM | POA: Diagnosis not present

## 2022-12-26 DIAGNOSIS — I2699 Other pulmonary embolism without acute cor pulmonale: Secondary | ICD-10-CM | POA: Diagnosis not present

## 2022-12-26 DIAGNOSIS — J189 Pneumonia, unspecified organism: Secondary | ICD-10-CM | POA: Diagnosis not present

## 2022-12-26 MED ORDER — GLYCOPYRROLATE 0.2 MG/ML IJ SOLN
0.2000 mg | Freq: Four times a day (QID) | INTRAMUSCULAR | Status: DC
  Administered 2022-12-26 – 2023-01-01 (×24): 0.2 mg via INTRAVENOUS
  Filled 2022-12-26 (×24): qty 1

## 2022-12-26 NOTE — Plan of Care (Signed)
Pt non-responsive, agonal breathing with pauses lengthening throughout shift.  Oral care provided, when swabbing mouth patient bit down on swab clenching teeth, took some time for pt to release swab.  Lip moisturizer applied each rounding.  Q2 turns for comfort, attempting to minimize the degree of turning as patient moans with movement. Morphine drip effective for ease of breathing and pain management.

## 2022-12-26 NOTE — Progress Notes (Signed)
Daily Progress Note   Patient Name: Edwin [name]       Date: 12/26/2022 DOB: Jan 18, 1934  Age: 89 y.o. MRN#: [id number] Attending Physician: Meredeth Ide, MD Primary Care Physician: [name], DO Admit Date: 12/10/2022 Length of Stay: 8 days  Reason for Consultation/Follow-up: Establishing goals of care and symptom management  Subjective:   CC: Patient unresponsive though moaning without being touched this afternoon. No family at bedside.  Following up regarding complex medical decision making and symptom management.  Subjective:  Reviewed EMR prior to presenting to bedside.  Patient continues to receive IV morphine continuous infusion at 2mg /hr. RN overnight documented agonal breathing and moaning at times.   Presented to bedside to patient in the afternoon. Patient unresponsive to voice. At times moaning without being touched. Agonal breathing present. No family present at bedside. Secretions heard.  Discussed care and medication management with bedside RN.   Objective:   Vital Signs:  BP 132/61 (BP Location: Left Leg)   Pulse 86   Temp 98.1 F (36.7 C) (Oral)   Resp 14   Ht 5\' 11"  (1.803 m)   Wt 64.3 kg   SpO2 (!) 82%   BMI 19.77 kg/m   Physical Exam: General: cachetic, frail, laying in bed, unresponsive, moaning spontaneously at times Eyes: No drainage noted HENT: secretions heard Cardiovascular: RRR Respiratory: slight increased work of breathing noted, not in respiratory distress Extremities: Multiple wounds present with bandages in place Neuro: Unresponsive  I personally reviewed recent imaging.   Assessment & Plan:   Assessment: Patient is an 89 year old male with a past medical history of dementia, right hip fracture/ME arthroplasty 1223, bed ridden, and nonverbal at baseline who was admitted on 12/11/2022 for management of ongoing cough.  During hospitalization patient initially managed for septic shock due to polymicrobial bacteremia, acute  pulmonary embolism, and metabolic encephalopathy.  During hospitalization after conversations with patient's family, patient transition to comfort focused care on 12/21/2022.  Palliative medicine team consulted to assist with complex medical decision making and symptom management.  Recommendations/Plan: # Complex medical decision making/goals of care:  - Patient unable to participate in complex medical decision-making due to medical status.  -Patient was transition to full comfort focused care on 12/21/2022 after hospitalist discussed care with patient's wife.  Continuing comfort focused care only at this time to maintain symptom management and dignity at the end of life. Have been discussing care with patient's wife. Anticipate in-hospital death.  -  Code Status: DNR  # Symptom management  -Pain/Dyspnea, acute in the setting of end-of-life care -Continue IV morphine continuous  infusion at 2-5mg /hr titration with RN bolus from infusion at 2mg  q30 mins prn for breakthrough pain/dyspnea. Discussed with RN today who will continue to titrate (increasing dose to 3mg /hr at this time) based on signs of pain.                 -Anxiety/agitation, in the setting of end-of-life care                               -Continue IV diazepam to 2.5-5mg  q4hrs prn. Continue to adjust based on patient's symptom burden.                   -Secretions, in the setting of end-of-life care                               -  Start IV glycopyrrolate 0.2 mg scheduled every 6 hours.  # Psychosocial Support:  -Wife  # Discharge Planning: Anticipated Hospital Death   Thank you for allowing the palliative care team to participate in the care [facility].  Alvester Morin, DO Palliative Care Provider PMT # [phone]  If patient remains symptomatic despite maximum doses, please call PMT at [phone] between 0700 and 1900. Outside of these hours, please call attending, as PMT does not have night coverage.  *Please note  that this is a verbal dictation therefore any spelling or grammatical errors are due to the "[employer]" system interpretation.

## 2022-12-26 NOTE — Plan of Care (Signed)

## 2022-12-26 NOTE — Progress Notes (Signed)
[facility]  PROGRESS NOTE  Edwin [name] JXB:[id number] DOB: 1933-12-12 DOA: 01/01/2023 PCP: [name], DO   Brief HPI:   89 year old man with history of dementia, right hip fracture/hemiarthroplasty 05/2022, bedridden and nonverbal at baseline. Family is able to assist him into a wheelchair intermittently and he has home health assistance. He has reportedly had ongoing cough for a few weeks. There was concern that he was worsening and EMS was called.   He was admitted for sepsis.  Found to have polymicrobial bacteremia, osteomyelitis, decubitus ulcers, possible endocarditis.  After discussion with wife, now planning for comfort measures with hospice.     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 Prior hip arthroplasty with chronic periprosthetic fracture. Sacral decubitus ulcer overlying the sacrum and coccyx, loss of cortical definition of posterior cortex of distal most sacral segment suggestive of osteomyelitis  -Cultures grew E. coli, Enterococcus faecalis, Morganella morganii -ID consulted, concern for endocarditis -TTE showed normal mitral valve, aortic valve sclerosis, tricuspid valve normal in structure, pulmonic valve normal in structure -Initially plan to treat empirically for endocarditis/osteomyelitis, TEE was deferred -General surgery saw patient, Edwin/p bedside debridement of the sacral decubitus and right lower extremity wound -Patient is now comfort care only  Acute pulmonary embolism -Initially on heparin GGT, stopped for comfort measures -Echocardiogram did not show RV strain -Lower extremity venous duplex obtained was positive for  bilateral lower extremity DVTs  Metabolic encephalopathy -Likely in setting of septic shock as above -Continue comfort measures  Goals of care -Patient'Edwin wife discussed with Dr. Lowell Guitar, patient was made comfort care.  Plan was to go home with hospice, however patient'Edwin wife expressed desire that patient can stay in the hospital for end-of-life care.  Palliative care following.  Started on morphine gtt. Anticipate hospital death  Medications        Data Reviewed:   CBG:  Recent Labs  Lab 12/21/22 1117 12/21/22 1143  GLUCAP 46* 83    SpO2: (!) 82 %    Vitals:   12/24/22 0527 12/25/22 0455 12/25/22 1822 12/26/22 0512  BP: (!) 121/99 104/66  132/61  Pulse: 98 84 (!) 44 86  Resp: 18 16 (!) 6 14  Temp: 97.9 F (36.6 C) 97.7 F (36.5 C)  98.1 F (36.7 C)  TempSrc: Oral Axillary  Oral  SpO2: 96% 97%  (!) 82%  Weight:      Height:          Data Reviewed:  Basic Metabolic Panel: Recent Labs  Lab 12/20/22 0337 12/21/22 0514  NA 140 139  K 3.2* 3.8  CL 110 109  CO2 23 20*  GLUCOSE 83 55*  BUN 9 8  CREATININE 0.40* 0.60*  CALCIUM 8.2* 8.4*  MG 1.7 2.3  PHOS 2.8 2.5    CBC: Recent Labs  Lab 12/20/22 0337 12/21/22 0514  WBC 16.3* 16.6*  HGB 8.1* 8.8*  HCT 24.7* 27.9*  MCV 72.0* 75.8*  PLT 140* 171    LFT Recent Labs  Lab 12/21/22 0514  AST 28  ALT 24  ALKPHOS 59  BILITOT 0.6  PROT 5.4*  ALBUMIN 1.7*     Antibiotics: Anti-infectives (From admission, onward)    Start     Dose/Rate Route Frequency Ordered  Stop   12/20/22 0000  vancomycin (VANCOREADY) IVPB 1250 mg/250 mL  Status:  Discontinued        1,250 mg 166.7 mL/hr over 90 Minutes Intravenous Every 24 hours 12/19/22 0034 12/19/22 0920   12/19/22 2200  metroNIDAZOLE (FLAGYL) IVPB 500 mg  Status:  Discontinued        500 mg 100 mL/hr over 60 Minutes Intravenous 2 times daily 12/19/22 1715 12/21/22 1331   12/19/22 2000  ampicillin (OMNIPEN) 2 g in sodium chloride 0.9 % 100 mL IVPB   Status:  Discontinued        2 g 300 mL/hr over 20 Minutes Intravenous Every 4 hours 12/19/22 1715 12/21/22 1331   12/19/22 1815  cefTRIAXone (ROCEPHIN) 2 g in sodium chloride 0.9 % 100 mL IVPB  Status:  Discontinued        2 g 200 mL/hr over 30 Minutes Intravenous 2 times daily 12/19/22 1715 12/21/22 1331   12/19/22 1030  piperacillin-tazobactam (ZOSYN) IVPB 3.375 g  Status:  Discontinued        3.375 g 12.5 mL/hr over 240 Minutes Intravenous Every 8 hours 12/19/22 1015 12/19/22 1715   12/19/22 0800  ceFEPIme (MAXIPIME) 2 g in sodium chloride 0.9 % 100 mL IVPB  Status:  Discontinued        2 g 200 mL/hr over 30 Minutes Intravenous Every 8 hours 12/19/22 0034 12/19/22 0920   01/02/2023 2230  ceFEPIme (MAXIPIME) 2 g in sodium chloride 0.9 % 100 mL IVPB        2 g 200 mL/hr over 30 Minutes Intravenous  Once 12/25/2022 2222 12/30/2022 2321   12/31/2022 2230  metroNIDAZOLE (FLAGYL) IVPB 500 mg        500 mg 100 mL/hr over 60 Minutes Intravenous  Once 12/07/2022 2222 12/19/22 0010   12/24/2022 2230  vancomycin (VANCOCIN) IVPB 1000 mg/200 mL premix        1,000 mg 200 mL/hr over 60 Minutes Intravenous  Once 12/15/2022 2222 12/19/22 0122        DVT prophylaxis: SCDs  Code Status: DNR  Family Communication:    CONSULTS    Subjective   Started on morphine gtt. for comfort  Objective    Physical Examination:  Unresponsive S1-S2, regular Lungs clear to auscultation bilaterally  Status is: Inpatient:      Pressure Injury 12/19/22 Coccyx Bilateral Unstageable - Full thickness tissue loss in which the base of the injury is covered by slough (yellow, tan, gray, green or brown) and/or eschar (tan, brown or black) in the wound bed. 15cm x 16cm w/ tunneling (Active)  12/19/22 0100  Location: Coccyx  Location Orientation: Bilateral  Staging: Unstageable - Full thickness tissue loss in which the base of the injury is covered by slough (yellow, tan, gray, green or brown) and/or eschar (tan,  brown or black) in the wound bed.  Wound Description (Comments): 15cm x 16cm w/ tunneling  Present on Admission: Yes     Pressure Injury 12/19/22 Shoulder Left Unstageable - Full thickness tissue loss in which the base of the injury is covered by slough (yellow, tan, gray, green or brown) and/or eschar (tan, brown or black) in the wound bed. 5.5cm x 2 cm; eschar (Active)  12/19/22 0100  Location: Shoulder  Location Orientation: Left  Staging: Unstageable - Full thickness tissue loss in which the base of the injury is covered by slough (yellow, tan, gray, green or brown) and/or eschar (tan, brown or black) in the wound bed.  Wound Description (  Comments): 5.5cm x 2 cm; eschar  Present on Admission:      Pressure Injury 12/19/22 Heel Left Unstageable - Full thickness tissue loss in which the base of the injury is covered by slough (yellow, tan, gray, green or brown) and/or eschar (tan, brown or black) in the wound bed. 5.5 cm x 4 cm; eschar (Active)  12/19/22 0100  Location: Heel  Location Orientation: Left  Staging: Unstageable - Full thickness tissue loss in which the base of the injury is covered by slough (yellow, tan, gray, green or brown) and/or eschar (tan, brown or black) in the wound bed.  Wound Description (Comments): 5.5 cm x 4 cm; eschar  Present on Admission: Yes     Pressure Injury 12/19/22 Back Lateral;Left;Upper Unstageable - Full thickness tissue loss in which the base of the injury is covered by slough (yellow, tan, gray, green or brown) and/or eschar (tan, brown or black) in the wound bed. 5.5 cm x 2 cm; eschar (Active)  12/19/22 0100  Location: Back  Location Orientation: Lateral;Left;Upper  Staging: Unstageable - Full thickness tissue loss in which the base of the injury is covered by slough (yellow, tan, gray, green or brown) and/or eschar (tan, brown or black) in the wound bed.  Wound Description (Comments): 5.5 cm x 2 cm; eschar  Present on Admission: Yes     Pressure  Injury 12/19/22 Tibial Posterior;Right Unstageable - Full thickness tissue loss in which the base of the injury is covered by slough (yellow, tan, gray, green or brown) and/or eschar (tan, brown or black) in the wound bed. 11 cm x 5.5 cm w/ tunn (Active)  12/19/22 0100  Location: Tibial  Location Orientation: Posterior;Right  Staging: Unstageable - Full thickness tissue loss in which the base of the injury is covered by slough (yellow, tan, gray, green or brown) and/or eschar (tan, brown or black) in the wound bed.  Wound Description (Comments): 11 cm x 5.5 cm w/ tunneling  Present on Admission: Yes     Pressure Injury 12/19/22 Toe (Comment  which one) Left Unstageable - Full thickness tissue loss in which the base of the injury is covered by slough (yellow, tan, gray, green or brown) and/or eschar (tan, brown or black) in the wound bed. 2 cm x 2 cm; es (Active)  12/19/22 0100  Location: Toe (Comment  which one)  Location Orientation: Left  Staging: Unstageable - Full thickness tissue loss in which the base of the injury is covered by slough (yellow, tan, gray, green or brown) and/or eschar (tan, brown or black) in the wound bed.  Wound Description (Comments): 2 cm x 2 cm; eschar  Present on Admission:      Pressure Injury 12/21/22 Sacrum Mid Unstageable - Full thickness tissue loss in which the base of the injury is covered by slough (yellow, tan, gray, green or brown) and/or eschar (tan, brown or black) in the wound bed. *PT ONLY* unstageable, pressure in (Active)  12/21/22   Location: Sacrum  Location Orientation: Mid  Staging: Unstageable - Full thickness tissue loss in which the base of the injury is covered by slough (yellow, tan, gray, green or brown) and/or eschar (tan, brown or black) in the wound bed.  Wound Description (Comments): *PT ONLY* unstageable, pressure injury to sacrum, bone palpapable bse of wound  Present on Admission: Yes        Edwin [name] Edwin [name]   [facility] If 7PM-7AM, please contact night-coverage at [email or web address], Office  [phone]   12/26/2022,  8:55 AM  LOS: 8 days

## 2022-12-26 NOTE — Progress Notes (Signed)
[facility] Liaison Note  Anticipated Hospital Death. [facility] will sign off.  Please reach out if further needs.  Thank you, Haynes Bast, BSN, [facility] [phone]

## 2022-12-27 DIAGNOSIS — Z79899 Other long term (current) drug therapy: Secondary | ICD-10-CM

## 2022-12-27 DIAGNOSIS — M4628 Osteomyelitis of vertebra, sacral and sacrococcygeal region: Secondary | ICD-10-CM

## 2022-12-27 DIAGNOSIS — L89899 Pressure ulcer of other site, unspecified stage: Secondary | ICD-10-CM | POA: Diagnosis not present

## 2022-12-27 DIAGNOSIS — Z515 Encounter for palliative care: Secondary | ICD-10-CM | POA: Diagnosis not present

## 2022-12-27 DIAGNOSIS — R52 Pain, unspecified: Secondary | ICD-10-CM

## 2022-12-27 DIAGNOSIS — Z7189 Other specified counseling: Secondary | ICD-10-CM

## 2022-12-27 DIAGNOSIS — I2699 Other pulmonary embolism without acute cor pulmonale: Secondary | ICD-10-CM | POA: Diagnosis not present

## 2022-12-27 DIAGNOSIS — J189 Pneumonia, unspecified organism: Secondary | ICD-10-CM | POA: Diagnosis not present

## 2022-12-27 DIAGNOSIS — Z66 Do not resuscitate: Secondary | ICD-10-CM

## 2022-12-27 NOTE — Progress Notes (Signed)
Patient is obtunded and family not at bedside. Unable to finish the rest of patient's admission history at this time.Edwin [name]

## 2022-12-27 NOTE — Progress Notes (Signed)
[facility]  PROGRESS NOTE  Jaxtyn Linville NWG:[id number] DOB: 02-19-1934 DOA: 12/16/2022 PCP: [name], DO   Brief HPI:   89 year old man with history of dementia, right hip fracture/hemiarthroplasty 05/2022, bedridden and nonverbal at baseline. Family is able to assist him into a wheelchair intermittently and he has home health assistance. He has reportedly had ongoing cough for a few weeks. There was concern that he was worsening and EMS was called.   He was admitted for sepsis.  Found to have polymicrobial bacteremia, osteomyelitis, decubitus ulcers, possible endocarditis.  After discussion with wife, now planning for comfort measures with hospice.     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 Prior hip arthroplasty with chronic periprosthetic fracture. Sacral decubitus ulcer overlying the sacrum and coccyx, loss of cortical definition of posterior cortex of distal most sacral segment suggestive of osteomyelitis  -Cultures grew E. coli, Enterococcus faecalis, Morganella morganii -ID consulted, concern for endocarditis -TTE showed normal mitral valve, aortic valve sclerosis, tricuspid valve normal in structure, pulmonic valve normal in structure -Initially plan to treat empirically for endocarditis/osteomyelitis, TEE was deferred -General surgery saw patient, s/p bedside debridement of the sacral decubitus and right lower extremity wound -Patient is now comfort care only  Acute pulmonary embolism -Initially on heparin GGT, stopped for comfort measures -Echocardiogram did not show RV strain -Lower extremity venous duplex obtained was positive for  bilateral lower extremity DVTs  Metabolic encephalopathy -Likely in setting of septic shock as above -Continue comfort measures  Goals of care -Patient's wife discussed with Dr. Lowell Guitar, patient was made comfort care.  Plan was to go home with hospice, however patient's wife expressed desire that patient can stay in the hospital for end-of-life care.  Palliative care following.  Started on morphine gtt. Anticipate hospital death  Medications     glycopyrrolate  0.2 mg Intravenous Q6H      Data Reviewed:   CBG:  Recent Labs  Lab 12/21/22 1117 12/21/22 1143  GLUCAP 46* 83    SpO2: (!) 86 %    Vitals:   12/25/22 0455 12/25/22 1822 12/26/22 0512 12/27/22 0556  BP: 104/66  132/61 99/60  Pulse: 84 (!) 44 86 98  Resp: 16 (!) 6 14 (!) 5  Temp: 97.7 F (36.5 C)  98.1 F (36.7 C) 97.8 F (36.6 C)  TempSrc: Axillary  Oral Oral  SpO2: 97%  (!) 82% (!) 86%  Weight:      Height:          Data Reviewed:  Basic Metabolic Panel: Recent Labs  Lab 12/21/22 0514  NA 139  K 3.8  CL 109  CO2 20*  GLUCOSE 55*  BUN 8  CREATININE 0.60*  CALCIUM 8.4*  MG 2.3  PHOS 2.5    CBC: Recent Labs  Lab 12/21/22 0514  WBC 16.6*  HGB 8.8*  HCT 27.9*  MCV 75.8*  PLT 171    LFT Recent Labs  Lab 12/21/22 0514  AST 28  ALT 24  ALKPHOS 59  BILITOT 0.6  PROT 5.4*  ALBUMIN 1.7*     Antibiotics: Anti-infectives (From admission, onward)    Start     Dose/Rate Route Frequency Ordered Stop   12/20/22 0000  vancomycin (VANCOREADY) IVPB 1250  mg/250 mL  Status:  Discontinued        1,250 mg 166.7 mL/hr over 90 Minutes Intravenous Every 24 hours 12/19/22 0034 12/19/22 0920   12/19/22 2200  metroNIDAZOLE (FLAGYL) IVPB 500 mg  Status:  Discontinued        500 mg 100 mL/hr over 60 Minutes Intravenous 2 times daily 12/19/22 1715 12/21/22 1331   12/19/22 2000  ampicillin (OMNIPEN) 2 g in sodium chloride 0.9 % 100 mL IVPB  Status:  Discontinued        2 g 300 mL/hr over 20  Minutes Intravenous Every 4 hours 12/19/22 1715 12/21/22 1331   12/19/22 1815  cefTRIAXone (ROCEPHIN) 2 g in sodium chloride 0.9 % 100 mL IVPB  Status:  Discontinued        2 g 200 mL/hr over 30 Minutes Intravenous 2 times daily 12/19/22 1715 12/21/22 1331   12/19/22 1030  piperacillin-tazobactam (ZOSYN) IVPB 3.375 g  Status:  Discontinued        3.375 g 12.5 mL/hr over 240 Minutes Intravenous Every 8 hours 12/19/22 1015 12/19/22 1715   12/19/22 0800  ceFEPIme (MAXIPIME) 2 g in sodium chloride 0.9 % 100 mL IVPB  Status:  Discontinued        2 g 200 mL/hr over 30 Minutes Intravenous Every 8 hours 12/19/22 0034 12/19/22 0920   01/04/2023 2230  ceFEPIme (MAXIPIME) 2 g in sodium chloride 0.9 % 100 mL IVPB        2 g 200 mL/hr over 30 Minutes Intravenous  Once 01/02/2023 2222 12/11/2022 2321   12/09/2022 2230  metroNIDAZOLE (FLAGYL) IVPB 500 mg        500 mg 100 mL/hr over 60 Minutes Intravenous  Once 12/23/2022 2222 12/19/22 0010   12/26/2022 2230  vancomycin (VANCOCIN) IVPB 1000 mg/200 mL premix        1,000 mg 200 mL/hr over 60 Minutes Intravenous  Once 12/15/2022 2222 12/19/22 0122        DVT prophylaxis: SCDs  Code Status: DNR  Family Communication:    CONSULTS    Subjective   Started on morphine gtt. for comfort.  Objective    Physical Examination:  Unresponsive, appears comfortable  Status is: Inpatient:      Pressure Injury 12/19/22 Coccyx Bilateral Unstageable - Full thickness tissue loss in which the base of the injury is covered by slough (yellow, tan, gray, green or brown) and/or eschar (tan, brown or black) in the wound bed. 15cm x 16cm w/ tunneling (Active)  12/19/22 0100  Location: Coccyx  Location Orientation: Bilateral  Staging: Unstageable - Full thickness tissue loss in which the base of the injury is covered by slough (yellow, tan, gray, green or brown) and/or eschar (tan, brown or black) in the wound bed.  Wound Description (Comments): 15cm x 16cm w/ tunneling   Present on Admission: Yes     Pressure Injury 12/19/22 Shoulder Left Unstageable - Full thickness tissue loss in which the base of the injury is covered by slough (yellow, tan, gray, green or brown) and/or eschar (tan, brown or black) in the wound bed. 5.5cm x 2 cm; eschar (Active)  12/19/22 0100  Location: Shoulder  Location Orientation: Left  Staging: Unstageable - Full thickness tissue loss in which the base of the injury is covered by slough (yellow, tan, gray, green or brown) and/or eschar (tan, brown or black) in the wound bed.  Wound Description (Comments): 5.5cm x 2 cm; eschar  Present on Admission:  Pressure Injury 12/19/22 Heel Left Unstageable - Full thickness tissue loss in which the base of the injury is covered by slough (yellow, tan, gray, green or brown) and/or eschar (tan, brown or black) in the wound bed. 5.5 cm x 4 cm; eschar (Active)  12/19/22 0100  Location: Heel  Location Orientation: Left  Staging: Unstageable - Full thickness tissue loss in which the base of the injury is covered by slough (yellow, tan, gray, green or brown) and/or eschar (tan, brown or black) in the wound bed.  Wound Description (Comments): 5.5 cm x 4 cm; eschar  Present on Admission: Yes     Pressure Injury 12/19/22 Back Lateral;Left;Upper Unstageable - Full thickness tissue loss in which the base of the injury is covered by slough (yellow, tan, gray, green or brown) and/or eschar (tan, brown or black) in the wound bed. 5.5 cm x 2 cm; eschar (Active)  12/19/22 0100  Location: Back  Location Orientation: Lateral;Left;Upper  Staging: Unstageable - Full thickness tissue loss in which the base of the injury is covered by slough (yellow, tan, gray, green or brown) and/or eschar (tan, brown or black) in the wound bed.  Wound Description (Comments): 5.5 cm x 2 cm; eschar  Present on Admission: Yes     Pressure Injury 12/19/22 Tibial Posterior;Right Unstageable - Full thickness tissue loss in which  the base of the injury is covered by slough (yellow, tan, gray, green or brown) and/or eschar (tan, brown or black) in the wound bed. 11 cm x 5.5 cm w/ tunn (Active)  12/19/22 0100  Location: Tibial  Location Orientation: Posterior;Right  Staging: Unstageable - Full thickness tissue loss in which the base of the injury is covered by slough (yellow, tan, gray, green or brown) and/or eschar (tan, brown or black) in the wound bed.  Wound Description (Comments): 11 cm x 5.5 cm w/ tunneling  Present on Admission: Yes     Pressure Injury 12/19/22 Toe (Comment  which one) Left Unstageable - Full thickness tissue loss in which the base of the injury is covered by slough (yellow, tan, gray, green or brown) and/or eschar (tan, brown or black) in the wound bed. 2 cm x 2 cm; es (Active)  12/19/22 0100  Location: Toe (Comment  which one)  Location Orientation: Left  Staging: Unstageable - Full thickness tissue loss in which the base of the injury is covered by slough (yellow, tan, gray, green or brown) and/or eschar (tan, brown or black) in the wound bed.  Wound Description (Comments): 2 cm x 2 cm; eschar  Present on Admission:      Pressure Injury 12/21/22 Sacrum Mid Unstageable - Full thickness tissue loss in which the base of the injury is covered by slough (yellow, tan, gray, green or brown) and/or eschar (tan, brown or black) in the wound bed. *PT ONLY* unstageable, pressure in (Active)  12/21/22   Location: Sacrum  Location Orientation: Mid  Staging: Unstageable - Full thickness tissue loss in which the base of the injury is covered by slough (yellow, tan, gray, green or brown) and/or eschar (tan, brown or black) in the wound bed.  Wound Description (Comments): *PT ONLY* unstageable, pressure injury to sacrum, bone palpapable bse of wound  Present on Admission: Yes        Toua Stites S Jilda Kress   [facility] If 7PM-7AM, please contact night-coverage at [email or web address], Office   [phone]   12/27/2022, 8:08 AM  LOS: 9 days

## 2022-12-27 NOTE — Progress Notes (Signed)
Daily Progress Note   Patient Name: Edwin [name]       Date: 12/27/2022 DOB: 07/18/1933  Age: 89 y.o. MRN#: [id number] Attending Physician: Meredeth Ide, MD Primary Care Physician: [name], DO Admit Date: 12/14/2022 Length of Stay: 9 days  Reason for Consultation/Follow-up: Establishing goals of care and symptom management  Subjective:   CC: Patient unresponsive when seen. No family at bedside.  Following up regarding complex medical decision making and symptom management.  Subjective:  Reviewed EMR prior to presenting to bedside. Patient continues on IV morphine infusion 3mg /hr.   When seeing patient today he appears comfortable.  Long periods of apnea noted.  No family present at bedside.  Discussed care with bedside RN for updates.  Minimal urine output.  No oral intake.  Objective:   Vital Signs:  BP 99/60 (BP Location: Left Arm)   Pulse 98   Temp 97.8 F (36.6 C) (Oral)   Resp (!) 5   Ht 5\' 11"  (1.803 m)   Wt 64.3 kg   SpO2 (!) 86%   BMI 19.77 kg/m   Physical Exam: General: cachetic, frail, laying in bed, unresponsive Eyes: No drainage noted HENT: secretions heard Cardiovascular: RRR Respiratory: Long periods of apnea Extremities: Multiple wounds present with bandages in place Neuro: Unresponsive  I personally reviewed recent imaging.   Assessment & Plan:   Assessment: Patient is an 89 year old male with a past medical history of dementia, right hip fracture/ME arthroplasty 1223, bed ridden, and nonverbal at baseline who was admitted on 12/10/2022 for management of ongoing cough.  During hospitalization patient initially managed for septic shock due to polymicrobial bacteremia, acute pulmonary embolism, and metabolic encephalopathy.  During hospitalization after conversations with patient's family, patient transition to comfort focused care on 12/21/2022.  Palliative medicine team consulted to assist with complex medical decision making and symptom  management.  Recommendations/Plan: # Complex medical decision making/goals of care:  - Patient unable to participate in complex medical decision-making due to medical status.  -Patient was transition to full comfort focused care on 12/21/2022 after hospitalist discussed care with patient's wife.  Continuing comfort focused care only at this time to maintain symptom management and dignity at the end of life. Care plan has been discussed with wife.  Anticipate in-hospital death.  -  Code Status: DNR  # Symptom management  -Pain/Dyspnea, acute in the setting of end-of-life care -Continue IV morphine continuous  infusion at 2-5mg /hr titration with RN bolus from infusion at 2mg  q30 mins prn for breakthrough pain/dyspnea. Currently at 3mg /hr continuous infusion.                  -Anxiety/agitation, in the setting of end-of-life care                               -Continue IV diazepam to 2.5-5mg  q4hrs prn. Continue to adjust based on patient's symptom burden.                   -Secretions, in the setting of end-of-life care                               - Continue IV glycopyrrolate 0.2 mg scheduled every 6 hours.  # Psychosocial Support:  -Wife  # Discharge Planning: Anticipated Hospital Death   Thank you for allowing the palliative care team to participate in the care  Ria Comment.  Alvester Morin, DO Palliative Care Provider PMT # [phone]  If patient remains symptomatic despite maximum doses, please call PMT at [phone] between 0700 and 1900. Outside of these hours, please call attending, as PMT does not have night coverage.  *Please note that this is a verbal dictation therefore any spelling or grammatical errors are due to the "[employer]" system interpretation.

## 2022-12-28 DIAGNOSIS — A419 Sepsis, unspecified organism: Secondary | ICD-10-CM | POA: Diagnosis not present

## 2022-12-28 DIAGNOSIS — R6521 Severe sepsis with septic shock: Secondary | ICD-10-CM | POA: Diagnosis not present

## 2022-12-28 DIAGNOSIS — Z515 Encounter for palliative care: Secondary | ICD-10-CM | POA: Diagnosis not present

## 2022-12-28 DIAGNOSIS — I2699 Other pulmonary embolism without acute cor pulmonale: Secondary | ICD-10-CM | POA: Diagnosis not present

## 2022-12-28 DIAGNOSIS — L89899 Pressure ulcer of other site, unspecified stage: Secondary | ICD-10-CM | POA: Diagnosis not present

## 2022-12-28 NOTE — Progress Notes (Signed)
[facility]  PROGRESS NOTE  Edwin [name] HYQ:[id number] DOB: August 08, 1933 DOA: 12/13/2022 PCP: [name], DO   Brief HPI:   89 year old man with history of dementia, right hip fracture/hemiarthroplasty 05/2022, bedridden and nonverbal at baseline. Family is able to assist him into a wheelchair intermittently and he has home health assistance. He has reportedly had ongoing cough for a few weeks. There was concern that he was worsening and EMS was called.   He was admitted for sepsis.  Found to have polymicrobial bacteremia, osteomyelitis, decubitus ulcers, possible endocarditis.  After discussion with wife, now planning for comfort measures with hospice.     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 Prior hip arthroplasty with chronic periprosthetic fracture. Sacral decubitus ulcer overlying the sacrum and coccyx, loss of cortical definition of posterior cortex of distal most sacral segment suggestive of osteomyelitis  -Cultures grew E. coli, Enterococcus faecalis, Morganella morganii -ID consulted, concern for endocarditis -TTE showed normal mitral valve, aortic valve sclerosis, tricuspid valve normal in structure, pulmonic valve normal in structure -Initially plan to treat empirically for endocarditis/osteomyelitis, TEE was deferred -General surgery saw patient, s/p bedside debridement of the sacral decubitus and right lower extremity wound -Patient is now comfort care only  Acute pulmonary embolism -Initially on heparin GGT, stopped for comfort measures -Echocardiogram did not show RV strain -Lower extremity venous duplex obtained was positive for  bilateral lower extremity DVTs  Metabolic encephalopathy -Likely in setting of septic shock as above -Continue comfort measures  Goals of care -Patient's wife discussed with Dr. Lowell Guitar, patient was made comfort care.  Plan was to go home with hospice, however patient's wife expressed desire that patient can stay in the hospital for end-of-life care.  Palliative care following.  Started on morphine gtt. Anticipate hospital death  Medications     glycopyrrolate  0.2 mg Intravenous Q6H      Data Reviewed:   CBG:  Recent Labs  Lab 12/21/22 1117 12/21/22 1143  GLUCAP 46* 83    SpO2: 90 %    Vitals:   12/25/22 1822 12/26/22 0512 12/27/22 0556 12/28/22 0713  BP:  132/61 99/60 110/68  Pulse: (!) 44 86 98 (!) 105  Resp: (!) 6 14 (!) 5 (!) 5  Temp:  98.1 F (36.7 C) 97.8 F (36.6 C) (!) 97.5 F (36.4 C)  TempSrc:  Oral Oral Oral  SpO2:  (!) 82% (!) 86% 90%  Weight:      Height:          Data Reviewed:  Basic Metabolic Panel: No results for input(s): "NA", "K", "CL", "CO2", "GLUCOSE", "BUN", "CREATININE", "CALCIUM", "MG", "PHOS" in the last 168 hours.   CBC: No results for input(s): "WBC", "NEUTROABS", "HGB", "HCT", "MCV", "PLT" in the last 168 hours.   LFT No results for input(s): "AST", "ALT", "ALKPHOS", "BILITOT", "PROT", "ALBUMIN" in the last 168 hours.    Antibiotics: Anti-infectives (From admission, onward)    Start     Dose/Rate Route Frequency Ordered Stop   12/20/22 0000  vancomycin (VANCOREADY) IVPB 1250 mg/250 mL  Status:  Discontinued        1,250 mg 166.7 mL/hr over 90 Minutes Intravenous Every 24 hours 12/19/22 0034 12/19/22 0920   12/19/22 2200  metroNIDAZOLE (FLAGYL) IVPB 500 mg  Status:  Discontinued        500 mg 100 mL/hr over 60 Minutes Intravenous 2 times daily 12/19/22 1715 12/21/22 1331   12/19/22 2000  ampicillin (OMNIPEN) 2 g in sodium chloride 0.9 % 100 mL IVPB  Status:  Discontinued        2 g 300 mL/hr over 20 Minutes  Intravenous Every 4 hours 12/19/22 1715 12/21/22 1331   12/19/22 1815  cefTRIAXone (ROCEPHIN) 2 g in sodium chloride 0.9 % 100 mL IVPB  Status:  Discontinued        2 g 200 mL/hr over 30 Minutes Intravenous 2 times daily 12/19/22 1715 12/21/22 1331   12/19/22 1030  piperacillin-tazobactam (ZOSYN) IVPB 3.375 g  Status:  Discontinued        3.375 g 12.5 mL/hr over 240 Minutes Intravenous Every 8 hours 12/19/22 1015 12/19/22 1715   12/19/22 0800  ceFEPIme (MAXIPIME) 2 g in sodium chloride 0.9 % 100 mL IVPB  Status:  Discontinued        2 g 200 mL/hr over 30 Minutes Intravenous Every 8 hours 12/19/22 0034 12/19/22 0920   12/17/2022 2230  ceFEPIme (MAXIPIME) 2 g in sodium chloride 0.9 % 100 mL IVPB        2 g 200 mL/hr over 30 Minutes Intravenous  Once 12/22/2022 2222 12/27/2022 2321   12/17/2022 2230  metroNIDAZOLE (FLAGYL) IVPB 500 mg        500 mg 100 mL/hr over 60 Minutes Intravenous  Once 12/11/2022 2222 12/19/22 0010   12/17/2022 2230  vancomycin (VANCOCIN) IVPB 1000 mg/200 mL premix        1,000 mg 200 mL/hr over 60 Minutes Intravenous  Once 01/05/2023 2222 12/19/22 0122        DVT prophylaxis: SCDs  Code Status: DNR  Family Communication:    CONSULTS    Subjective   Continues to be unresponsive, on morphine gtt.  Objective    Physical Examination:  Unresponsive Appears comfortable  Status is: Inpatient:      Pressure Injury 12/19/22 Coccyx Bilateral Unstageable - Full thickness tissue loss in which the base of the injury is covered by slough (yellow, tan, gray, green or brown) and/or eschar (tan, brown or black) in the wound bed. 15cm x 16cm w/ tunneling (Active)  12/19/22 0100  Location: Coccyx  Location Orientation: Bilateral  Staging: Unstageable - Full thickness tissue loss in which the base of the injury is covered by slough (yellow, tan, gray, green or brown) and/or eschar (tan, brown or black) in the wound bed.  Wound Description (Comments): 15cm x 16cm w/ tunneling   Present on Admission: Yes     Pressure Injury 12/19/22 Shoulder Left Unstageable - Full thickness tissue loss in which the base of the injury is covered by slough (yellow, tan, gray, green or brown) and/or eschar (tan, brown or black) in the wound bed. 5.5cm x 2 cm; eschar (Active)  12/19/22 0100  Location: Shoulder  Location Orientation: Left  Staging: Unstageable - Full thickness tissue loss in which the base of the injury is covered by slough (yellow, tan, gray, green or brown) and/or eschar (tan, brown or black) in the wound bed.  Wound Description (Comments): 5.5cm x 2 cm; eschar  Present on Admission:      Pressure Injury 12/19/22 Heel Left Unstageable - Full thickness tissue loss in which the base of the injury is covered by slough (yellow, tan, gray, green or brown) and/or eschar (tan, brown  or black) in the wound bed. 5.5 cm x 4 cm; eschar (Active)  12/19/22 0100  Location: Heel  Location Orientation: Left  Staging: Unstageable - Full thickness tissue loss in which the base of the injury is covered by slough (yellow, tan, gray, green or brown) and/or eschar (tan, brown or black) in the wound bed.  Wound Description (Comments): 5.5 cm x 4 cm; eschar  Present on Admission: Yes     Pressure Injury 12/19/22 Back Lateral;Left;Upper Unstageable - Full thickness tissue loss in which the base of the injury is covered by slough (yellow, tan, gray, green or brown) and/or eschar (tan, brown or black) in the wound bed. 5.5 cm x 2 cm; eschar (Active)  12/19/22 0100  Location: Back  Location Orientation: Lateral;Left;Upper  Staging: Unstageable - Full thickness tissue loss in which the base of the injury is covered by slough (yellow, tan, gray, green or brown) and/or eschar (tan, brown or black) in the wound bed.  Wound Description (Comments): 5.5 cm x 2 cm; eschar  Present on Admission: Yes     Pressure Injury 12/19/22 Tibial Posterior;Right Unstageable - Full thickness tissue loss in which  the base of the injury is covered by slough (yellow, tan, gray, green or brown) and/or eschar (tan, brown or black) in the wound bed. 11 cm x 5.5 cm w/ tunn (Active)  12/19/22 0100  Location: Tibial  Location Orientation: Posterior;Right  Staging: Unstageable - Full thickness tissue loss in which the base of the injury is covered by slough (yellow, tan, gray, green or brown) and/or eschar (tan, brown or black) in the wound bed.  Wound Description (Comments): 11 cm x 5.5 cm w/ tunneling  Present on Admission: Yes     Pressure Injury 12/19/22 Toe (Comment  which one) Left Unstageable - Full thickness tissue loss in which the base of the injury is covered by slough (yellow, tan, gray, green or brown) and/or eschar (tan, brown or black) in the wound bed. 2 cm x 2 cm; es (Active)  12/19/22 0100  Location: Toe (Comment  which one)  Location Orientation: Left  Staging: Unstageable - Full thickness tissue loss in which the base of the injury is covered by slough (yellow, tan, gray, green or brown) and/or eschar (tan, brown or black) in the wound bed.  Wound Description (Comments): 2 cm x 2 cm; eschar  Present on Admission:      Pressure Injury 12/21/22 Sacrum Mid Unstageable - Full thickness tissue loss in which the base of the injury is covered by slough (yellow, tan, gray, green or brown) and/or eschar (tan, brown or black) in the wound bed. *PT ONLY* unstageable, pressure in (Active)  12/21/22   Location: Sacrum  Location Orientation: Mid  Staging: Unstageable - Full thickness tissue loss in which the base of the injury is covered by slough (yellow, tan, gray, green or brown) and/or eschar (tan, brown or black) in the wound bed.  Wound Description (Comments): *PT ONLY* unstageable, pressure injury to sacrum, bone palpapable bse of wound  Present on Admission: Yes        Edwin [name] S Edwin [name]   [facility] If 7PM-7AM, please contact night-coverage at [email or web address], Office   [phone]   12/28/2022, 8:35 AM  LOS: 10 days

## 2022-12-28 NOTE — Progress Notes (Signed)
Daily Progress Note   Patient Name: Edwin [name]       Date: 12/28/2022 DOB: 1933/09/24  Age: 89 y.o. MRN#: [id number] Attending Physician: Meredeth Ide, MD Primary Care Physician: [name], DO Admit Date: 01/02/2023 Length of Stay: 10 days  Reason for Consultation/Follow-up: Establishing goals of care and symptom management  Subjective:   CC: Patient unresponsive when seen. No family at bedside.  Following up regarding complex medical decision making and symptom management.  Subjective:  Reviewed EMR prior to presenting to bedside. Patient continues on IV morphine infusion 3mg /hr.  Discussed care with bedside RN for updates.  Patient appears comfortable when seen. Continues to have long periods of apnea. No family present at bedside. Patient will have in hospital death while receiving comfort focused care.   Objective:   Vital Signs:  BP 110/68 (BP Location: Left Arm)   Pulse (!) 105   Temp (!) 97.5 F (36.4 C) (Oral)   Resp (!) 5   Ht 5\' 11"  (1.803 m)   Wt 64.3 kg   SpO2 90%   BMI 19.77 kg/m   Physical Exam: General: cachetic, frail, laying in bed, unresponsive Eyes: No drainage noted HENT: secretions heard Cardiovascular: RRR Respiratory: Long periods of apnea Extremities: Multiple wounds present with bandages in place Neuro: Unresponsive  I personally reviewed recent imaging.   Assessment & Plan:   Assessment: Patient is an 89 year old male with a past medical history of dementia, right hip fracture/ME arthroplasty 1223, bed ridden, and nonverbal at baseline who was admitted on 12/29/2022 for management of ongoing cough.  During hospitalization patient initially managed for septic shock due to polymicrobial bacteremia, acute pulmonary embolism, and metabolic encephalopathy.  During hospitalization after conversations with patient's family, patient transition to comfort focused care on 12/21/2022.  Palliative medicine team consulted to assist with complex  medical decision making and symptom management.  Recommendations/Plan: # Complex medical decision making/goals of care:  - Patient unable to participate in complex medical decision-making due to medical status.  -Patient was transition to full comfort focused care on 12/21/2022 after hospitalist discussed care with patient's wife.  Continuing comfort focused care only at this time to maintain symptom management and dignity at the end of life. Care plan has been discussed with wife.  (Wife does NOT want to transfer patient to inpatient hospice and could not support home hospice care needs).  Anticipate in-hospital death.  -  Code Status: DNR  # Symptom management  -Pain/Dyspnea, acute in the setting of end-of-life care -Continue IV morphine continuous  infusion at 2-5mg /hr titration with RN bolus from infusion at 2mg  q30 mins prn for breakthrough pain/dyspnea. Currently at 3mg /hr continuous infusion.                  -Anxiety/agitation, in the setting of end-of-life care                               -Continue IV diazepam to 2.5-5mg  q4hrs prn. Continue to adjust based on patient's symptom burden.                   -Secretions, in the setting of end-of-life care                               - Continue IV glycopyrrolate 0.2 mg scheduled every 6 hours.  # Psychosocial Support:  -Wife  #  Discharge Planning: Anticipated Hospital Death   Thank you for allowing the palliative care team to participate in the care [facility].  Alvester Morin, DO Palliative Care Provider PMT # [phone]  If patient remains symptomatic despite maximum doses, please call PMT at [phone] between 0700 and 1900. Outside of these hours, please call attending, as PMT does not have night coverage.  *Please note that this is a verbal dictation therefore any spelling or grammatical errors are due to the "[employer]" system interpretation.

## 2022-12-29 DIAGNOSIS — L89899 Pressure ulcer of other site, unspecified stage: Secondary | ICD-10-CM | POA: Diagnosis not present

## 2022-12-29 DIAGNOSIS — R6521 Severe sepsis with septic shock: Secondary | ICD-10-CM | POA: Diagnosis not present

## 2022-12-29 DIAGNOSIS — A419 Sepsis, unspecified organism: Secondary | ICD-10-CM | POA: Diagnosis not present

## 2022-12-29 NOTE — Progress Notes (Signed)
[facility]  PROGRESS NOTE  Edwin [name] ZOX:[id number] DOB: [birth year] DOA: 12/27/2022 PCP: [name], DO   Brief HPI:   89 year old man with history of dementia, right hip fracture/hemiarthroplasty 05/2022, bedridden and nonverbal at baseline. Family is able to assist him into a wheelchair intermittently and he has home health assistance. He has reportedly had ongoing cough for a few weeks. There was concern that he was worsening and EMS was called.   He was admitted for sepsis.  Found to have polymicrobial bacteremia, osteomyelitis, decubitus ulcers, possible endocarditis.  After discussion with wife, now planning for comfort measures with hospice.     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 Prior hip arthroplasty with chronic periprosthetic fracture. Sacral decubitus ulcer overlying the sacrum and coccyx, loss of cortical definition of posterior cortex of distal most sacral segment suggestive of osteomyelitis  -Cultures grew E. coli, Enterococcus faecalis, Morganella morganii -ID consulted, concern for endocarditis -TTE showed normal mitral valve, aortic valve sclerosis, tricuspid valve normal in structure, pulmonic valve normal in structure -Initially plan to treat empirically for endocarditis/osteomyelitis, TEE was deferred -General surgery saw patient, Edwin [name]/p bedside debridement of the sacral decubitus and right lower extremity wound -Patient is now comfort care only; anticipate hospital death  Acute pulmonary embolism -Initially on heparin GGT, stopped for comfort measures -Echocardiogram did not show RV strain -Lower extremity venous duplex  obtained was positive for bilateral lower extremity DVTs  Metabolic encephalopathy -Likely in setting of septic shock as above -Continue comfort measures  Goals of care -Patient'Edwin [name] wife discussed with Dr. Lowell Guitar, patient was made comfort care.  Plan was to go home with hospice, however patient'Edwin [name] wife expressed desire that patient can stay in the hospital for end-of-life care.  Palliative care following.  Started on morphine gtt. Anticipate hospital death  Medications     glycopyrrolate  0.2 mg Intravenous Q6H      Data Reviewed:   CBG:  No results for input(Edwin [name]): "GLUCAP" in the last 168 hours.   SpO2: (!) 78 %    Vitals:   12/26/22 0512 12/27/22 0556 12/28/22 0713 12/29/22 0530  BP: 132/61 99/60 110/68 (!) 82/43  Pulse: 86 98 (!) 105 95  Resp: 14 (!) 5 (!) 5 14  Temp: 98.1 F (36.7 C) 97.8 F (36.6 C) (!) 97.5 F (36.4 C) (!) 97.5 F (36.4 C)  TempSrc: Oral Oral Oral Oral  SpO2: (!) 82% (!) 86% 90% (!) 78%  Weight:      Height:          Data Reviewed:  Basic Metabolic Panel: No results for input(Edwin [name]): "NA", "K", "CL", "CO2", "GLUCOSE", "BUN", "CREATININE", "CALCIUM", "MG", "PHOS" in the last 168 hours.   CBC: No results for input(Edwin [name]): "WBC", "NEUTROABS", "HGB", "HCT", "MCV", "PLT" in the last 168 hours.   LFT No results for input(Edwin [name]): "AST", "ALT", "ALKPHOS", "BILITOT", "PROT", "ALBUMIN" in the last 168 hours.    Antibiotics: Anti-infectives (From admission, onward)    Start     Dose/Rate Route Frequency Ordered Stop   12/20/22 0000  vancomycin (VANCOREADY) IVPB 1250 mg/250 mL  Status:  Discontinued        1,250 mg 166.7 mL/hr over 90 Minutes Intravenous Every 24 hours 12/19/22 0034 12/19/22 0920  12/19/22 2200  metroNIDAZOLE (FLAGYL) IVPB 500 mg  Status:  Discontinued        500 mg 100 mL/hr over 60 Minutes Intravenous 2 times daily 12/19/22 1715 12/21/22 1331   12/19/22 2000  ampicillin (OMNIPEN) 2 g in sodium chloride 0.9 % 100 mL IVPB  Status:   Discontinued        2 g 300 mL/hr over 20 Minutes Intravenous Every 4 hours 12/19/22 1715 12/21/22 1331   12/19/22 1815  cefTRIAXone (ROCEPHIN) 2 g in sodium chloride 0.9 % 100 mL IVPB  Status:  Discontinued        2 g 200 mL/hr over 30 Minutes Intravenous 2 times daily 12/19/22 1715 12/21/22 1331   12/19/22 1030  piperacillin-tazobactam (ZOSYN) IVPB 3.375 g  Status:  Discontinued        3.375 g 12.5 mL/hr over 240 Minutes Intravenous Every 8 hours 12/19/22 1015 12/19/22 1715   12/19/22 0800  ceFEPIme (MAXIPIME) 2 g in sodium chloride 0.9 % 100 mL IVPB  Status:  Discontinued        2 g 200 mL/hr over 30 Minutes Intravenous Every 8 hours 12/19/22 0034 12/19/22 0920   12/28/2022 2230  ceFEPIme (MAXIPIME) 2 g in sodium chloride 0.9 % 100 mL IVPB        2 g 200 mL/hr over 30 Minutes Intravenous  Once 12/15/2022 2222 12/17/2022 2321   12/29/2022 2230  metroNIDAZOLE (FLAGYL) IVPB 500 mg        500 mg 100 mL/hr over 60 Minutes Intravenous  Once 01/06/2023 2222 12/19/22 0010   12/30/2022 2230  vancomycin (VANCOCIN) IVPB 1000 mg/200 mL premix        1,000 mg 200 mL/hr over 60 Minutes Intravenous  Once 12/21/2022 2222 12/19/22 0122        DVT prophylaxis: SCDs  Code Status: DNR  Family Communication:    CONSULTS    Subjective   Continues to be unresponsive  Objective    Physical Examination:  Appears comfortable Unresponsive  Status is: Inpatient:      Pressure Injury 12/19/22 Coccyx Bilateral Unstageable - Full thickness tissue loss in which the base of the injury is covered by slough (yellow, tan, gray, green or brown) and/or eschar (tan, brown or black) in the wound bed. 15cm x 16cm w/ tunneling (Active)  12/19/22 0100  Location: Coccyx  Location Orientation: Bilateral  Staging: Unstageable - Full thickness tissue loss in which the base of the injury is covered by slough (yellow, tan, gray, green or brown) and/or eschar (tan, brown or black) in the wound bed.  Wound Description  (Comments): 15cm x 16cm w/ tunneling  Present on Admission: Yes     Pressure Injury 12/19/22 Shoulder Left Unstageable - Full thickness tissue loss in which the base of the injury is covered by slough (yellow, tan, gray, green or brown) and/or eschar (tan, brown or black) in the wound bed. 5.5cm x 2 cm; eschar (Active)  12/19/22 0100  Location: Shoulder  Location Orientation: Left  Staging: Unstageable - Full thickness tissue loss in which the base of the injury is covered by slough (yellow, tan, gray, green or brown) and/or eschar (tan, brown or black) in the wound bed.  Wound Description (Comments): 5.5cm x 2 cm; eschar  Present on Admission:      Pressure Injury 12/19/22 Heel Left Unstageable - Full thickness tissue loss in which the base of the injury is covered by slough (yellow, tan, gray, green or brown) and/or eschar (tan, brown  or black) in the wound bed. 5.5 cm x 4 cm; eschar (Active)  12/19/22 0100  Location: Heel  Location Orientation: Left  Staging: Unstageable - Full thickness tissue loss in which the base of the injury is covered by slough (yellow, tan, gray, green or brown) and/or eschar (tan, brown or black) in the wound bed.  Wound Description (Comments): 5.5 cm x 4 cm; eschar  Present on Admission: Yes     Pressure Injury 12/19/22 Back Lateral;Left;Upper Unstageable - Full thickness tissue loss in which the base of the injury is covered by slough (yellow, tan, gray, green or brown) and/or eschar (tan, brown or black) in the wound bed. 5.5 cm x 2 cm; eschar (Active)  12/19/22 0100  Location: Back  Location Orientation: Lateral;Left;Upper  Staging: Unstageable - Full thickness tissue loss in which the base of the injury is covered by slough (yellow, tan, gray, green or brown) and/or eschar (tan, brown or black) in the wound bed.  Wound Description (Comments): 5.5 cm x 2 cm; eschar  Present on Admission: Yes     Pressure Injury 12/19/22 Tibial Posterior;Right Unstageable -  Full thickness tissue loss in which the base of the injury is covered by slough (yellow, tan, gray, green or brown) and/or eschar (tan, brown or black) in the wound bed. 11 cm x 5.5 cm w/ tunn (Active)  12/19/22 0100  Location: Tibial  Location Orientation: Posterior;Right  Staging: Unstageable - Full thickness tissue loss in which the base of the injury is covered by slough (yellow, tan, gray, green or brown) and/or eschar (tan, brown or black) in the wound bed.  Wound Description (Comments): 11 cm x 5.5 cm w/ tunneling  Present on Admission: Yes     Pressure Injury 12/19/22 Toe (Comment  which one) Left Unstageable - Full thickness tissue loss in which the base of the injury is covered by slough (yellow, tan, gray, green or brown) and/or eschar (tan, brown or black) in the wound bed. 2 cm x 2 cm; es (Active)  12/19/22 0100  Location: Toe (Comment  which one)  Location Orientation: Left  Staging: Unstageable - Full thickness tissue loss in which the base of the injury is covered by slough (yellow, tan, gray, green or brown) and/or eschar (tan, brown or black) in the wound bed.  Wound Description (Comments): 2 cm x 2 cm; eschar  Present on Admission:      Pressure Injury 12/21/22 Sacrum Mid Unstageable - Full thickness tissue loss in which the base of the injury is covered by slough (yellow, tan, gray, green or brown) and/or eschar (tan, brown or black) in the wound bed. *PT ONLY* unstageable, pressure in (Active)  12/21/22   Location: Sacrum  Location Orientation: Mid  Staging: Unstageable - Full thickness tissue loss in which the base of the injury is covered by slough (yellow, tan, gray, green or brown) and/or eschar (tan, brown or black) in the wound bed.  Wound Description (Comments): *PT ONLY* unstageable, pressure injury to sacrum, bone palpapable bse of wound  Present on Admission: Yes        Edwin [name] Edwin [name] Edwin [name]   [facility] If 7PM-7AM, please contact night-coverage at  [email or web address], Office  [phone]   12/29/2022, 8:52 AM  LOS: 11 days

## 2022-12-30 DIAGNOSIS — A419 Sepsis, unspecified organism: Secondary | ICD-10-CM | POA: Diagnosis not present

## 2022-12-30 DIAGNOSIS — R6521 Severe sepsis with septic shock: Secondary | ICD-10-CM | POA: Diagnosis not present

## 2022-12-30 NOTE — Progress Notes (Signed)
[facility]  PROGRESS NOTE  Edwin [name] ZOX:[id number] DOB: [birth year] DOA: 12/19/2022 PCP: [name], DO   Brief HPI:   89 year old man with history of dementia, right hip fracture/hemiarthroplasty 05/2022, bedridden and nonverbal at baseline. Family is able to assist him into a wheelchair intermittently and he has home health assistance. He has reportedly had ongoing cough for a few weeks. There was concern that he was worsening and EMS was called.   He was admitted for sepsis.  Found to have polymicrobial bacteremia, osteomyelitis, decubitus ulcers, possible endocarditis.  After discussion with wife, now planning for comfort measures with hospice.     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 Prior hip arthroplasty with chronic periprosthetic fracture. Sacral decubitus ulcer overlying the sacrum and coccyx, loss of cortical definition of posterior cortex of distal most sacral segment suggestive of osteomyelitis  -Cultures grew E. coli, Enterococcus faecalis, Morganella morganii -ID consulted, concern for endocarditis -TTE showed normal mitral valve, aortic valve sclerosis, tricuspid valve normal in structure, pulmonic valve normal in structure -Initially plan to treat empirically for endocarditis/osteomyelitis, TEE was deferred -General surgery saw patient, s/p bedside debridement of the sacral decubitus and right lower extremity wound -Patient is now comfort care only; anticipate hospital death  Acute pulmonary embolism -Initially on heparin GGT, stopped for comfort measures -Echocardiogram did not show RV strain -Lower extremity venous duplex  obtained was positive for bilateral lower extremity DVTs  Metabolic encephalopathy -Likely in setting of septic shock as above -Continue comfort measures  Goals of care -Patient's wife discussed with Dr. Lowell Guitar, patient was made comfort care.  Plan was to go home with hospice, however patient's wife expressed desire that patient can stay in the hospital for end-of-life care.  Palliative care following.  Started on morphine gtt. Anticipate hospital death  Medications     glycopyrrolate  0.2 mg Intravenous Q6H      Data Reviewed:   CBG:  No results for input(s): "GLUCAP" in the last 168 hours.   SpO2: 92 %    Vitals:   12/27/22 0556 12/28/22 0713 12/29/22 0530 12/30/22 0650  BP: 99/60 110/68 (!) 82/43 (!) 79/44  Pulse: 98 (!) 105 95 96  Resp: (!) 5 (!) 5 14 (!) 4  Temp: 97.8 F (36.6 C) (!) 97.5 F (36.4 C) (!) 97.5 F (36.4 C) 99.2 F (37.3 C)  TempSrc: Oral Oral Oral Oral  SpO2: (!) 86% 90% (!) 78% 92%  Weight:      Height:          Data Reviewed:  Basic Metabolic Panel: No results for input(s): "NA", "K", "CL", "CO2", "GLUCOSE", "BUN", "CREATININE", "CALCIUM", "MG", "PHOS" in the last 168 hours.   CBC: No results for input(s): "WBC", "NEUTROABS", "HGB", "HCT", "MCV", "PLT" in the last 168 hours.   LFT No results for input(s): "AST", "ALT", "ALKPHOS", "BILITOT", "PROT", "ALBUMIN" in the last 168 hours.    Antibiotics: Anti-infectives (From admission, onward)    Start     Dose/Rate Route Frequency Ordered Stop   12/20/22 0000  vancomycin (VANCOREADY) IVPB 1250 mg/250 mL  Status:  Discontinued        1,250 mg 166.7 mL/hr over 90 Minutes Intravenous Every 24 hours 12/19/22 0034 12/19/22 0920  12/19/22 2200  metroNIDAZOLE (FLAGYL) IVPB 500 mg  Status:  Discontinued        500 mg 100 mL/hr over 60 Minutes Intravenous 2 times daily 12/19/22 1715 12/21/22 1331   12/19/22 2000  ampicillin (OMNIPEN) 2 g in sodium chloride 0.9 % 100 mL IVPB  Status:   Discontinued        2 g 300 mL/hr over 20 Minutes Intravenous Every 4 hours 12/19/22 1715 12/21/22 1331   12/19/22 1815  cefTRIAXone (ROCEPHIN) 2 g in sodium chloride 0.9 % 100 mL IVPB  Status:  Discontinued        2 g 200 mL/hr over 30 Minutes Intravenous 2 times daily 12/19/22 1715 12/21/22 1331   12/19/22 1030  piperacillin-tazobactam (ZOSYN) IVPB 3.375 g  Status:  Discontinued        3.375 g 12.5 mL/hr over 240 Minutes Intravenous Every 8 hours 12/19/22 1015 12/19/22 1715   12/19/22 0800  ceFEPIme (MAXIPIME) 2 g in sodium chloride 0.9 % 100 mL IVPB  Status:  Discontinued        2 g 200 mL/hr over 30 Minutes Intravenous Every 8 hours 12/19/22 0034 12/19/22 0920   01/04/2023 2230  ceFEPIme (MAXIPIME) 2 g in sodium chloride 0.9 % 100 mL IVPB        2 g 200 mL/hr over 30 Minutes Intravenous  Once 12/17/2022 2222 12/20/2022 2321   12/30/2022 2230  metroNIDAZOLE (FLAGYL) IVPB 500 mg        500 mg 100 mL/hr over 60 Minutes Intravenous  Once 01/02/2023 2222 12/19/22 0010   12/11/2022 2230  vancomycin (VANCOCIN) IVPB 1000 mg/200 mL premix        1,000 mg 200 mL/hr over 60 Minutes Intravenous  Once 12/16/2022 2222 12/19/22 0122        DVT prophylaxis: SCDs  Code Status: DNR  Family Communication:    CONSULTS    Subjective   Seen and examined.  Continues to be unresponsive.  Exam Haning  Objective    Physical Examination:  General exam: Appears comfortable but unresponsive. Respiratory system: Clear to auscultation. Respiratory effort normal. Cardiovascular system: S1 & S2 heard, RRR. No JVD, murmurs, rubs, gallops or clicks. No pedal edema. Gastrointestinal system: Abdomen is nondistended, soft and nontender. No organomegaly or masses felt. Normal bowel sounds heard.   Status is: Inpatient:      Pressure Injury 12/19/22 Coccyx Bilateral Unstageable - Full thickness tissue loss in which the base of the injury is covered by slough (yellow, tan, gray, green or brown) and/or eschar  (tan, brown or black) in the wound bed. 15cm x 16cm w/ tunneling (Active)  12/19/22 0100  Location: Coccyx  Location Orientation: Bilateral  Staging: Unstageable - Full thickness tissue loss in which the base of the injury is covered by slough (yellow, tan, gray, green or brown) and/or eschar (tan, brown or black) in the wound bed.  Wound Description (Comments): 15cm x 16cm w/ tunneling  Present on Admission: Yes     Pressure Injury 12/19/22 Shoulder Left Unstageable - Full thickness tissue loss in which the base of the injury is covered by slough (yellow, tan, gray, green or brown) and/or eschar (tan, brown or black) in the wound bed. 5.5cm x 2 cm; eschar (Active)  12/19/22 0100  Location: Shoulder  Location Orientation: Left  Staging: Unstageable - Full thickness tissue loss in which the base of the injury is covered by slough (yellow, tan, gray, green or brown) and/or eschar (tan, brown or black) in  the wound bed.  Wound Description (Comments): 5.5cm x 2 cm; eschar  Present on Admission:      Pressure Injury 12/19/22 Heel Left Unstageable - Full thickness tissue loss in which the base of the injury is covered by slough (yellow, tan, gray, green or brown) and/or eschar (tan, brown or black) in the wound bed. 5.5 cm x 4 cm; eschar (Active)  12/19/22 0100  Location: Heel  Location Orientation: Left  Staging: Unstageable - Full thickness tissue loss in which the base of the injury is covered by slough (yellow, tan, gray, green or brown) and/or eschar (tan, brown or black) in the wound bed.  Wound Description (Comments): 5.5 cm x 4 cm; eschar  Present on Admission: Yes     Pressure Injury 12/19/22 Back Lateral;Left;Upper Unstageable - Full thickness tissue loss in which the base of the injury is covered by slough (yellow, tan, gray, green or brown) and/or eschar (tan, brown or black) in the wound bed. 5.5 cm x 2 cm; eschar (Active)  12/19/22 0100  Location: Back  Location Orientation:  Lateral;Left;Upper  Staging: Unstageable - Full thickness tissue loss in which the base of the injury is covered by slough (yellow, tan, gray, green or brown) and/or eschar (tan, brown or black) in the wound bed.  Wound Description (Comments): 5.5 cm x 2 cm; eschar  Present on Admission: Yes     Pressure Injury 12/19/22 Tibial Posterior;Right Unstageable - Full thickness tissue loss in which the base of the injury is covered by slough (yellow, tan, gray, green or brown) and/or eschar (tan, brown or black) in the wound bed. 11 cm x 5.5 cm w/ tunn (Active)  12/19/22 0100  Location: Tibial  Location Orientation: Posterior;Right  Staging: Unstageable - Full thickness tissue loss in which the base of the injury is covered by slough (yellow, tan, gray, green or brown) and/or eschar (tan, brown or black) in the wound bed.  Wound Description (Comments): 11 cm x 5.5 cm w/ tunneling  Present on Admission: Yes     Pressure Injury 12/19/22 Toe (Comment  which one) Left Unstageable - Full thickness tissue loss in which the base of the injury is covered by slough (yellow, tan, gray, green or brown) and/or eschar (tan, brown or black) in the wound bed. 2 cm x 2 cm; es (Active)  12/19/22 0100  Location: Toe (Comment  which one)  Location Orientation: Left  Staging: Unstageable - Full thickness tissue loss in which the base of the injury is covered by slough (yellow, tan, gray, green or brown) and/or eschar (tan, brown or black) in the wound bed.  Wound Description (Comments): 2 cm x 2 cm; eschar  Present on Admission:      Pressure Injury 12/21/22 Sacrum Mid Unstageable - Full thickness tissue loss in which the base of the injury is covered by slough (yellow, tan, gray, green or brown) and/or eschar (tan, brown or black) in the wound bed. *PT ONLY* unstageable, pressure in (Active)  12/21/22   Location: Sacrum  Location Orientation: Mid  Staging: Unstageable - Full thickness tissue loss in which the base of  the injury is covered by slough (yellow, tan, gray, green or brown) and/or eschar (tan, brown or black) in the wound bed.  Wound Description (Comments): *PT ONLY* unstageable, pressure injury to sacrum, bone palpapable bse of wound  Present on Admission: Yes        Edwin [name]   [facility] If 7PM-7AM, please contact night-coverage at [email or web address], Office  [phone]   12/30/2022, 11:12 AM  LOS: 12 days

## 2022-12-30 NOTE — Progress Notes (Signed)
Daily Progress Note   Patient Name: Edwin [name]       Date: 12/30/2022 DOB: 1933/07/13  Age: 89 y.o. MRN#: [id number] Attending Physician: Hughie Closs, MD Primary Care Physician: [name], DO Admit Date: 12/12/2022 Length of Stay: 12 days  Reason for Consultation/Follow-up: Establishing goals of care and symptom management  Subjective:   CC: Patient unresponsive when seen. No family at bedside.  Following up regarding complex medical decision making and symptom management.  Subjective:  Reviewed EMR prior to presenting to bedside. Patient remains on IV morphine infusion 3mg /hr.    Patient appears comfortable when seen. Continues to have long periods of apnea. No family present at bedside. Patient will have in hospital death while receiving comfort focused care.   Objective:   Vital Signs:  BP (!) 79/44 (BP Location: Right Arm)   Pulse 96   Temp 99.2 F (37.3 C) (Oral)   Resp (!) 4   Ht 5\' 11"  (1.803 m)   Wt 64.3 kg   SpO2 92%   BMI 19.77 kg/m   Physical Exam: General: cachetic, frail, laying in bed, unresponsive Eyes: No drainage noted HENT: secretions heard Cardiovascular: RRR Respiratory: Long periods of apnea, shallow breathing.  Extremities: Multiple wounds present with bandages in place Neuro: Unresponsive  I personally reviewed recent imaging.   Assessment & Plan:   Assessment: Patient is an 89 year old male with a past medical history of dementia, right hip fracture/ME arthroplasty 1223, bed ridden, and nonverbal at baseline who was admitted on 12/27/2022 for management of ongoing cough.  During hospitalization patient initially managed for septic shock due to polymicrobial bacteremia, acute pulmonary embolism, and metabolic encephalopathy.  During hospitalization after conversations with patient's family, patient transition to comfort focused care on 12/21/2022.  Palliative medicine team consulted to assist with complex medical decision making and  symptom management.  Recommendations/Plan: # Complex medical decision making/goals of care:  - Patient unable to participate in complex medical decision-making due to medical status.  -Patient was transition to full comfort focused care on 12/21/2022 after hospitalist discussed care with patient's wife.  Continuing comfort focused care only at this time to maintain symptom management and dignity at the end of life. Care plan has been discussed with wife.  (Wife does NOT want to transfer patient to inpatient hospice and could not support home hospice care needs).  Anticipate in-hospital death.  -  Code Status: DNR  # Symptom management  -Pain/Dyspnea, acute in the setting of end-of-life care -Continue IV morphine continuous  infusion at 2-5mg /hr titration with RN bolus from infusion at 2mg  q30 mins prn for breakthrough pain/dyspnea. Currently at 3mg /hr continuous infusion.                  -Anxiety/agitation, in the setting of end-of-life care                               -Continue IV diazepam to 2.5-5mg  q4hrs prn. Continue to adjust based on patient's symptom burden.                   -Secretions, in the setting of end-of-life care                               - Continue IV glycopyrrolate 0.2 mg scheduled every 6 hours.  # Psychosocial Support:  -Wife  # Discharge Planning: Anticipated Hospital  Death   Thank you for allowing the palliative care team to participate in the care [facility]. Low MDM Rosalin Hawking MD Palliative Care Provider PMT # [phone]  If patient remains symptomatic despite maximum doses, please call PMT at [phone] between 0700 and 1900. Outside of these hours, please call attending, as PMT does not have night coverage.  *Please note that this is a verbal dictation therefore any spelling or grammatical errors are due to the "[employer] One" system interpretation.

## 2022-12-31 DIAGNOSIS — A419 Sepsis, unspecified organism: Secondary | ICD-10-CM | POA: Diagnosis not present

## 2022-12-31 DIAGNOSIS — I2699 Other pulmonary embolism without acute cor pulmonale: Secondary | ICD-10-CM | POA: Diagnosis not present

## 2022-12-31 DIAGNOSIS — L89899 Pressure ulcer of other site, unspecified stage: Secondary | ICD-10-CM | POA: Diagnosis not present

## 2022-12-31 DIAGNOSIS — R6521 Severe sepsis with septic shock: Secondary | ICD-10-CM | POA: Diagnosis not present

## 2022-12-31 DIAGNOSIS — J189 Pneumonia, unspecified organism: Secondary | ICD-10-CM | POA: Diagnosis not present

## 2022-12-31 NOTE — Hospital Course (Signed)
89 year old man with history of dementia, right hip fracture/hemiarthroplasty 05/2022, bedridden and nonverbal at baseline. Family is able to assist him into a wheelchair intermittently and he has home health assistance. He has reportedly had ongoing cough for a few weeks. There was concern that he was worsening and EMS was called. He was admitted for sepsis. Found to have polymicrobial bacteremia, osteomyelitis, decubitus ulcers, possible endocarditis. After discussion with wife, now planning for comfort measures with hospice.

## 2022-12-31 NOTE — Progress Notes (Signed)
Daily Progress Note   Patient Name: Edwin [name]       Date: 12/31/2022 DOB: [birth year]  Age: 89 y.o. MRN#: [id number] Attending Physician: Jerald Kief, MD Primary Care Physician: [name], DO Admit Date: 01/04/2023 Length of Stay: 13 days  Reason for Consultation/Follow-up: Establishing goals of care and symptom management  Subjective:   CC: Patient unresponsive when seen. No family at bedside.  Following up regarding complex medical decision making and symptom management.  Subjective:  Reviewed EMR prior to presenting to bedside. Patient remains on IV morphine infusion  4 mg/hr.    Patient appears comfortable when seen. Continues to have long periods of apnea. No family present at bedside. Patient will have in hospital death while receiving comfort focused care.   Objective:   Vital Signs:  BP (!) 68/43 (BP Location: Right Arm)   Pulse (!) 102   Temp 98.3 F (36.8 C) (Oral)   Resp (!) 9   Ht 5\' 11"  (1.803 m)   Wt 64.3 kg   SpO2 (!) 63%   BMI 19.77 kg/m   Physical Exam: General: cachetic, frail, laying in bed, unresponsive Eyes: No drainage noted HENT: secretions heard Cardiovascular: RRR Respiratory: Long periods of apnea, shallow breathing.  Extremities: Multiple wounds present with bandages in place Neuro: Unresponsive  I personally reviewed recent imaging.   Assessment & Plan:   Assessment: Patient is an 89 year old male with a past medical history of dementia, right hip fracture/ME arthroplasty 1223, bed ridden, and nonverbal at baseline who was admitted on 12/30/2022 for management of ongoing cough.  During hospitalization patient initially managed for septic shock due to polymicrobial bacteremia, acute pulmonary embolism, and metabolic encephalopathy.  During hospitalization after conversations with patient's family, patient transition to comfort focused care on 12/21/2022.  Palliative medicine team consulted to assist with complex medical decision  making and symptom management.  Recommendations/Plan: # Complex medical decision making/goals of care:  - Patient unable to participate in complex medical decision-making due to medical status.  -Patient was transition to full comfort focused care on 12/21/2022 after hospitalist discussed care with patient's wife.  Continuing comfort focused care only at this time to maintain symptom management and dignity at the end of life. Care plan has been discussed with wife.  (Wife does NOT want to transfer patient to inpatient hospice and could not support home hospice care needs).  Anticipate in-hospital death.  -  Code Status: DNR  # Symptom management  -Pain/Dyspnea, acute in the setting of end-of-life care -Continue IV morphine continuous  infusion at 2-5mg /hr titration with RN bolus from infusion at 2mg  q30 mins prn for breakthrough pain/dyspnea. Currently at  4 mg/hr continuous infusion.                  -Anxiety/agitation, in the setting of end-of-life care                               -Continue IV diazepam to 2.5-5mg  q4hrs prn. Continue to adjust based on patient's symptom burden.                   -Secretions, in the setting of end-of-life care                               - Continue IV glycopyrrolate 0.2 mg scheduled every 6 hours.  # Psychosocial Support:  -  Wife  # Discharge Planning: Anticipated Hospital Death   Thank you for allowing the palliative care team to participate in the care [facility] Low MDM Rosalin Hawking MD Palliative Care Provider PMT # [phone]  If patient remains symptomatic despite maximum doses, please call PMT at [phone] between 0700 and 1900. Outside of these hours, please call attending, as PMT does not have night coverage.  *Please note that this is a verbal dictation therefore any spelling or grammatical errors are due to the "[employer]agon [employer] One" system interpretation.

## 2022-12-31 NOTE — Progress Notes (Signed)
  Progress Note   Patient: Edwin [name] HYQ:[id number] DOB: [birth year] DOA: 01/06/2023     13 DOS: the patient was seen and examined on 12/31/2022   Brief hospital course: 89 year old man with history of dementia, right hip fracture/hemiarthroplasty 05/2022, bedridden and nonverbal at baseline. Family is able to assist him into a wheelchair intermittently and he has home health assistance. He has reportedly had ongoing cough for a few weeks. There was concern that he was worsening and EMS was called. He was admitted for sepsis. Found to have polymicrobial bacteremia, osteomyelitis, decubitus ulcers, possible endocarditis. After discussion with wife, now planning for comfort measures with hospice.   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 Prior hip arthroplasty with chronic periprosthetic fracture. Sacral decubitus ulcer overlying the sacrum and coccyx, loss of cortical definition of posterior cortex of distal most sacral segment suggestive of osteomyelitis  -Cultures grew E. coli, Enterococcus faecalis, Morganella morganii -ID consulted, concern for endocarditis -TTE showed normal mitral valve, aortic valve sclerosis, tricuspid valve normal in structure, pulmonic valve normal in structure -Initially plan to treat empirically for endocarditis/osteomyelitis, TEE was deferred -General surgery saw patient, s/p bedside debridement of the sacral decubitus and right lower extremity wound -Patient is now comfort care only; anticipating hospital death   Acute pulmonary embolism -Initially on heparin GGT, stopped for comfort measures -Echocardiogram did not show RV  strain -Lower extremity venous duplex obtained was positive for bilateral lower extremity DVTs   Metabolic encephalopathy -Likely in setting of septic shock as above -Continue comfort measures   Goals of care -Patient's wife discussed with Dr. Lowell Guitar, patient was made comfort care.  Plan was to go home with hospice, however patient's wife expressed desire that patient can stay in the hospital for end-of-life care.  Palliative care following.  Started on morphine gtt. Anticipate hospital death      Subjective: Unable to assess. Appears comforable  Physical Exam: Vitals:   12/28/22 0713 12/29/22 0530 12/30/22 0650 12/31/22 0635  BP: 110/68 (!) 82/43 (!) 79/44 (!) 68/43  Pulse: (!) 105 95 96 (!) 102  Resp: (!) 5 14 (!) 4 (!) 9  Temp: (!) 97.5 F (36.4 C) (!) 97.5 F (36.4 C) 99.2 F (37.3 C) 98.3 F (36.8 C)  TempSrc: Oral Oral Oral Oral  SpO2: 90% (!) 78% 92% (!) 63%  Weight:      Height:       General exam: Asleep, laying in bed, in nad Respiratory system: Normal respiratory effort, no audible wheezing Cardiovascular system: perfused, no notable JVD Gastrointestinal system: soft, nondistended Central nervous system: no seizures, no tremors Extremities: No notable joint deformities, no clubbing Skin: no rashes, no notable skin lesions seen Psychiatry: unable to assess given mentation  Data Reviewed:  Labs reviewed: Na 139, K 3.8, Cr 0.60, WBC 16.6, Hgb 8.8, Plts 171  Family Communication: Pt in room, family not at bedside  Disposition: Status is: Inpatient Remains inpatient appropriate because: severity of illness  Planned Discharge Destination:  Anticipate hospital death     Author: Rickey Barbara, MD 12/31/2022 7:33 PM  For on call review [email or web address].

## 2023-01-01 DIAGNOSIS — A419 Sepsis, unspecified organism: Secondary | ICD-10-CM | POA: Diagnosis not present

## 2023-01-01 DIAGNOSIS — I2699 Other pulmonary embolism without acute cor pulmonale: Secondary | ICD-10-CM | POA: Diagnosis not present

## 2023-01-01 DIAGNOSIS — J189 Pneumonia, unspecified organism: Secondary | ICD-10-CM | POA: Diagnosis not present

## 2023-01-01 DIAGNOSIS — L89899 Pressure ulcer of other site, unspecified stage: Secondary | ICD-10-CM | POA: Diagnosis not present

## 2023-01-01 DIAGNOSIS — R6521 Severe sepsis with septic shock: Secondary | ICD-10-CM | POA: Diagnosis not present

## 2023-01-01 NOTE — Progress Notes (Signed)
Daily Progress Note   Patient Name: Edwin [name]       Date: 01/01/2023 DOB: 1934-03-23  Age: 89 y.o. MRN#: [id number] Attending Physician: Jerald Kief, MD Primary Care Physician: [name], DO Admit Date: 12/26/2022 Length of Stay: 14 days  Reason for Consultation/Follow-up: Establishing goals of care and symptom management  Subjective:   CC: Patient unresponsive when seen. No family at bedside.  Following up regarding complex medical decision making and symptom management.  Subjective:  Reviewed EMR prior to presenting to bedside. Patient remains on IV morphine infusion  4 mg/hr.    Patient appears comfortable when seen. Continues to have long periods of apnea. No family present at bedside. Patient will have in hospital death while receiving comfort focused care.  Patient appears to have extremely diminished urine output-dark concentrated urine, suspect patient becoming anuric, additionally today noted to have respirations with mandibular movement, suspect patient is hours to some very limited number of days for life expectancy 1-2 days, in my opinion.  Objective:   Vital Signs:  BP (!) 72/46 (BP Location: Right Arm)   Pulse 94   Temp 97.6 F (36.4 C) (Oral)   Resp (!) 8   Ht 5\' 11"  (1.803 m)   Wt 64.3 kg   SpO2 (!) 77%   BMI 19.77 kg/m   Physical Exam: General: cachetic, frail, laying in bed, unresponsive Eyes: No drainage noted HENT: secretions heard Cardiovascular: RRR Respiratory: Long periods of apnea, shallow breathing, resp with mandibular movement.  Extremities: Multiple wounds present with bandages in place Neuro: Unresponsive  I personally reviewed recent imaging.   Assessment & Plan:   Assessment: Patient is an 89 year old male with a past medical history of dementia, right hip fracture/ME arthroplasty 1223, bed ridden, and nonverbal at baseline who was admitted on 12/08/2022 for management of ongoing cough.  During hospitalization patient  initially managed for septic shock due to polymicrobial bacteremia, acute pulmonary embolism, and metabolic encephalopathy.  During hospitalization after conversations with patient's family, patient transition to comfort focused care on 12/21/2022.  Palliative medicine team consulted to assist with complex medical decision making and symptom management.  Recommendations/Plan: # Complex medical decision making/goals of care:  - Patient unable to participate in complex medical decision-making due to medical status.  -Patient was transition to full comfort focused care on 12/21/2022 after hospitalist discussed care with patient's wife.  Continuing comfort focused care only at this time to maintain symptom management and dignity at the end of life. Care plan has been discussed with wife.  (Wife does NOT want to transfer patient to inpatient hospice and could not support home hospice care needs).  Anticipate in-hospital death.  -  Code Status: DNR  # Symptom management  -Pain/Dyspnea, acute in the setting of end-of-life care -Continue IV morphine continuous  infusion at 2-5mg /hr titration with RN bolus from infusion at 2mg  q30 mins prn for breakthrough pain/dyspnea. Currently at  4 mg/hr continuous infusion.                  -Anxiety/agitation, in the setting of end-of-life care                               -Continue IV diazepam to 2.5-5mg  q4hrs prn. Continue to adjust based on patient's symptom burden.                   -Secretions, in the setting of end-of-life care                               -  Continue IV glycopyrrolate 0.2 mg scheduled every 6 hours.  # Psychosocial Support:  -Wife  # Discharge Planning: Anticipated Hospital Death  Less Than 2 Days Before Death In the final hours, patients exhibit specific clinical signs that indicate the approach of death.  Clinical Signs:  1.Death rattle  Management Techniques: Educate the family about the nature of the sound Reposition the patient  to promote drainage Consider using anticholinergic medications if the patient is suffering.  2.Apnea. Management Techniques:  Educate the family about the possibility of irregular breathing patterns Administer bolus opioids if dyspnea is present.  3.Respiration with mandibular movement  Management Techniques: Educate the family about changes in breathing patterns.  4.Decreased urine output  Management Techniques: Educate the family about the natural decrease in bodily functions.  5.Pulselessness of radial artery  Management Techniques: Educate the family about changes in circulation and perfusion.  6.Inability to close eyelids  Management Techniques: Educate the family about the physical changes in muscle control Use a wet washcloth if eyes are dry or irritated.  7.Grunting of vocal cords  Management Techniques: Educate the family the family about the possibility of vocal cord tension Administer additional opioids if non verbal gestures of pain is present.  8.Fever  Management Techniques: Place a cool washcloth on the patient's forehead Remove excess blankets Use a fan Administer acetominophin if necessary.   Thank you for allowing the palliative care team to participate in the care [facility]. Low MDM Rosalin Hawking MD Palliative Care Provider PMT # [phone]  If patient remains symptomatic despite maximum doses, please call PMT at [phone] between 0700 and 1900. Outside of these hours, please call attending, as PMT does not have night coverage.  *Please note that this is a verbal dictation therefore any spelling or grammatical errors are due to the "Dragon [employer] One" system interpretation.

## 2023-01-01 NOTE — Progress Notes (Signed)
  Progress Note   Patient: Edwin [name] ZOX:[id number] DOB: 1933-11-03 DOA: 12/20/2022     14 DOS: the patient was seen and examined on 01/01/2023   Brief hospital course: 89 year old man with history of dementia, right hip fracture/hemiarthroplasty 05/2022, bedridden and nonverbal at baseline. Family is able to assist him into a wheelchair intermittently and he has home health assistance. He has reportedly had ongoing cough for a few weeks. There was concern that he was worsening and EMS was called. He was admitted for sepsis. Found to have polymicrobial bacteremia, osteomyelitis, decubitus ulcers, possible endocarditis. After discussion with wife, now planning for comfort measures with hospice.   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 Prior hip arthroplasty with chronic periprosthetic fracture. Sacral decubitus ulcer overlying the sacrum and coccyx, loss of cortical definition of posterior cortex of distal most sacral segment suggestive of osteomyelitis  -Cultures grew E. coli, Enterococcus faecalis, Morganella morganii -ID consulted, concern for endocarditis -TTE showed normal mitral valve, aortic valve sclerosis, tricuspid valve normal in structure, pulmonic valve normal in structure -Initially plan to treat empirically for endocarditis/osteomyelitis, TEE was deferred -General surgery saw patient, s/p bedside debridement of the sacral decubitus and right lower extremity wound -Patient is now comfort care only; anticipating hospital death   Acute pulmonary embolism -Initially on heparin GGT, stopped for comfort measures -Echocardiogram did not show RV  strain -Lower extremity venous duplex obtained was positive for bilateral lower extremity DVTs   Metabolic encephalopathy -Likely in setting of septic shock as above -Continue comfort measures   Goals of care -Patient's wife discussed with Dr. Lowell Guitar, patient was made comfort care.  Plan was to go home with hospice, however patient's wife expressed desire that patient can stay in the hospital for end-of-life care.  Palliative care following.  Started on morphine gtt. Anticipating hospital death      Subjective: Cannot assess given mentation  Physical Exam: Vitals:   12/29/22 0530 12/30/22 0650 12/31/22 0635 01/01/23 0659  BP: (!) 82/43 (!) 79/44 (!) 68/43 (!) 72/46  Pulse: 95 96 (!) 102 94  Resp: 14 (!) 4 (!) 9 (!) 8  Temp: (!) 97.5 F (36.4 C) 99.2 F (37.3 C) 98.3 F (36.8 C) 97.6 F (36.4 C)  TempSrc: Oral Oral Oral Oral  SpO2: (!) 78% 92% (!) 63% (!) 77%  Weight:      Height:       General exam: Not conversant, in no acute distress Respiratory system: normal chest rise, clear, no audible wheezing  Data Reviewed:  There are no new results to review at this time.  Family Communication: Pt in room, family not at bedside  Disposition: Status is: Inpatient Remains inpatient appropriate because: severity of illness  Planned Discharge Destination:  Anticipate hospital death     Author: Rickey Barbara, MD 01/01/2023 4:12 PM  For on call review [email or web address].

## 2023-01-02 DIAGNOSIS — Z515 Encounter for palliative care: Secondary | ICD-10-CM | POA: Diagnosis not present

## 2023-01-02 DIAGNOSIS — J189 Pneumonia, unspecified organism: Secondary | ICD-10-CM | POA: Diagnosis not present

## 2023-01-02 DIAGNOSIS — L89899 Pressure ulcer of other site, unspecified stage: Secondary | ICD-10-CM | POA: Diagnosis not present

## 2023-01-02 DIAGNOSIS — A419 Sepsis, unspecified organism: Secondary | ICD-10-CM | POA: Diagnosis not present

## 2023-01-02 DIAGNOSIS — I2699 Other pulmonary embolism without acute cor pulmonale: Secondary | ICD-10-CM | POA: Diagnosis not present

## 2023-01-02 NOTE — Progress Notes (Signed)
  Progress Note   Patient: Edwin [name] ZOX:[id number] DOB: [birth year] DOA: 12/12/2022     15 DOS: the patient was seen and examined on 01/02/2023   Brief hospital course: 89 year old man with history of dementia, right hip fracture/hemiarthroplasty 05/2022, bedridden and nonverbal at baseline. Family is able to assist him into a wheelchair intermittently and he has home health assistance. He has reportedly had ongoing cough for a few weeks. There was concern that he was worsening and EMS was called. He was admitted for sepsis. Found to have polymicrobial bacteremia, osteomyelitis, decubitus ulcers, possible endocarditis. After discussion with wife, now planning for comfort measures with hospice.   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 Prior hip arthroplasty with chronic periprosthetic fracture. Sacral decubitus ulcer overlying the sacrum and coccyx, loss of cortical definition of posterior cortex of distal most sacral segment suggestive of osteomyelitis  -Cultures grew E. coli, Enterococcus faecalis, Morganella morganii -ID consulted, concern for endocarditis -TTE showed normal mitral valve, aortic valve sclerosis, tricuspid valve normal in structure, pulmonic valve normal in structure -Initially plan to treat empirically for endocarditis/osteomyelitis, TEE was deferred -General surgery saw patient, s/p bedside debridement of the sacral decubitus and right lower extremity wound -Continued comfort care status with anticipation for hospital death   Acute pulmonary embolism -Initially on heparin GGT, stopped for comfort measures -Echocardiogram did not show RV  strain -Lower extremity venous duplex obtained was positive for bilateral lower extremity DVTs   Metabolic encephalopathy -Likely in setting of septic shock as above -Continue comfort measures   Goals of care -Patient's wife discussed with Dr. Lowell Guitar, patient was made comfort care.  Plan was to go home with hospice, however patient's wife expressed desire that patient can stay in the hospital for end-of-life care.  Palliative care following.  Started on morphine gtt. Anticipating hospital death      Subjective: Unable to assess given mentation  Physical Exam: Vitals:   12/30/22 0650 12/31/22 0635 01/01/23 0659 01/02/23 0443  BP: (!) 79/44 (!) 68/43 (!) 72/46 (!) 60/40  Pulse: 96 (!) 102 94 (!) 105  Resp: (!) 4 (!) 9 (!) 8 12  Temp: 99.2 F (37.3 C) 98.3 F (36.8 C) 97.6 F (36.4 C) 98.8 F (37.1 C)  TempSrc: Oral Oral Oral Oral  SpO2: 92% (!) 63% (!) 77%   Weight:      Height:       General exam: Asleep, laying in bed, in nad Respiratory system: Normal respiratory effort, no audible wheezing Data Reviewed:  There are no new results to review at this time.  Family Communication: Pt in room, family not at bedside  Disposition: Status is: Inpatient Remains inpatient appropriate because: severity of illness  Planned Discharge Destination:  Anticipate hospital death     Author: Rickey Barbara, MD 01/02/2023 5:03 PM  For on call review [email or web address].

## 2023-01-02 NOTE — Plan of Care (Signed)

## 2023-01-02 NOTE — Progress Notes (Signed)
Daily Progress Note   Patient Name: Edwin [name]       Date: 01/02/2023 DOB: 02/09/1934  Age: 89 y.o. MRN#: [id number] Attending Physician: Jerald Kief, MD Primary Care Physician: [name], DO Admit Date: 12/12/2022 Length of Stay: 15 days  Reason for Consultation/Follow-up: Establishing goals of care and symptom management  Subjective:   CC: Patient unresponsive when seen. No family at bedside.  Continuing end of life care.   Subjective:  Reviewed EMR prior to presenting to bedside. Patient continues on IV morphine infusion 4mg /hr.  Discussed care with bedside RN for updates. No oral intake. Minimal urine output. Continues to have apneic pauses. BP noted to be 60/40 this morning.  Patient examined in bed. Appears comfortable. Unresponsive to voice/touch. No family present at bedside.   Objective:   Vital Signs:  BP (!) 60/40 (BP Location: Right Arm)   Pulse (!) 105   Temp 98.8 F (37.1 C) (Oral)   Resp 12   Ht 5\' 11"  (1.803 m)   Wt 64.3 kg   SpO2 (!) 77%   BMI 19.77 kg/m   Physical Exam: General: cachetic, frail, laying in bed, unresponsive Eyes: No drainage noted HENT: secretions heard Cardiovascular: RRR Respiratory: periods of apnea Extremities: Multiple wounds present with bandages in place Neuro: Unresponsive  I personally reviewed recent imaging.   Assessment & Plan:   Assessment: Patient is an 89 year old male with a past medical history of dementia, right hip fracture/ME arthroplasty 1223, bed ridden, and nonverbal at baseline who was admitted on 12/08/2022 for management of ongoing cough.  During hospitalization patient initially managed for septic shock due to polymicrobial bacteremia, acute pulmonary embolism, and metabolic encephalopathy.  During hospitalization after conversations with patient's family, patient transition to comfort focused care on 12/21/2022.  Palliative medicine team consulted to assist with complex medical decision making  and symptom management.  Recommendations/Plan: # Complex medical decision making/goals of care:  - Patient unable to participate in complex medical decision-making due to medical status.  -Patient was transition to full comfort focused care on 12/21/2022 after hospitalist discussed care with patient's wife.  Continuing comfort focused care only at this time to maintain symptom management and dignity at the end of life. Care plan has been discussed with wife.  (Wife does NOT want to transfer patient to inpatient hospice and could not support home hospice care needs).  Anticipate in-hospital death.  -  Code Status: DNR  # Symptom management  -Pain/Dyspnea, acute in the setting of end-of-life care -Continue IV morphine continuous  infusion at 2-5mg /hr titration with RN bolus from infusion at 2mg  q30 mins prn for breakthrough pain/dyspnea. Currently at 4mg /hr continuous infusion.                  -Anxiety/agitation, in the setting of end-of-life care                               -Continue IV diazepam to 2.5-5mg  q4hrs prn. Continue to adjust based on patient's symptom burden.                   -Secretions, in the setting of end-of-life care                               - Continue IV glycopyrrolate 0.2 mg scheduled every 6 hours.  # Psychosocial Support:  -Wife  #  Discharge Planning: Anticipated Hospital Death   Thank you for allowing the palliative care team to participate in the care [facility].  Alvester Morin, DO Palliative Care Provider PMT # [phone]  If patient remains symptomatic despite maximum doses, please call PMT at [phone] between 0700 and 1900. Outside of these hours, please call attending, as PMT does not have night coverage.  *Please note that this is a verbal dictation therefore any spelling or grammatical errors are due to the "Dragon Medical One" system interpretation.

## 2023-01-03 DIAGNOSIS — R6521 Severe sepsis with septic shock: Secondary | ICD-10-CM | POA: Diagnosis not present

## 2023-01-03 DIAGNOSIS — I2699 Other pulmonary embolism without acute cor pulmonale: Secondary | ICD-10-CM | POA: Diagnosis not present

## 2023-01-03 DIAGNOSIS — A419 Sepsis, unspecified organism: Secondary | ICD-10-CM | POA: Diagnosis not present

## 2023-01-03 DIAGNOSIS — L89899 Pressure ulcer of other site, unspecified stage: Secondary | ICD-10-CM | POA: Diagnosis not present

## 2023-01-07 NOTE — Progress Notes (Signed)
    OVERNIGHT PROGRESS REPORT  Notified by RN that patient is deceased as of 01/16/53 hrs  Patient was DNR/CMO (comfort measure only) followed by Palliative.  2 RN verified.   Chinita Greenland MSNA ACNPC-AG Acute Care Nurse Practitioner [facility]

## 2023-01-07 NOTE — Progress Notes (Signed)
   01-09-23 2099-01-15  Attending Physican Contact  Attending Physician Notified Y  Attending Physician (First and Last Name) Rickey Barbara, MD  Post Mortem Checklist  Date of Death Jan 09, 2023  Time of Death Jan 15, 2053  Pronounced By Mendel Corning RN, Johnnette Gourd RN  Next of kin notified Yes  Name of next of kin notified of death Jacque [place]  Contact Person's Relationship to Patient Spouse  Contact Person's Phone Number [phone]  Contact Person's address [address] [place] [address]  Family Communication Notes made aware, upset with patients care  Was the patient a No Code Blue or a Limited Code Blue? Yes  Did the patient die unattended? Yes  Patient restrained? Not applicable  Height 5\' 11"  (1.803 m)  Weight 64.3 kg  Body preparation complete Y  [employer] (previously known as Administrator, sports)  Notification Date 01/09/2023  Notification Time 2135-01-16  [employer] Number [phone]  Is patient a potential donor? N  Autopsy  Autopsy requested by MD or Family ( Non ME Case) N/A  Patient and Hospital Property Returned  Patient is satisfied that all belongings have been returned? Not applicable  Dermatherapy linen/gowns NOT sent with patient or transporter Not applicable  Dead on Arrival (Emergency Department)  Patient dead on arrival? No  Notifications  Patient Placement notified that Post Mortem checklist is complete Yes  Medical Examiner  Is this a medical examiner's case? N  Funeral Home  Funeral home name/address/phone # [employer] - [phone]  Planned location of pickup [place]

## 2023-01-07 NOTE — Progress Notes (Signed)
Spoke with family member who was upset about resident being wet. Patient was cleaned up & bed change completed. Explained that he has a male purewick & rectal tube in & that the soiled area was drainage from his decubitus wound on his sacrum. Sacrum foam pad replaced. Explained focus at this time is on comfort, anything that would be discomforting to patient is held at this time.

## 2023-01-07 NOTE — Progress Notes (Signed)
  Progress Note   Patient: Edwin [name] WUJ:[id number] DOB: [birth year] DOA: 12/08/2022     16 DOS: the patient was seen and examined on 12/26/2022   Brief hospital course: 89 year old man with history of dementia, right hip fracture/hemiarthroplasty 05/2022, bedridden and nonverbal at baseline. Family is able to assist him into a wheelchair intermittently and he has home health assistance. He has reportedly had ongoing cough for a few weeks. There was concern that he was worsening and EMS was called. He was admitted for sepsis. Found to have polymicrobial bacteremia, osteomyelitis, decubitus ulcers, possible endocarditis. After discussion with wife, now planning for comfort measures with hospice.   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 Prior hip arthroplasty with chronic periprosthetic fracture. Sacral decubitus ulcer overlying the sacrum and coccyx, loss of cortical definition of posterior cortex of distal most sacral segment suggestive of osteomyelitis  -Cultures grew E. coli, Enterococcus faecalis, Morganella morganii -ID consulted, concern for endocarditis -TTE showed normal mitral valve, aortic valve sclerosis, tricuspid valve normal in structure, pulmonic valve normal in structure -Initially plan to treat empirically for endocarditis/osteomyelitis, TEE was deferred -General surgery saw patient, s/p bedside debridement of the sacral decubitus and right lower extremity wound -Continued comfort care status with anticipation for hospital death   Acute pulmonary embolism -Initially on heparin GGT, stopped for comfort measures -Echocardiogram did not show RV  strain -Lower extremity venous duplex obtained was positive for bilateral lower extremity DVTs   Metabolic encephalopathy -Likely in setting of septic shock as above -Continue comfort measures   Goals of care -Patient's wife discussed with Dr. Lowell Guitar, patient was made comfort care.  Plan was to go home with hospice, however patient's wife expressed desire that patient can stay in the hospital for end-of-life care.  Palliative care following.  Started on morphine gtt. Still anticipating hospital death      Subjective: Cannot assess given mentation  Physical Exam: Vitals:   01/01/23 0659 01/02/23 0443 01/02/23 2022 12/14/2022 0542  BP: (!) 72/46 (!) 60/40 (!) 111/48 (!) 63/43  Pulse: 94 (!) 105 69 60  Resp: (!) 8 12 14 16   Temp: 97.6 F (36.4 C) 98.8 F (37.1 C) 98.9 F (37.2 C) 97.6 F (36.4 C)  TempSrc: Oral Oral Oral Oral  SpO2: (!) 77%  93% (!) 86%  Weight:      Height:        General exam: Not conversant, in no acute distress Respiratory system: normal chest rise, clear, no audible wheezing  Data Reviewed:  There are no new results to review at this time.  Family Communication: Pt in room, family not at bedside  Disposition: Status is: Inpatient Remains inpatient appropriate because: severity of illness  Planned Discharge Destination:  Anticipate hospital death     Author: Rickey Barbara, MD 01/01/2023 5:37 PM  For on call review [email or web address].

## 2023-01-07 NOTE — Discharge Summary (Signed)
Death Summary  Edwin [name] UJW:[id number] DOB: Oct 05, 1933 DOA: 01-16-23  PCP: [name], DO  Admit date: 2023/01/16 Date of Death: Feb 02, 2023 Time of Death: 02/07/53 Notification: [name], DO notified of death of 02/02/2023   History of present illness:  89 year old man with history of dementia, right hip fracture/hemiarthroplasty 05/2022, bedridden and nonverbal at baseline. Family is able to assist him into a wheelchair intermittently and he has home health assistance. He has reportedly had ongoing cough for a few weeks. There was concern that he was worsening and EMS was called. He was admitted for sepsis. Found to have polymicrobial bacteremia, osteomyelitis, decubitus ulcers, possible endocarditis. After discussion with wife, decision was made to transition to comfort measures with hospice.    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 Prior hip arthroplasty with chronic periprosthetic fracture. Sacral decubitus ulcer overlying the sacrum and coccyx, loss of cortical definition of posterior cortex of distal most sacral segment suggestive of osteomyelitis  -Cultures grew E. coli, Enterococcus faecalis, Morganella morganii -ID consulted, concern for endocarditis -TTE showed normal mitral valve, aortic valve sclerosis, tricuspid valve normal in structure, pulmonic valve normal in structure -Initially plan to treat empirically for endocarditis/osteomyelitis, TEE was deferred -General surgery saw patient, s/p bedside debridement of the sacral decubitus and right lower extremity wound -Patient was continued with comfort measures. Pt was later pronounced on  02/01/23 at 2053-02-07   Acute pulmonary embolism -Initially on heparin GGT, stopped for comfort measures -Echocardiogram did not show RV strain -Lower extremity venous duplex obtained was positive for bilateral lower extremity DVTs   Metabolic encephalopathy -Likely in setting of septic shock as above -Continued on comfort measures   Goals of care -Patient's wife discussed with Dr. Lowell Guitar, patient was made comfort care.  Plan was to go home with hospice, however patient's wife expressed desire that patient can stay in the hospital for end-of-life care.  Palliative care following.  Had continued on morphine gtt. Patient passed on 02/01/2023 at 02/07/53     The results of significant diagnostics from this hospitalization (including imaging, microbiology, ancillary and laboratory) are listed below for reference.    Significant Diagnostic Studies: VAS [place] LOWER EXTREMITY VENOUS (DVT)  Result Date: 12/21/2022  Lower Venous DVT Study Patient Name:  Edwin [name]  Date of Exam:   12/21/2022 Medical Rec #: [id number]        Accession #:    [id number] Date of Birth: 01/11/1934        Patient Gender: M Patient Age:   66 years Exam Location:  [facility] Procedure:      VAS [place] LOWER EXTREMITY VENOUS (DVT) Referring Phys: A POWELL JR --------------------------------------------------------------------------------  Indications: Pulmonary embolism. Other Indications: Edema, LT>RT. Anticoagulation: Heparin. Limitations: Patient positioning, bandages at the right calf. Comparison Study: No prior studies. Performing Technologist: Jean Rosenthal RDMS, RVT  Examination Guidelines: A complete evaluation includes B-mode imaging, spectral Doppler, color Doppler, and power Doppler as needed of all accessible portions of each vessel. Bilateral testing is considered an integral part of a complete examination. Limited examinations for reoccurring indications may be performed as noted. The reflux portion of the exam is performed with  the patient in reverse Trendelenburg.  +---------+---------------+---------+-----------+----------+-------------------+ RIGHT    CompressibilityPhasicitySpontaneityPropertiesThrombus  Aging      +---------+---------------+---------+-----------+----------+-------------------+ CFV      Partial        Yes      Yes                  Acute               +---------+---------------+---------+-----------+----------+-------------------+ SFJ      Partial        Yes      Yes                  Acute               +---------+---------------+---------+-----------+----------+-------------------+ FV Prox  Partial        Yes      Yes                  Acute               +---------+---------------+---------+-----------+----------+-------------------+ FV Mid   Full                                                             +---------+---------------+---------+-----------+----------+-------------------+ FV DistalPartial        Yes      Yes                  Age Indeterminate   +---------+---------------+---------+-----------+----------+-------------------+ PFV      Full                                                             +---------+---------------+---------+-----------+----------+-------------------+ POP      Full                                                             +---------+---------------+---------+-----------+----------+-------------------+ PTV      Full                                         Technically limited +---------+---------------+---------+-----------+----------+-------------------+ PERO     Full                                         Technically limited +---------+---------------+---------+-----------+----------+-------------------+   +---------+---------------+---------+-----------+----------+-----------------+ LEFT     CompressibilityPhasicitySpontaneityPropertiesThrombus Aging     +---------+---------------+---------+-----------+----------+-----------------+ CFV      None           No       No                   Acute             +---------+---------------+---------+-----------+----------+-----------------+ SFJ      None           No  No                   Acute             +---------+---------------+---------+-----------+----------+-----------------+ FV Prox  None           No       No                   Acute             +---------+---------------+---------+-----------+----------+-----------------+ FV Mid   None           No       No                   Acute             +---------+---------------+---------+-----------+----------+-----------------+ FV DistalPartial        Yes      Yes                  Age Indeterminate +---------+---------------+---------+-----------+----------+-----------------+ PFV      None           No       No                   Acute             +---------+---------------+---------+-----------+----------+-----------------+ POP      Partial        Yes      Yes                  Age Indeterminate +---------+---------------+---------+-----------+----------+-----------------+ PTV      Full                                                           +---------+---------------+---------+-----------+----------+-----------------+ PERO     Partial        Yes      Yes                  Age Indeterminate +---------+---------------+---------+-----------+----------+-----------------+     Summary: RIGHT: - Findings consistent with acute deep vein thrombosis involving the right common femoral vein, SF junction, and right femoral vein. - Findings consistent with age indeterminate deep vein thrombosis involving the distal right femoral vein. - No cystic structure found in the popliteal fossa.  LEFT: - Findings consistent with acute deep vein thrombosis involving the left common femoral vein, SF junction, left femoral vein, and  left proximal profunda vein. - Findings consistent with age indeterminate deep vein thrombosis involving the distal left femoral vein, left popliteal vein, and left peroneal veins. - No cystic structure found in the popliteal fossa. - Possible obstruction proximal to the inguinal ligament.  *See table(s) above for measurements and observations. Electronically signed by Lemar Livings MD on 12/21/2022 at 7:09:19 PM.    Final    CT FEMUR RIGHT WO CONTRAST  Result Date: 12/19/2022 CLINICAL DATA:  Right leg infection. Evaluate for necrotizing fasciitis. EXAM: CT OF THE LOWER RIGHT EXTREMITY WITHOUT CONTRAST TECHNIQUE: Multidetector CT imaging of the right thigh and lower leg was performed according to the standard protocol. RADIATION DOSE REDUCTION: This exam was performed according to the departmental dose-optimization program which includes automated exposure control, adjustment of the mA and/or kV according to patient size and/or use  of iterative reconstruction technique. COMPARISON:  None Available. FINDINGS: Bones/Joint/Cartilage Prior right hip arthroplasty. 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 The distal aspect of the fracture remains nonunited. No new fracture or dislocation. Mild periosteal reaction along the lateral aspect of the mid to distal fibular diaphysis (series 7, image 203). Degenerative changes of the right knee. Symmetric trace to small bilateral knee joint effusions. Ligaments Ligaments are suboptimally evaluated by CT. Muscles and Tendons Grossly intact. Soft tissue Right thigh and lower leg soft tissue swelling. 6.6 cm soft tissue ulcer along the lateral aspect of the mid to distal lower leg with a few tiny foci of subcutaneous emphysema. The ulcer extends close to the outer cortex of the fibular shaft. No fluid collection or hematoma. No soft tissue mass. Please see same day CT abdomen pelvis report for  intrapelvic findings. IMPRESSION: 1. 6.6 cm soft tissue ulcer along the lateral aspect of the mid to distal lower leg extending close to the underlying fibular diaphysis, with mild fibular periosteal reaction concerning for osteomyelitis. 2. No evidence of necrotizing fasciitis.  No definite abscess. 3. 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 The distal aspect of the fracture remains nonunited. Electronically Signed   By: Obie Dredge M.D.   On: 12/19/2022 12:13   CT TIBIA FIBULA RIGHT WO CONTRAST  Result Date: 12/19/2022 CLINICAL DATA:  Right leg infection. Evaluate for necrotizing fasciitis. EXAM: CT OF THE LOWER RIGHT EXTREMITY WITHOUT CONTRAST TECHNIQUE: Multidetector CT imaging of the right thigh and lower leg was performed according to the standard protocol. RADIATION DOSE REDUCTION: This exam was performed according to the departmental dose-optimization program which includes automated exposure control, adjustment of the mA and/or kV according to patient size and/or use of iterative reconstruction technique. COMPARISON:  None Available. FINDINGS: Bones/Joint/Cartilage Prior right hip arthroplasty. 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 The distal aspect of the fracture remains nonunited. No new fracture or dislocation. Mild periosteal reaction along the lateral aspect of the mid to distal fibular diaphysis (series 7, image 203). Degenerative changes of the right knee. Symmetric trace to small bilateral knee joint effusions. Ligaments Ligaments are suboptimally evaluated by CT. Muscles and Tendons Grossly intact. Soft tissue Right thigh and lower leg soft tissue swelling. 6.6 cm soft tissue ulcer along the lateral aspect of the mid to distal lower leg with a few tiny foci of subcutaneous emphysema. The ulcer  extends close to the outer cortex of the fibular shaft. No fluid collection or hematoma. No soft tissue mass. Please see same day CT abdomen pelvis report for intrapelvic findings. IMPRESSION: 1. 6.6 cm soft tissue ulcer along the lateral aspect of the mid to distal lower leg extending close to the underlying fibular diaphysis, with mild fibular periosteal reaction concerning for osteomyelitis. 2. No evidence of necrotizing fasciitis.  No definite abscess. 3. 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 The distal aspect of the fracture remains nonunited. Electronically Signed   By: Obie Dredge M.D.   On: 12/19/2022 12:13   CT ABDOMEN PELVIS WO CONTRAST  Result Date: 12/19/2022 CLINICAL DATA:  decubitus ulcer, osteomyelitis EXAM: CT ABDOMEN AND PELVIS WITHOUT CONTRAST TECHNIQUE: Multidetector CT imaging of the abdomen and pelvis was performed following the standard protocol without IV contrast. RADIATION  DOSE REDUCTION: This exam was performed according to the departmental dose-optimization program which includes automated exposure control, adjustment of the mA and/or kV according to patient size and/or use of iterative reconstruction technique. COMPARISON:  CT chest 01/05/2023. FINDINGS: Lower chest: Trace left pleural effusion with left greater than right bibasilar airspace opacities, slightly progressed. Small-moderate pericardial effusion, unchanged. Hepatobiliary: Two cysts in the right hepatic lobe, largest measuring up to 3.0 cm. Otherwise unremarkable unenhanced appearance of the liver. Layering hyperdense material within the gallbladder is likely related to vicarious contrast excretion. Pancreas: Unremarkable. No pancreatic ductal dilatation or surrounding inflammatory changes. Spleen: Normal in size without focal abnormality. Adrenals/Urinary Tract: Unremarkable adrenal glands. Bilateral  renal cysts, which do not require follow-up imaging. There is contrast excretion within the bilateral renal collecting systems and urinary bladder. No hydronephrosis. Stomach/Bowel: Stomach within normal limits. No dilated loops of bowel to suggest obstruction. Extensive diverticulosis. No focal bowel wall thickening is evident. A rectal tube is in place. Vascular/Lymphatic: Scattered aortoiliac atherosclerotic calcifications without aneurysm. No abdominopelvic lymphadenopathy. Reproductive: Prostate is unremarkable. Other: No free air or free fluid.  Mild mesenteric edema. Musculoskeletal: Sacral decubitus ulcer overlying the distal sacrum and coccyx. Loss of cortical definition of the posterior cortex of the distal most sacral segment suspicious for osteomyelitis (series 2, image 69). There is soft tissue edema and numerous foci of air within the soft tissues adjacent to the ulceration site. No organized or drainable fluid collections. Ankylosis of the imaged thoracolumbar spine. Large anterior endplate osteophyte at L3-L4. Both SI joints are fused. No acute fractures are identified. Prior right total hip arthroplasty with proximal cerclage wires. IMPRESSION: 1. Sacral decubitus ulcer overlying the distal sacrum and coccyx. Loss of cortical definition of the posterior cortex of the distal-most sacral segment suggestive of osteomyelitis. There is soft tissue edema and numerous foci of air within the soft tissues adjacent to the ulceration site. No organized or drainable fluid collections. 2. Trace left pleural effusion with left greater than right bibasilar airspace opacities, slightly progressed. 3. Small-moderate pericardial effusion, unchanged. 4. Extensive diverticulosis without evidence of acute diverticulitis. 5. 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 6. Aortic atherosclerosis (ICD10-I70.0). Electronically Signed   By: Duanne Guess D.O.   On: 12/19/2022 12:05   ECHOCARDIOGRAM  COMPLETE  Result Date: 12/19/2022    ECHOCARDIOGRAM REPORT   Patient Name:   Edwin [name] Date of Exam: 12/19/2022 Medical Rec #:  [id number]       Height:       71.0 in Accession #:    [id number]      Weight:       129.4 lb Date of Birth:  [birth year]       BSA:          1.752 m Patient Age:    88 years        BP:           111/60 mmHg Patient Gender: M               HR:           121 bpm. Exam Location:  Inpatient Procedure: 2D Echo, Cardiac Doppler and Color Doppler Indications:    Pulmonary Embolus  History:        Patient has no prior history of Echocardiogram examinations.                 Arrythmias:Tachycardia, Signs/Symptoms:cough; Risk  Factors:sepsis, LE ulcers.  Sonographer:    Wallie Char Referring Phys: 42 ROBERT S BYRUM  Sonographer Comments: Technically challenging study due to limited acoustic windows. IMPRESSIONS  1. Left ventricular ejection fraction, by estimation, is 55 to 60%. The left ventricle has normal function. The left ventricle has no regional wall motion abnormalities. Left ventricular diastolic parameters are consistent with Grade I diastolic dysfunction (impaired relaxation).  2. Right ventricular systolic function is normal. The right ventricular size is normal. Tricuspid regurgitation signal is inadequate for assessing PA pressure.  3. Moderate pericardial effusion. The pericardial effusion is anterior to the right ventricle.  4. The mitral valve is normal in structure. No evidence of mitral valve regurgitation. No evidence of mitral stenosis.  5. The aortic valve is tricuspid. There is mild calcification of the aortic valve. Aortic valve regurgitation is not visualized. Aortic valve sclerosis/calcification is present, without any evidence of aortic stenosis.  6. The inferior vena cava is normal in size with greater than 50% respiratory variability, suggesting right atrial pressure of 3 mmHg. Conclusion(s)/Recommendation(s): There is a moderate-sized anterior  pericardial effusion without evidence of overt tamponade. there is no RV strain. FINDINGS  Left Ventricle: Left ventricular ejection fraction, by estimation, is 55 to 60%. The left ventricle has normal function. The left ventricle has no regional wall motion abnormalities. The left ventricular internal cavity size was normal in size. There is  no left ventricular hypertrophy. Left ventricular diastolic parameters are consistent with Grade I diastolic dysfunction (impaired relaxation). Right Ventricle: The right ventricular size is normal. No increase in right ventricular wall thickness. Right ventricular systolic function is normal. Tricuspid regurgitation signal is inadequate for assessing PA pressure. Left Atrium: Left atrial size was normal in size. Right Atrium: Right atrial size was normal in size. Pericardium: A moderately sized pericardial effusion is present. The pericardial effusion is anterior to the right ventricle. Mitral Valve: The mitral valve is normal in structure. No evidence of mitral valve regurgitation. No evidence of mitral valve stenosis. MV peak gradient, 1.9 mmHg. The mean mitral valve gradient is 1.0 mmHg. Tricuspid Valve: The tricuspid valve is normal in structure. Tricuspid valve regurgitation is trivial. No evidence of tricuspid stenosis. Aortic Valve: The aortic valve is tricuspid. There is mild calcification of the aortic valve. Aortic valve regurgitation is not visualized. Aortic valve sclerosis/calcification is present, without any evidence of aortic stenosis. Aortic valve mean gradient measures 2.0 mmHg. Aortic valve peak gradient measures 2.4 mmHg. Aortic valve area, by VTI measures 2.81 cm. Pulmonic Valve: The pulmonic valve was normal in structure. Pulmonic valve regurgitation is not visualized. No evidence of pulmonic stenosis. Aorta: The aortic root is normal in size and structure. Venous: The inferior vena cava is normal in size with greater than 50% respiratory variability,  suggesting right atrial pressure of 3 mmHg. IAS/Shunts: No atrial level shunt detected by color flow Doppler.  LEFT VENTRICLE PLAX 2D LVIDd:         4.60 cm     Diastology LVIDs:         3.40 cm     LV e' medial:    6.73 cm/s LV PW:         1.00 cm     LV E/e' medial:  9.6 LV IVS:        0.70 cm     LV e' lateral:   7.50 cm/s LVOT diam:     2.30 cm     LV E/e' lateral: 8.6 LV SV:  50 LV SV Index:   28 LVOT Area:     4.15 cm  LV Volumes (MOD) LV vol d, MOD A2C: 45.0 ml LV vol d, MOD A4C: 58.9 ml LV vol s, MOD A2C: 20.3 ml LV vol s, MOD A4C: 27.6 ml LV SV MOD A2C:     24.7 ml LV SV MOD A4C:     58.9 ml LV SV MOD BP:      29.1 ml RIGHT VENTRICLE             IVC RV Basal diam:  3.80 cm     IVC diam: 1.60 cm RV S prime:     26.10 cm/s TAPSE (M-mode): 2.0 cm LEFT ATRIUM           Index        RIGHT ATRIUM           Index LA diam:      3.20 cm 1.83 cm/m   RA Area:     14.70 cm LA Vol (A4C): 20.7 ml 11.81 ml/m  RA Volume:   36.40 ml  20.77 ml/m  AORTIC VALVE AV Area (Vmax):    2.83 cm AV Area (Vmean):   2.48 cm AV Area (VTI):     2.81 cm AV Vmax:           78.00 cm/s AV Vmean:          58.600 cm/s AV VTI:            0.177 m AV Peak Grad:      2.4 mmHg AV Mean Grad:      2.0 mmHg LVOT Vmax:         53.20 cm/s LVOT Vmean:        35.000 cm/s LVOT VTI:          0.120 m LVOT/AV VTI ratio: 0.68  AORTA Ao Root diam: 3.60 cm MITRAL VALVE               TRICUSPID VALVE MV Area (PHT): 3.39 cm    TR Peak grad:   27.0 mmHg MV Area VTI:   3.30 cm    TR Vmax:        260.00 cm/s MV Peak grad:  1.9 mmHg MV Mean grad:  1.0 mmHg    SHUNTS MV Vmax:       0.68 m/s    Systemic VTI:  0.12 m MV Vmean:      44.6 cm/s   Systemic Diam: 2.30 cm MV Decel Time: 224 msec MV E velocity: 64.60 cm/s MV A velocity: 66.80 cm/s MV E/A ratio:  0.97 Arvilla Meres MD Electronically signed by Arvilla Meres MD Signature Date/Time: 12/19/2022/11:29:18 AM    Final    CT Angio Chest PE W and/or Wo Contrast  Result Date: 12/14/2022 CLINICAL  DATA:  Pulmonary embolism suspected. High probability. Shortness of breath and cough. EXAM: CT ANGIOGRAPHY CHEST WITH CONTRAST TECHNIQUE: Multidetector CT imaging of the chest was performed using the standard protocol during bolus administration of intravenous contrast. Multiplanar CT image reconstructions and MIPs were obtained to evaluate the vascular anatomy. RADIATION DOSE REDUCTION: This exam was performed according to the departmental dose-optimization program which includes automated exposure control, adjustment of the mA and/or kV according to patient size and/or use of iterative reconstruction technique. CONTRAST:  75mL OMNIPAQUE IOHEXOL 350 MG/ML SOLN COMPARISON:  Portable chest today portable chest 11/29/2022. No prior cross-sectional imaging for comparison. FINDINGS: Cardiovascular: The pulmonary trunk is prominent measuring 3.3 cm,  and there is also elevated RV/LV ratio of 1.2 consistent with right heart dysfunction/strain. There are no central arterial clots, no large emboli. There is a small thrombus in a posterior basal right lower lobe segmental artery on 4: 97-100. On the left there are subsegmental lingular emboli on 4: 83-84 and 4:94-100. In the left lower lobe there is a lateral basal subsegmental embolus on 4: 100-104 with extension into the downstream small arteries in this distribution. No further emboli are seen. The heart is mildly enlarged. Pericardial effusion noted circumferentially up to maximum 2 cm in thickness to the right. The pulmonary veins are normal caliber. The great vessels are clear and branch normally. There is mild aortic atherosclerosis and tortuosity, borderline dilatation of the aortic root at the sinuses of Valsalva measuring 3.9 cm, and a slightly dilated ascending aorta measuring 4.2 cm. There is no aortic or great vessel dissection or stenosis. There are no visible coronary calcific plaques. There are small air pockets in both internal jugular veins, most likely  either injected at the time of contrast injection or with placement of the patient's IV. There is no central air embolus. Mediastinum/Nodes: There is a heterogeneous 1 cm nodule in the left lobe of the thyroid gland.No follow-up imaging is recommended. Reference: J Am Coll Radiol. 2015 Feb;12(2): 143-50. There is no supraclavicular, axillary or intrathoracic adenopathy. Thoracic esophagus, both main bronchi and thoracic trachea are unremarkable. Lungs/Pleura: Minimal centrilobular and paraseptal emphysematous change both lung apices. Diffuse bronchial thickening. Scattered subsegmental bronchial impactions are noted in the posterior basal left lower lobe with patchy consolidation which is most likely due to bronchopneumonia although component of infarct is possible. There is mild posterior atelectasis in the right lower lobe. No bronchial impactions are seen on the right. The lungs are otherwise clear. There is a small posteromedial right diaphragmatic fat herniation into the lower chest. There are trace pleural effusions. No pneumothorax or pleural thickening. Upper Abdomen: No acute radiographic findings. Streak artifact limits fine detail due to the patient's arms in the field. There is a 3.2 cm simple appearing cyst in the superior pole of the right kidney with a Hounsfield density of 11.4. No follow-up imaging is recommended. In hepatic segment 5 there is an indeterminate hypodense lesion measuring 3.2 cm with appearance of vessels at its periphery, possibly a hemangioma but further evaluation is recommended. Musculoskeletal: There is multilevel bridging enthesopathy of the thoracic spine. No spinal compression fracture or other acute or significant osseous findings. The ribcage is intact. No chest wall mass. Review of the MIP images confirms the above findings. IMPRESSION: 1. Positive for acute PE with CT findings of right heart strain (RV/LV Ratio = 1.2 ) consistent with at least submassive (intermediate  risk) PE, although the visible clot burden is small. The presence of right heart strain has been associated with an increased risk of morbidity and mortality. Please refer to the "Code PE Focused" order set in EPIC. 2. Cardiomegaly with pericardial effusion. 3. Aortic atherosclerosis with borderline dilatation of the aortic root at the sinuses of Valsalva, and slightly dilated ascending aorta at 4.2 cm. No dissection or stenosis. Recommend annual imaging followup by CTA or MRA. This recommendation follows 2010 ACCF/AHA/AATS/ACR/ASA/SCA/SCAI/SIR/STS/SVM Guidelines for the Diagnosis and Management of Patients with Thoracic Aortic Disease. Circulation. 2010; 121: W098-J191. Aortic aneurysm NOS (ICD10-I71.9) 4. Bronchitis with scattered subsegmental bronchial impactions or aspiration in the posterior basal left lower lobe, with patchy consolidation which is most likely due to bronchopneumonia although component of infarct  is possible. Follow-up study recommended after treatment to ensure clearing. 5. Trace pleural effusions. 6. Indeterminate 3.2 cm hypodense lesion in hepatic segment 5. Follow-up nonemergent MRI or CT without and with contrast recommended. 7. Small air pockets in both internal jugular veins, most likely either injected at the time of contrast injection or with placement of the patient's IV. 8. Emphysema. 9. Critical Value/emergent results were called by telephone at the time of interpretation on 12/28/2022 at 10:23 pm to provider [facility] , who verbally acknowledged these results. Aortic Atherosclerosis (ICD10-I70.0) and Emphysema (ICD10-J43.9). Electronically Signed   By: Almira Bar M.D.   On: 01/04/2023 22:32   DG Chest Port 1 View  Result Date: 12/09/2022 CLINICAL DATA:  Shortness of breath and cough EXAM: PORTABLE CHEST 1 VIEW COMPARISON:  Radiograph 11/29/2022 FINDINGS: Stable cardiomediastinal silhouette. No focal consolidation, pleural effusion, or pneumothorax. No displaced rib  fractures. Advanced arthritis both shoulders. IMPRESSION: No active disease. Electronically Signed   By: Minerva Fester M.D.   On: 12/07/2022 18:02    Microbiology: No results found for this or any previous visit (from the past 240 hour(s)).   Labs: Basic Metabolic Panel: No results for input(s): "NA", "K", "CL", "CO2", "GLUCOSE", "BUN", "CREATININE", "CALCIUM", "MG", "PHOS" in the last 168 hours. Liver Function Tests: No results for input(s): "AST", "ALT", "ALKPHOS", "BILITOT", "PROT", "ALBUMIN" in the last 168 hours. No results for input(s): "LIPASE", "AMYLASE" in the last 168 hours. No results for input(s): "AMMONIA" in the last 168 hours. CBC: No results for input(s): "WBC", "NEUTROABS", "HGB", "HCT", "MCV", "PLT" in the last 168 hours. Cardiac Enzymes: No results for input(s): "CKTOTAL", "CKMB", "CKMBINDEX", "TROPONINI" in the last 168 hours. D-Dimer No results for input(s): "DDIMER" in the last 72 hours. BNP: Invalid input(s): "POCBNP" CBG: No results for input(s): "GLUCAP" in the last 168 hours. Anemia work up No results for input(s): "VITAMINB12", "FOLATE", "FERRITIN", "TIBC", "IRON", "RETICCTPCT" in the last 72 hours. Urinalysis    Component Value Date/Time   COLORURINE AMBER (A) 08/05/2021 1942   APPEARANCEUR CLOUDY (A) 08/05/2021 1942   LABSPEC 1.013 08/05/2021 1942   PHURINE 6.0 08/05/2021 1942   GLUCOSEU NEGATIVE 08/05/2021 1942   HGBUR NEGATIVE 08/05/2021 1942   BILIRUBINUR NEGATIVE 08/05/2021 1942   KETONESUR NEGATIVE 08/05/2021 1942   PROTEINUR NEGATIVE 08/05/2021 1942   NITRITE NEGATIVE 08/05/2021 1942   LEUKOCYTESUR NEGATIVE 08/05/2021 1942   Sepsis Labs No results for input(s): "WBC" in the last 168 hours.  Invalid input(s): "PROCALCITONIN", "LACTICIDVEN"    SIGNED:  Rickey Barbara, MD  [facility] 01/04/2023, 7:17 PM  If 7PM-7AM, please contact night-coverage [email or web address] Password [email or web address]

## 2023-01-07 DEATH — deceased
